# Patient Record
Sex: Male | Born: 1985 | Race: White | Hispanic: Yes | Marital: Single | Smoking: Never smoker
Health system: Southern US, Community
[De-identification: ages and names within clinical notes are randomized; demographics above are authoritative.]

## PROBLEM LIST (undated history)

## (undated) DIAGNOSIS — L709 Acne, unspecified: Secondary | ICD-10-CM

## (undated) DIAGNOSIS — D696 Thrombocytopenia, unspecified: Secondary | ICD-10-CM

## (undated) DIAGNOSIS — L270 Generalized skin eruption due to drugs and medicaments taken internally: Secondary | ICD-10-CM

## (undated) DIAGNOSIS — K760 Fatty (change of) liver, not elsewhere classified: Secondary | ICD-10-CM

## (undated) HISTORY — DX: Fatty (change of) liver, not elsewhere classified: K76.0

## (undated) HISTORY — DX: Acne, unspecified: L70.9

## (undated) HISTORY — DX: Generalized skin eruption due to drugs and medicaments taken internally: L27.0

---

## 2013-06-02 ENCOUNTER — Ambulatory Visit: Payer: Self-pay | Admitting: Physician Assistant

## 2013-06-02 VITALS — BP 135/80 | HR 65 | Temp 98.5°F | Resp 16 | Ht 64.5 in | Wt 148.6 lb

## 2013-06-02 DIAGNOSIS — S39012A Strain of muscle, fascia and tendon of lower back, initial encounter: Secondary | ICD-10-CM

## 2013-06-02 DIAGNOSIS — M5380 Other specified dorsopathies, site unspecified: Secondary | ICD-10-CM

## 2013-06-02 DIAGNOSIS — M545 Low back pain: Secondary | ICD-10-CM

## 2013-06-02 DIAGNOSIS — M62830 Muscle spasm of back: Secondary | ICD-10-CM

## 2013-06-02 DIAGNOSIS — S335XXA Sprain of ligaments of lumbar spine, initial encounter: Secondary | ICD-10-CM

## 2013-06-02 MED ORDER — NAPROXEN 500 MG PO TABS: 500.0000 mg | ORAL_TABLET | Freq: Two times a day (BID) | ORAL | Status: DC

## 2013-06-02 MED ORDER — CYCLOBENZAPRINE HCL 5 MG PO TABS: 5.0000 mg | ORAL_TABLET | Freq: Three times a day (TID) | ORAL | Status: DC | PRN

## 2013-06-02 NOTE — Progress Notes (Signed)
  Subjective:    Patient ID: John Brennan, male    DOB: Apr 06, 1986, 27 y.o.   MRN: [id number]  HPI  Presents with lower right back pain x 1 day. Reports lifting a framed wall at work this morning and had an acute onset of pain during the lifting. Pain has been constant since, located in lumbar spine and to the right. Has not tried any OTC medications. Denies loss of bladder/bowel function, neck pain, pain radiating to legs. No history of injury.   History was obtained using a staff interpretor.  Review of Systems Denies: headache, dizziness, SOB, chest pain, abdominal pain, urinary symptoms.     Objective:   Physical Exam  General: WDWN male, appears stated age in no apparent distress, only speaks Spanish Resp: clear to auscultation anterior and posterior fields bilaterally Cardiac: RRR, no murmurs/rubs/gallops Extremities: TTP over lumbar spine and to the right, ROM of spine is limited by pain, full ROM of neck, full ROM in hip/knee bilaterally, strength 5/5 in hip/knee bilaterally, distal pulses present Neuro: alert & oriented, cranial nerves II-XII intact, patellar and achilles reflexes 2+ Skin: no rashes, no erythema      Assessment & Plan:  Lower back pain - Plan: naproxen (NAPROSYN) 500 MG tablet  Muscle spasm of back - Plan: cyclobenzaprine (FLEXERIL) 5 MG tablet  Strain of lumbar paraspinal muscle, initial encounter - Plan: naproxen (NAPROSYN) 500 MG tablet, cyclobenzaprine (FLEXERIL) 5 MG tablet  Communication barrier - pt does not speak English  Take medications as directed. Ice for 48 hours, then use heat for comfort.  Avoid heavy lifting. Do back exercises to avoid stiffness.  Return in 1 week if symptoms have not improved or sooner for worsening symptoms.

## 2013-06-02 NOTE — Progress Notes (Signed)
I was directly involved with the patient's care.  I examined and agree with the assessment and plan.

## 2013-06-02 NOTE — Patient Instructions (Addendum)
Take medications as direction.  Use ice for 48 hours, then use heat.  Try not to lift heavy items to rest your back.  Keep moving your back so you do not get stiff.  Return in 1 week if you are not better, return sooner if you are worse.

## 2013-06-09 ENCOUNTER — Ambulatory Visit: Payer: Self-pay | Admitting: Physician Assistant

## 2013-06-09 VITALS — BP 129/60 | HR 61 | Temp 98.9°F | Resp 16 | Ht 65.0 in | Wt 134.0 lb

## 2013-06-09 DIAGNOSIS — M545 Low back pain, unspecified: Secondary | ICD-10-CM

## 2013-06-09 DIAGNOSIS — S335XXA Sprain of ligaments of lumbar spine, initial encounter: Secondary | ICD-10-CM

## 2013-06-09 DIAGNOSIS — S39012A Strain of muscle, fascia and tendon of lower back, initial encounter: Secondary | ICD-10-CM

## 2013-06-09 MED ORDER — NAPROXEN 500 MG PO TABS: 500.0000 mg | ORAL_TABLET | Freq: Two times a day (BID) | ORAL | Status: DC

## 2013-06-09 MED ORDER — METHOCARBAMOL 500 MG PO TABS: 500.0000 mg | ORAL_TABLET | Freq: Three times a day (TID) | ORAL | Status: DC | PRN

## 2013-06-10 NOTE — Progress Notes (Signed)
   [address] [place] [address]   Phone [phone]  Subjective:    Patient ID: John Brennan, male    DOB: November 28, 1986, 27 y.o.   MRN: [id number]  HPI Pt presents to clinic for recheck back pain - he feels about 50% better.  He is much better when he is able to take his meds but around the time for his next dose he starts to have pain. He has not worked since last week.  The Flexeril makes him very drowsy.  He is still having no pain radiation, paresthesias or numbness.  The pain is only on the R side at the lumbar area.  Worse in the am after he has stayed still all night sleeping - he has been able to do his normal daily activities without pain.  Staff member interpreted   Review of Systems  Musculoskeletal: Positive for back pain. Negative for gait problem.       Objective:   Physical Exam  Vitals reviewed. Constitutional: He is oriented to person, place, and time. He appears well-developed and well-nourished.  HENT:  Head: Normocephalic and atraumatic.  Right Ear: External ear normal.  Left Ear: External ear normal.  Eyes: Conjunctivae are normal.  Neck: Normal range of motion.  Pulmonary/Chest: Effort normal.  Musculoskeletal:       Lumbar back: He exhibits tenderness and spasm. He exhibits normal range of motion (improved from last visit), no bony tenderness, no swelling and no pain.       Back:  Neurological: He is alert and oriented to person, place, and time. He has normal reflexes.  Skin: Skin is warm and dry.  Psychiatric: He has a normal mood and affect. His behavior is normal. Judgment and thought content normal.        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  We will try Robaxin to see if that helps his muscle spasm but does not cause the same  drowsiness as the Flexeril.  He will continue heat and stretches. Plan: naproxen (NAPROSYN) 500 MG tablet, methocarbamol (ROBAXIN) 500 MG tablet  Strain of lumbar paraspinal muscle, initial encounter - Plan: naproxen (NAPROSYN) 500 MG tablet, methocarbamol (ROBAXIN) 500 MG tablet  Recheck in 1 wk if still having problems.  Benny Lennert PA-C 06/10/2013 9:48 AM

## 2015-03-26 ENCOUNTER — Inpatient Hospital Stay (HOSPITAL_COMMUNITY)
Admission: EM | Admit: 2015-03-26 | Discharge: 2015-03-29 | DRG: 813 | Disposition: A | Discharge status: Home / Self Care | Payer: Self-pay | Attending: Internal Medicine | Admitting: Internal Medicine

## 2015-03-26 ENCOUNTER — Inpatient Hospital Stay (HOSPITAL_COMMUNITY): Payer: Self-pay

## 2015-03-26 ENCOUNTER — Encounter (HOSPITAL_COMMUNITY): Payer: Self-pay

## 2015-03-26 DIAGNOSIS — R7401 Elevation of levels of liver transaminase levels: Secondary | ICD-10-CM

## 2015-03-26 DIAGNOSIS — T380X5A Adverse effect of glucocorticoids and synthetic analogues, initial encounter: Secondary | ICD-10-CM | POA: Diagnosis present

## 2015-03-26 DIAGNOSIS — D696 Thrombocytopenia, unspecified: Secondary | ICD-10-CM | POA: Diagnosis present

## 2015-03-26 DIAGNOSIS — Z79899 Other long term (current) drug therapy: Secondary | ICD-10-CM

## 2015-03-26 DIAGNOSIS — D693 Immune thrombocytopenic purpura: Principal | ICD-10-CM | POA: Diagnosis present

## 2015-03-26 DIAGNOSIS — Z791 Long term (current) use of non-steroidal anti-inflammatories (NSAID): Secondary | ICD-10-CM

## 2015-03-26 DIAGNOSIS — R748 Abnormal levels of other serum enzymes: Secondary | ICD-10-CM

## 2015-03-26 DIAGNOSIS — R740 Nonspecific elevation of levels of transaminase and lactic acid dehydrogenase [LDH]: Secondary | ICD-10-CM

## 2015-03-26 DIAGNOSIS — IMO0002 Reserved for concepts with insufficient information to code with codable children: Secondary | ICD-10-CM

## 2015-03-26 DIAGNOSIS — R229 Localized swelling, mass and lump, unspecified: Secondary | ICD-10-CM

## 2015-03-26 DIAGNOSIS — D72829 Elevated white blood cell count, unspecified: Secondary | ICD-10-CM | POA: Diagnosis present

## 2015-03-26 LAB — CBC WITH DIFFERENTIAL/PLATELET
Basophils Absolute: 0 10*3/uL (ref 0.0–0.1)
Basophils Relative: 0 % (ref 0–1)
EOS ABS: 0.2 10*3/uL (ref 0.0–0.7)
Eosinophils Relative: 2 % (ref 0–5)
HEMATOCRIT: 47.3 % (ref 39.0–52.0)
Hemoglobin: 16.2 g/dL (ref 13.0–17.0)
LYMPHS ABS: 1.2 10*3/uL (ref 0.7–4.0)
LYMPHS PCT: 12 % (ref 12–46)
MCH: 28.6 pg (ref 26.0–34.0)
MCHC: 34.2 g/dL (ref 30.0–36.0)
MCV: 83.6 fL (ref 78.0–100.0)
MONOS PCT: 5 % (ref 3–12)
Monocytes Absolute: 0.5 10*3/uL (ref 0.1–1.0)
NEUTROS ABS: 8.3 10*3/uL — AB (ref 1.7–7.7)
Neutrophils Relative %: 82 % — ABNORMAL HIGH (ref 43–77)
Platelets: <5 10*3/uL — CL (ref 150–400)
RBC: 5.66 MIL/uL (ref 4.22–5.81)
RDW: 13 % (ref 11.5–15.5)
WBC: 10.1 10*3/uL (ref 4.0–10.5)

## 2015-03-26 LAB — COMPREHENSIVE METABOLIC PANEL
ALBUMIN: 4.9 g/dL (ref 3.5–5.2)
ALT: 92 U/L — ABNORMAL HIGH (ref 0–53)
ANION GAP: 9 (ref 5–15)
AST: 49 U/L — AB (ref 0–37)
Alkaline Phosphatase: 60 U/L (ref 39–117)
BUN: 12 mg/dL (ref 6–23)
CALCIUM: 9.9 mg/dL (ref 8.4–10.5)
CHLORIDE: 103 mmol/L (ref 96–112)
CO2: 26 mmol/L (ref 19–32)
CREATININE: 0.92 mg/dL (ref 0.50–1.35)
GFR calc Af Amer: >90 mL/min (ref >90)
GFR calc non Af Amer: >90 mL/min (ref >90)
Glucose, Bld: 124 mg/dL — ABNORMAL HIGH (ref 70–99)
Potassium: 3.7 mmol/L (ref 3.5–5.1)
Sodium: 138 mmol/L (ref 135–145)
TOTAL PROTEIN: 8.4 g/dL — AB (ref 6.0–8.3)
Total Bilirubin: 1.1 mg/dL (ref 0.3–1.2)

## 2015-03-26 LAB — SEDIMENTATION RATE: SED RATE: 3 mm/h (ref 0–16)

## 2015-03-26 LAB — HEPATITIS PANEL, ACUTE
HCV Ab: NEGATIVE
HEP B C IGM: NONREACTIVE
Hep A IgM: NONREACTIVE
Hepatitis B Surface Ag: NEGATIVE

## 2015-03-26 LAB — ABO/RH: ABO/RH(D): O POS

## 2015-03-26 LAB — PROTIME-INR
INR: 1.05 (ref 0.00–1.49)
PROTHROMBIN TIME: 13.8 s (ref 11.6–15.2)

## 2015-03-26 LAB — APTT: aPTT: 35 seconds (ref 24–37)

## 2015-03-26 MED ORDER — IMMUNE GLOBULIN (HUMAN) 10 GM/100ML IV SOLN
400.0000 mg/kg | INTRAVENOUS | Status: DC
  Administered 2015-03-26 – 2015-03-28 (×3): 25 g via INTRAVENOUS
  Filled 2015-03-26 (×3): qty 250

## 2015-03-26 MED ORDER — SODIUM CHLORIDE 0.9 % IV SOLN
Freq: Once | INTRAVENOUS | Status: AC
  Administered 2015-03-26: 10 mL/h via INTRAVENOUS

## 2015-03-26 MED ORDER — ACETAMINOPHEN 650 MG RE SUPP: 650.0000 mg | Freq: Four times a day (QID) | RECTAL | Status: DC | PRN

## 2015-03-26 MED ORDER — ONDANSETRON HCL 4 MG/2ML IJ SOLN: 4.0000 mg | Freq: Four times a day (QID) | INTRAMUSCULAR | Status: DC | PRN

## 2015-03-26 MED ORDER — ACETAMINOPHEN 325 MG PO TABS: 650.0000 mg | ORAL_TABLET | Freq: Four times a day (QID) | ORAL | Status: DC | PRN

## 2015-03-26 MED ORDER — ONDANSETRON HCL 4 MG PO TABS: 4.0000 mg | ORAL_TABLET | Freq: Four times a day (QID) | ORAL | Status: DC | PRN

## 2015-03-26 MED ORDER — DEXAMETHASONE SODIUM PHOSPHATE 4 MG/ML IJ SOLN
40.0000 mg | Freq: Every day | INTRAMUSCULAR | Status: DC
  Administered 2015-03-26: 40 mg via INTRAVENOUS
  Filled 2015-03-26 (×2): qty 10

## 2015-03-26 NOTE — ED Notes (Signed)
Decadron sent back to pharmacy because of wrong dosing.

## 2015-03-26 NOTE — ED Notes (Signed)
[facility] Called. [facility]

## 2015-03-26 NOTE — ED Notes (Signed)
On phone with interpretor to explain need for platelets and to obtain consent.

## 2015-03-26 NOTE — ED Notes (Signed)
[facility] Waldo BingKim Rn to call back.

## 2015-03-26 NOTE — H&P (Signed)
History and Physical  John Brennan CBI:[id number] DOB: [birth year] DOA: 03/26/2015   PCP: No PCP Per Patient   Chief Complaint: Bleeding gums  HPI:  29 year old male with no chronic medical issues presented to the emergency department with 2 week history of bleeding gums. The patient went to urgent care where a CBC was done. The patient was told that he had a platelet count less than 10,000. The patient was called with results and instructed to come to the emergency department. In addition, the patient has noted some bruising on his arms and legs without any recent injury or trauma. He denies any epistaxis, hemoptysis, hematemesis, hematochezia, melena, hematuria. He denies any fevers, chills, chest pain, shortness breath, nausea, vomiting, diarrhea, dysuria. There is no dizziness or syncope. No headaches or visual changes disturbance.  In the emergency department, CBC revealed the patient's platelets were less than 5000. The patient was afebrile and hemodynamically stable. BMP was unremarkable. AST 49, ALT of 92, alk phosphatase 60, total bilirubin 1.1. INR was 1.05, PTT 35. 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 Alvy Bimler who has seen the patient -smear was reviewed by Dr. 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 There was also given for the patient to receive IVIG 1 g/kg 2 days or 400 mg/kg 5 days. -The patient will be transferred to [facility] -d/c naprosyn -transfuse one unit platelets--ordered by Dr. Alvy Bimler -Coags unremarkable -ESR, B12 Transaminasemia -abd US--ordered in ED -viral hepatitis panel         History reviewed. No pertinent past medical history. History reviewed. No pertinent past surgical history. Social History:   reports that he has never smoked. He does not have any smokeless tobacco history on file. He reports that he drinks alcohol. He reports that he does not use illicit drugs.   Family History--reviewed.  No pertinent family history  No Known Allergies    Prior to Admission medications   Medication Sig Start Date End Date Taking? Authorizing Provider  cyclobenzaprine (FLEXERIL) 5 MG tablet Take 1 tablet (5 mg total) by mouth 3 (three) times daily as needed for muscle spasms. 06/02/13   Mancel Bale, PA-C  methocarbamol (ROBAXIN) 500 MG tablet Take 1 tablet (500 mg total) by mouth 3 (three) times daily as needed (for muscle spasms and back pain). 06/09/13   Mancel Bale, PA-C  naproxen (NAPROSYN) 500 MG tablet Take 1 tablet (500 mg total) by mouth 2 (two) times daily with a meal. 06/09/13   Mancel Bale, PA-C    Review of Systems:  Constitutional:  No weight loss, night sweats, Fevers, chills, fatigue.  Head&Eyes: No headache.  No vision loss.  No eye pain or scotoma ENT:  No Difficulty swallowing,Tooth/dental problems,Sore throat,    Cardio-vascular:  No chest pain,  swelling in lower extremities,  dizziness, palpitations  GI:  No  abdominal pain, nausea, vomiting, diarrhea hematochezia, melena, Resp:  No shortness of breath with exertion or at rest. No cough. No coughing up of blood .No wheezing.No chest wall deformity  Skin:  Scattered bruising GU:  no dysuria, change in color of urine, no urgency or frequency. No flank pain.  Musculoskeletal:  No joint pain or swelling.  No back pain.  Psych:   No depression or anxiety. Neurologic: No headache,  no dysesthesia,no vision loss. No syncope  Physical Exam: Filed Vitals:   03/26/15 1628 03/26/15 1630 03/26/15 1645 03/26/15 1700  BP: 120/61 129/64 114/58 122/55  Pulse: 91 78 66 80  Temp: 98.9 F (37.2 C)  98.9 F (37.2 C)   TempSrc: Oral  Oral   Resp: '16  17 16  ' Height:      Weight:      SpO2: 100% 100% 98% 97%    General:  A&O x 3, NAD, nontoxic, pleasant/cooperative Head/Eye: No conjunctival hemorrhage, no icterus, [place]/AT, No nystagmus ENT:  No icterus,  No thrush, good dentition, no pharyngeal exudate Neck:  No masses, no lymphadenpathy, no bruits CV:  RRR, no rub, no gallop, no S3 Lung:  CTAB, good air movement, no wheeze, no rhonchi Abdomen: soft/NT, +BS, nondistended, no peritoneal signs Ext: No cyanosis, No rashes, No petechiae, No lymphangitis, No edema Neuro: CNII-XII intact, strength 4/5 in bilateral upper and lower extremities, no dysmetria  Labs on Admission:  Basic Metabolic Panel:  Recent Labs Lab 03/26/15 1319  NA 138  K 3.7  CL 103  CO2 26  GLUCOSE 124*  BUN 12  CREATININE 0.92  CALCIUM 9.9   Liver Function Tests:  Recent Labs Lab 03/26/15 1319  AST 49*  ALT 92*  ALKPHOS 60  BILITOT 1.1  PROT 8.4*  ALBUMIN 4.9   No results for input(s): LIPASE, AMYLASE in the last 168 hours. No results for input(s): AMMONIA in the last 168 hours. CBC:  Recent Labs Lab 03/26/15 1319  WBC 10.1  NEUTROABS 8.3*  HGB 16.2  HCT 47.3  MCV 83.6  PLT <5*   Cardiac Enzymes: No results for input(s): CKTOTAL, CKMB, CKMBINDEX, TROPONINI in the last 168 hours. BNP: Invalid input(s): POCBNP CBG: No results for input(s): GLUCAP in the last 168 hours.  Radiological Exams on Admission: No results found.      Time spent:60 minutes Code Status:   FULL Family Communication:   Cousin updated on phone   Suzy Kugel, DO  [facility] Pager [phone]  If 7PM-7AM, please contact night-coverage [email or web address] Password [facility] 03/26/2015, 5:17 PM

## 2015-03-26 NOTE — ED Notes (Signed)
[facility] Awaiting IV team to start Immune globulin and also for pt to have [place] before transportation.

## 2015-03-26 NOTE — ED Notes (Signed)
pharmacy called to verify medications.

## 2015-03-26 NOTE — ED Notes (Signed)
MD at bedside. 

## 2015-03-26 NOTE — ED Notes (Signed)
Pt. Developed a toothache on Monday afternoon and went to the clinic today for generalized body ache and was found to have low platelets.  Was sent to us for evaluation and treatment.

## 2015-03-26 NOTE — Consult Note (Signed)
[facility] CONSULT NOTE  Patient Care Team: No Pcp Per Patient as PCP - General (General Practice)  CHIEF COMPLAINTS/PURPOSE OF CONSULTATION:  Severe thrombocytopenia  HISTORY OF PRESENTING ILLNESS:  John Brennan 29 y.o. male is here because of thrombocytopenia. History is obtained via an interpreter.  He was found to have abnormal CBC by PCP due to abnormal bruising and bleeding for the past 2-3 weeks. He complained of gum bleeding. He denies spontaneous epistaxis, hematuria, melena or hematochezia The patient denies history of liver disease, exposure to heparin, history of cardiac murmur/prior cardiovascular surgery or recent new medications He denies prior blood or platelet transfusions Blood count done at the PCP office was less than 10,000, confirmed in the ER. LFT is mildly elevated.  MEDICAL HISTORY:  History reviewed. No pertinent past medical history.  SURGICAL HISTORY: History reviewed. No pertinent past surgical history.  SOCIAL HISTORY: History   Social History  . Marital Status: Single    Spouse Name: N/A  . Number of Children: N/A  . Years of Education: N/A   Occupational History  . Not on file.   Social History Main Topics  . Smoking status: Never Smoker   . Smokeless tobacco: Not on file  . Alcohol Use: Yes     Comment: occassional beer  . Drug Use: No  . Sexual Activity: Not on file   Other Topics Concern  . Not on file   Social History Narrative    FAMILY HISTORY: No family history on file.  ALLERGIES:  has No Known Allergies.  MEDICATIONS:  Current Facility-Administered Medications  Medication Dose Route Frequency Provider Last Rate Last Dose  . dexamethasone (DECADRON) injection 40 mg  40 mg Intravenous Daily Heath Lark, MD   40 mg at 03/26/15 1846  . Immune Globulin 10% SOLN 25 g  400 mg/kg Intravenous Q24H Heath Lark, MD   Stopped at 03/26/15 2102   Current Outpatient Prescriptions  Medication Sig Dispense Refill  .  cyclobenzaprine (FLEXERIL) 5 MG tablet Take 1 tablet (5 mg total) by mouth 3 (three) times daily as needed for muscle spasms. (Patient not taking: Reported on 03/26/2015) 30 tablet 0  . methocarbamol (ROBAXIN) 500 MG tablet Take 1 tablet (500 mg total) by mouth 3 (three) times daily as needed (for muscle spasms and back pain). (Patient not taking: Reported on 03/26/2015) 30 tablet 0  . naproxen (NAPROSYN) 500 MG tablet Take 1 tablet (500 mg total) by mouth 2 (two) times daily with a meal. (Patient not taking: Reported on 03/26/2015) 20 tablet 0    REVIEW OF SYSTEMS:   Constitutional: Denies fevers, chills or abnormal night sweats Eyes: Denies blurriness of vision, double vision or watery eyes Ears, nose, mouth, throat, and face: Denies mucositis or sore throat Respiratory: Denies cough, dyspnea or wheezes Cardiovascular: Denies palpitation, chest discomfort or lower extremity swelling Gastrointestinal:  Denies nausea, heartburn or change in bowel habits Skin: Denies abnormal skin rashes Lymphatics: Denies new lymphadenopathy  Neurological:Denies numbness, tingling or new weaknesses Behavioral/Psych: Mood is stable, no new changes  All other systems were reviewed with the patient and are negative.  PHYSICAL EXAMINATION: ECOG PERFORMANCE STATUS: 0 - Asymptomatic  Filed Vitals:   03/26/15 2100  BP: 135/69  Pulse: 77  Temp:   Resp: 16   Filed Weights   03/26/15 1026  Weight: 140 lb (63.504 kg)    GENERAL:alert, no distress and comfortable SKIN: skin color, texture, turgor are normal, no rashes or significant lesions. Noted some bruising  and petechiae. EYES: normal, conjunctiva are pink and non-injected, sclera clear OROPHARYNX:no exudate, no erythema and lips, buccal mucosa, and tongue normal . He has dried blood on his lips NECK: supple, thyroid normal size, non-tender, without nodularity LYMPH:  no palpable lymphadenopathy in the cervical, axillary or inguinal LUNGS: clear to  auscultation and percussion with normal breathing effort HEART: regular rate & rhythm and no murmurs and no lower extremity edema ABDOMEN:abdomen soft, non-tender and normal bowel sounds Musculoskeletal:no cyanosis of digits and no clubbing  PSYCH: alert & oriented x 3 with fluent speech NEURO: no focal motor/sensory deficits  LABORATORY DATA:  I have reviewed the data as listed Recent Results (from the past 2160 hour(s))  CBC with Differential/Platelet     Status: Abnormal   Collection Time: 03/26/15  1:19 PM  Result Value Ref Range   WBC 10.1 4.0 - 10.5 K/uL   RBC 5.66 4.22 - 5.81 MIL/uL   Hemoglobin 16.2 13.0 - 17.0 g/dL   HCT 47.3 39.0 - 52.0 %   MCV 83.6 78.0 - 100.0 fL   MCH 28.6 26.0 - 34.0 pg   MCHC 34.2 30.0 - 36.0 g/dL   RDW 13.0 11.5 - 15.5 %   Platelets <5 (LL) 150 - 400 K/uL    Comment: SPECIMEN CHECKED FOR CLOTS PLATELET COUNT CONFIRMED BY SMEAR CRITICAL RESULT CALLED TO, READ BACK BY AND VERIFIED WITH: JESSICA EASLEY,RN AT 1454 03/26/15 BY ZBEECH.    Neutrophils Relative % 82 (H) 43 - 77 %   Neutro Abs 8.3 (H) 1.7 - 7.7 K/uL   Lymphocytes Relative 12 12 - 46 %   Lymphs Abs 1.2 0.7 - 4.0 K/uL   Monocytes Relative 5 3 - 12 %   Monocytes Absolute 0.5 0.1 - 1.0 K/uL   Eosinophils Relative 2 0 - 5 %   Eosinophils Absolute 0.2 0.0 - 0.7 K/uL   Basophils Relative 0 0 - 1 %   Basophils Absolute 0.0 0.0 - 0.1 K/uL  Comprehensive metabolic panel     Status: Abnormal   Collection Time: 03/26/15  1:19 PM  Result Value Ref Range   Sodium 138 135 - 145 mmol/L   Potassium 3.7 3.5 - 5.1 mmol/L   Chloride 103 96 - 112 mmol/L   CO2 26 19 - 32 mmol/L   Glucose, Bld 124 (H) 70 - 99 mg/dL   BUN 12 6 - 23 mg/dL   Creatinine, Ser 0.92 0.50 - 1.35 mg/dL   Calcium 9.9 8.4 - 10.5 mg/dL   Total Protein 8.4 (H) 6.0 - 8.3 g/dL   Albumin 4.9 3.5 - 5.2 g/dL   AST 49 (H) 0 - 37 U/L   ALT 92 (H) 0 - 53 U/L   Alkaline Phosphatase 60 39 - 117 U/L   Total Bilirubin 1.1 0.3 - 1.2 mg/dL    GFR calc non Af Amer >90 >90 mL/min   GFR calc Af Amer >90 >90 mL/min    Comment: (NOTE) The eGFR has been calculated using the CKD EPI equation. This calculation has not been validated in all clinical situations. eGFR's persistently <90 mL/min signify possible Chronic Kidney Disease.    Anion gap 9 5 - 15  Protime-INR     Status: None   Collection Time: 03/26/15  1:19 PM  Result Value Ref Range   Prothrombin Time 13.8 11.6 - 15.2 seconds   INR 1.05 0.00 - 1.49  APTT     Status: None   Collection Time: 03/26/15  1:19 PM  Result Value Ref Range   aPTT 35 24 - 37 seconds  ABO/Rh     Status: None   Collection Time: 03/26/15  1:19 PM  Result Value Ref Range   ABO/RH(D) O POS   Prepare Pheresed Platelets     Status: None (Preliminary result)   Collection Time: 03/26/15  3:35 PM  Result Value Ref Range   Unit Number [id number]    Blood Component Type PLTP LR1 PAS    Unit division 00    Status of Unit ISSUED    Transfusion Status OK TO TRANSFUSE   Hepatitis panel, acute     Status: None   Collection Time: 03/26/15  5:07 PM  Result Value Ref Range   Hepatitis B Surface Ag NEGATIVE NEGATIVE   HCV Ab NEGATIVE NEGATIVE   Hep A IgM NON REACTIVE NON REACTIVE    Comment: (NOTE) Effective November 09, 2014, Hepatitis Acute Panel (test code 22940) will be revised to automatically reflex to the Hepatitis C Viral RNA, Quantitative, Real-Time PCR assay if the Hepatitis C antibody screening result is Reactive. This action is being taken to ensure that the CDC/USPSTF recommended HCV diagnostic algorithm with the appropriate test reflex needed for accurate interpretation is followed.    Hep B C IgM NON REACTIVE NON REACTIVE    Comment: (NOTE) High levels of Hepatitis B Core IgM antibody are detectable during the acute stage of Hepatitis B. This antibody is used to differentiate current from past HBV infection. Performed at [facility]   Sedimentation rate     Status: None    Collection Time: 03/26/15  5:07 PM  Result Value Ref Range   Sed Rate 3 0 - 16 mm/hr    RADIOGRAPHIC STUDIES: I have personally reviewed the radiological images as listed and agreed with the findings in the report. US Abdomen Complete  03/26/2015   CLINICAL DATA:  Generalized abdominal pain.  EXAM: ULTRASOUND ABDOMEN COMPLETE  COMPARISON:  None.  FINDINGS: Gallbladder: No gallstones or wall thickening visualized. No sonographic Murphy sign noted.  Common bile duct: Diameter: 3 mm, within normal limits.  Liver: Diffusely increased echogenicity of the hepatic parenchyma, consistent with diffuse hepatic steatosis/hepatocellular disease. A poorly defined hypoechoic lesion is seen in posterior right hepatic lobe which measures 3.5 x 2.4 x 3.0 cm. Differential diagnosis includes hepatic neoplasm and focal fatty sparing.  IVC: No abnormality visualized.  Pancreas: Visualized portion unremarkable.  Spleen: Size and appearance within normal limits.  Right Kidney: Length: 9.7 cm. Echogenicity within normal limits. No mass or hydronephrosis visualized.  Left Kidney: Length: 10.6 cm. Echogenicity within normal limits. No mass or hydronephrosis visualized.  Abdominal aorta: No aneurysm visualized.  Other findings: None.  IMPRESSION: No evidence of gallstones or biliary dilatation.  3.5 cm ill-defined hypoechoic lesion in the posterior right hepatic lobe. Differential diagnosis includes benign focal fatty sparing as well as hepatic neoplasm. Recommend nonemergent abdomen MRI without and with contrast for further characterization.  Diffuse hepatic steatosis/ hepatocellular disease. Consider nonemergent hepatic elastography ultrasound exam for non-invasive risk assessment for hepatic fibrosis/cirrhosis.   Electronically Signed   By: Earle Gell M.D.   On: 03/26/2015 20:45   I reviewed his blood smear. Normal WBC and RBC morphology. There is absolute thrombocytopenia with no clumping. No schistocytes are  seen. ASSESSMENT & PLAN  Severe thrombocytopenia I will order additional work-up to exclude autoimmune disease, viral infection or vitamin deficiency. 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  We discussed some of the risks, benefits, and  alternatives of platelets transfusions. The patient is symptomatic from low platelet counts with bruising/bleeding/at high risk of life-threatening bleeding and the platelet count is critically low.  Some of the side-effects to be expected including risks of transfusion reactions, chills, infection, syndrome of volume overload and risk of hospitalization from various reasons and the patient is willing to proceed and went ahead to sign consent today. I also discussed the use of IVIG (462m/kg X 5 days) and steroid treatment (IV Dex X 4 days) with the patient and he agreed to proceed  Elevated liver enzymes He denies excessive alcoholism. Viral screen is negative. I ordered [place]. Result suspicious of neoplasm. Recommend further imaging study tomorrow. I doubt this is the cause of his severe thrombocytopenia  [facility]charge planning [facility] Please check daily platelet count. I will return on Monday to check on him Please call consult service if questions arise.  [facility] MD 03/26/2015 9:25 PM

## 2015-03-26 NOTE — ED Provider Notes (Signed)
CSN: [id number]     Arrival date & time 03/26/15  1021 History   First MD Initiated Contact with Patient 03/26/15 1252     No chief complaint on file.    (Consider location/radiation/quality/duration/timing/severity/associated sxs/prior Treatment) HPI Comments: Patient presents today after being called by his doctor telling him that his platelet count was critically low.  He presents today with paperwork showing a platelet count of less than 10.  He reports that he was seen by his doctor yesterday because he has been having gingival bleeding for the past 2 weeks.  They did the lab work yesterday and he was called with the results today.  He also reports that he has been noticing bruising all over his body for no particular reasons.  No injury.  He denies any nausea, vomiting, diarrhea, abdominal pain, melena, or hematochezia.  He denies any known history of bleeding disorder.  No other known medical problems.  No family history of bleeding disorders.  Denies any recent illness.  He moved to the [place] 7 years ago and reports that he does not have a PCP that he routinely sees.  The history is provided by the patient. A language interpreter was used (phone interpreter used).    History reviewed. No pertinent past medical history. History reviewed. No pertinent past surgical history. No family history on file. History  Substance Use Topics  . Smoking status: Never Smoker   . Smokeless tobacco: Not on file  . Alcohol Use: Yes     Comment: occassional beer    Review of Systems  Hematological: Bruises/bleeds easily.  All other systems reviewed and are negative.     Allergies  Review of patient's allergies indicates no known allergies.  Home Medications   Prior to Admission medications   Medication Sig Start Date End Date Taking? Authorizing Provider  cyclobenzaprine (FLEXERIL) 5 MG tablet Take 1 tablet (5 mg total) by mouth 3 (three) times daily as needed for muscle spasms.  06/02/13   Morrell Riddle, PA-C  methocarbamol (ROBAXIN) 500 MG tablet Take 1 tablet (500 mg total) by mouth 3 (three) times daily as needed (for muscle spasms and back pain). 06/09/13   Morrell Riddle, PA-C  naproxen (NAPROSYN) 500 MG tablet Take 1 tablet (500 mg total) by mouth 2 (two) times daily with a meal. 06/09/13   Morrell Riddle, PA-C   BP 145/77 mmHg  Pulse 63  Temp(Src) 98.7 F (37.1 C) (Oral)  Resp 17  Ht 5' (1.524 m)  Wt 140 lb (63.504 kg)  BMI 27.34 kg/m2  SpO2 100% Physical Exam  Constitutional: He appears well-developed and well-nourished.  HENT:  Head: Normocephalic and atraumatic.  Small amount of bleeding of the upper gingiva.  Small hematoma of the left buccal mucosa.   Petechiae of the soft palate  Neck: Normal range of motion. Neck supple.  Cardiovascular: Normal rate, regular rhythm and normal heart sounds.   Pulmonary/Chest: Effort normal and breath sounds normal.  Abdominal: Soft. Bowel sounds are normal. He exhibits no distension and no mass. There is no tenderness. There is no rebound and no guarding.  Musculoskeletal: Normal range of motion.  Neurological: He is alert.  Skin: Skin is warm and dry. Bruising and petechiae noted.  Scattered bruises of lower extremities bilaterally and bruise of the chest.  Several small petechiae of the anterior thigh bilaterally  Psychiatric: He has a normal mood and affect.  Nursing note and vitals reviewed.   ED Course  Procedures (  including critical care time) Labs Review Labs Reviewed  CBC WITH DIFFERENTIAL/PLATELET  COMPREHENSIVE METABOLIC PANEL  PROTIME-INR  APTT    Imaging Review No results found.   EKG Interpretation None     3:32 PM Discussed with Dr. Bertis RuddyGorsuch with Hematology/Oncology.  She recommends ordering the patient 1 Unit of Platelets and [facility]. 4:45 PM Discussed with Dr. Arbutus Leasat with [facility] who reports that he will see the patient [facility].   MDM   Final  diagnoses:  None   Patient presents today with gingival bleeding for the past 2 weeks.  Platelet count found to be less than 5.  Remainder of labs unremarkable aside from mildly elevated LFT's.  Dr. Bertis RuddyGorsuch with Hem/Onc consulted and recommended transfusion of 1 Unit Platelets and [facility].  [facility] agreed to admit the patient.  Patient stable at time of transfer.    Santiago GladHeather Kensi Karr, PA-C 03/28/15 [id number]  Cathren LaineKevin Steinl, MD 03/29/15 [phone]

## 2015-03-26 NOTE — ED Notes (Signed)
US called. Will come to bed side.

## 2015-03-26 NOTE — ED Notes (Signed)
[facility]

## 2015-03-27 LAB — COMPREHENSIVE METABOLIC PANEL
ALK PHOS: 52 U/L (ref 39–117)
ALT: 78 U/L — ABNORMAL HIGH (ref 0–53)
AST: 39 U/L — ABNORMAL HIGH (ref 0–37)
Albumin: 4.9 g/dL (ref 3.5–5.2)
Anion gap: 8 (ref 5–15)
BILIRUBIN TOTAL: 0.7 mg/dL (ref 0.3–1.2)
BUN: 15 mg/dL (ref 6–23)
CHLORIDE: 103 mmol/L (ref 96–112)
CO2: 24 mmol/L (ref 19–32)
Calcium: 9.2 mg/dL (ref 8.4–10.5)
Creatinine, Ser: 0.79 mg/dL (ref 0.50–1.35)
GFR calc Af Amer: >90 mL/min (ref >90)
Glucose, Bld: 143 mg/dL — ABNORMAL HIGH (ref 70–99)
POTASSIUM: 4.4 mmol/L (ref 3.5–5.1)
SODIUM: 135 mmol/L (ref 135–145)
Total Protein: 9.3 g/dL — ABNORMAL HIGH (ref 6.0–8.3)

## 2015-03-27 LAB — PREPARE PLATELET PHERESIS: UNIT DIVISION: 0

## 2015-03-27 LAB — CBC WITH DIFFERENTIAL/PLATELET
BASOS ABS: 0 10*3/uL (ref 0.0–0.1)
Basophils Relative: 0 % (ref 0–1)
Eosinophils Absolute: 0 10*3/uL (ref 0.0–0.7)
Eosinophils Relative: 0 % (ref 0–5)
HCT: 45.5 % (ref 39.0–52.0)
Hemoglobin: 15.8 g/dL (ref 13.0–17.0)
LYMPHS ABS: 0.9 10*3/uL (ref 0.7–4.0)
Lymphocytes Relative: 10 % — ABNORMAL LOW (ref 12–46)
MCH: 28.9 pg (ref 26.0–34.0)
MCHC: 34.7 g/dL (ref 30.0–36.0)
MCV: 83.3 fL (ref 78.0–100.0)
Monocytes Absolute: 0.1 10*3/uL (ref 0.1–1.0)
Monocytes Relative: 1 % — ABNORMAL LOW (ref 3–12)
NEUTROS ABS: 8 10*3/uL — AB (ref 1.7–7.7)
NEUTROS PCT: 89 % — AB (ref 43–77)
Platelets: 11 10*3/uL — CL (ref 150–400)
RBC: 5.46 MIL/uL (ref 4.22–5.81)
RDW: 12.8 % (ref 11.5–15.5)
WBC: 9 10*3/uL (ref 4.0–10.5)

## 2015-03-27 LAB — VITAMIN B12: Vitamin B-12: 575 pg/mL (ref 211–911)

## 2015-03-27 LAB — HIV ANTIBODY (ROUTINE TESTING W REFLEX): HIV Screen 4th Generation wRfx: NONREACTIVE

## 2015-03-27 MED ORDER — PANTOPRAZOLE SODIUM 40 MG PO TBEC
40.0000 mg | DELAYED_RELEASE_TABLET | Freq: Every day | ORAL | Status: DC
  Administered 2015-03-27 – 2015-03-29 (×3): 40 mg via ORAL
  Filled 2015-03-27 (×3): qty 1

## 2015-03-27 MED ORDER — DEXAMETHASONE SODIUM PHOSPHATE 10 MG/ML IJ SOLN
40.0000 mg | Freq: Every day | INTRAMUSCULAR | Status: AC
  Administered 2015-03-27 – 2015-03-29 (×3): 40 mg via INTRAVENOUS
  Filled 2015-03-27 (×3): qty 4

## 2015-03-27 NOTE — Progress Notes (Signed)
Patient Demographics  John Brennan, is a 29 y.o. male, DOB - 04/13/1986, GBM:[id number]  Admit date - 03/26/2015   Admitting Physician [name], MD  Outpatient Primary MD for the patient is No PCP Per Patient  LOS - 1   No chief complaint on file.     Admission history of present illness/brief narrative: 29 year old male with no chronic medical issues presente with 2 week history of bleeding gums. The patient went to urgent care where a CBC was done. The patient was told that he had a platelet count less than 10,000. The patient was called with results and instructed to come to the emergency department. In addition, the patient has noted some bruising on his arms and legs without any recent injury or trauma. He denies any epistaxis, hemoptysis, hematemesis, hematochezia, melena, hematuria. He denies any fevers, chills, chest pain, shortness breath, nausea, vomiting, diarrhea, dysuria. There is no dizziness or syncope. No headaches or visual changes disturbance.  In the emergency department, CBC revealed the patient's platelets were less than 5000. The patient was afebrile and hemodynamically stable. BMP was unremarkable. AST 49, ALT of 92, alk phosphatase 60, total bilirubin 1.1. INR was 1.05, PTT 35. 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  Subjective:   John Brennan today has, No headache, No chest pain, No abdominal pain - No Nausea, No new weakness tingling or numbness, No Cough - SOB.   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 - HIV nonreactive  Transaminitis - Hepatitis panel negative, LFTs improving. - Will follow on MRI liver giving abnormal finding in the liver on ultrasound abdomen.  Code Status: Full  Family Communication: Drains at bedside  Disposition Plan: Home when stable   Procedures     Consults   Hematology oncology   Medications  Scheduled Meds: . dexamethasone  40 mg Intravenous Daily  . IMMUNE GLOBULIN 10% (HUMAN) IV - For Fluid Restriction Only  400 mg/kg Intravenous Q24H  . pantoprazole  40 mg Oral Daily   Continuous Infusions:  PRN Meds:.acetaminophen **OR** acetaminophen, ondansetron **OR** ondansetron (ZOFRAN) IV  DVT Prophylaxis  SCDs given severe thrombocytopenia  Lab Results  Component Value Date   PLT 11* 03/27/2015    Antibiotics    Anti-infectives    None          Objective:   Filed Vitals:   03/27/15 0532 03/27/15 0950 03/27/15 1515 03/27/15 1630  BP: 130/58 124/78 130/64 118/70  Pulse: 71 73 77 66  Temp: 97.5 F (36.4 C) 98 F (36.7 C) 98.1 F (36.7 C) 98.2 F (36.8 C)  TempSrc: Oral Oral Oral Oral  Resp: '18 18 16 16  ' Height:      Weight: 63.005 kg (138 lb 14.4 oz)     SpO2: 100% 100% 99% 100%    Wt Readings from Last 3 Encounters:  03/27/15 63.005  kg (138 lb 14.4 oz)  06/09/13 60.782 kg (134 lb)  06/02/13 67.405 kg (148 lb 9.6 oz)     Intake/Output Summary (Last 24 hours) at 03/27/15 1719 Last data filed at 03/27/15 1230  Gross per 24 hour  Intake    600 ml  Output   1050 ml  Net   -450 ml     Physical Exam  Awake Alert, Oriented X 3, No new F.N deficits, Normal affect [place].AT,PERRAL Supple Neck,No JVD, No cervical lymphadenopathy appriciated.  Symmetrical Chest wall movement, Good air movement bilaterally, CTAB RRR,No Gallops,Rubs or new Murmurs, No Parasternal Heave +ve B.Sounds, Abd Soft, No tenderness, No organomegaly appriciated, No rebound -  guarding or rigidity. No Cyanosis, Clubbing or edema,    Data Review   Micro Results No results found for this or any previous visit (from the past 240 hour(s)).  Radiology Reports US Abdomen Complete  03/26/2015   CLINICAL DATA:  Generalized abdominal pain.  EXAM: ULTRASOUND ABDOMEN COMPLETE  COMPARISON:  None.  FINDINGS: Gallbladder: No gallstones or wall thickening visualized. No sonographic Murphy sign noted.  Common bile duct: Diameter: 3 mm, within normal limits.  Liver: Diffusely increased echogenicity of the hepatic parenchyma, consistent with diffuse hepatic steatosis/hepatocellular disease. A poorly defined hypoechoic lesion is seen in posterior right hepatic lobe which measures 3.5 x 2.4 x 3.0 cm. Differential diagnosis includes hepatic neoplasm and focal fatty sparing.  IVC: No abnormality visualized.  Pancreas: Visualized portion unremarkable.  Spleen: Size and appearance within normal limits.  Right Kidney: Length: 9.7 cm. Echogenicity within normal limits. No mass or hydronephrosis visualized.  Left Kidney: Length: 10.6 cm. Echogenicity within normal limits. No mass or hydronephrosis visualized.  Abdominal aorta: No aneurysm visualized.  Other findings: None.  IMPRESSION: No evidence of gallstones or biliary dilatation.  3.5 cm ill-defined hypoechoic lesion in the posterior right hepatic lobe. Differential diagnosis includes benign focal fatty sparing as well as hepatic neoplasm. Recommend nonemergent abdomen MRI without and with contrast for further characterization.  Diffuse hepatic steatosis/ hepatocellular disease. Consider nonemergent hepatic elastography ultrasound exam for non-invasive risk assessment for hepatic fibrosis/cirrhosis.   Electronically Signed   By: Earle Gell M.D.   On: 03/26/2015 20:45    CBC  Recent Labs Lab 03/26/15 1319 03/27/15 0510  WBC 10.1 9.0  HGB 16.2 15.8  HCT 47.3 45.5  PLT <5* 11*  MCV 83.6 83.3  MCH 28.6 28.9  MCHC 34.2 34.7  RDW 13.0 12.8    LYMPHSABS 1.2 0.9  MONOABS 0.5 0.1  EOSABS 0.2 0.0  BASOSABS 0.0 0.0    Chemistries   Recent Labs Lab 03/26/15 1319 03/27/15 0444  NA 138 135  K 3.7 4.4  CL 103 103  CO2 26 24  GLUCOSE 124* 143*  BUN 12 15  CREATININE 0.92 0.79  CALCIUM 9.9 9.2  AST 49* 39*  ALT 92* 78*  ALKPHOS 60 52  BILITOT 1.1 0.7   ------------------------------------------------------------------------------------------------------------------ estimated creatinine clearance is 109.3 mL/min (by C-G formula based on Cr of 0.79). ------------------------------------------------------------------------------------------------------------------ No results for input(s): HGBA1C in the last 72 hours. ------------------------------------------------------------------------------------------------------------------ No results for input(s): CHOL, HDL, LDLCALC, TRIG, CHOLHDL, LDLDIRECT in the last 72 hours. ------------------------------------------------------------------------------------------------------------------ No results for input(s): TSH, T4TOTAL, T3FREE, THYROIDAB in the last 72 hours.  Invalid input(s): FREET3 ------------------------------------------------------------------------------------------------------------------  Recent Labs  03/26/15 1707  VITAMINB12 575    Coagulation profile  Recent Labs Lab 03/26/15 1319  INR 1.05    No results for input(s): DDIMER in the last 72  hours.  Cardiac Enzymes No results for input(s): CKMB, TROPONINI, MYOGLOBIN in the last 168 hours.  Invalid input(s): CK ------------------------------------------------------------------------------------------------------------------ Invalid input(s): POCBNP     Time Spent in minutes   35 minutes   John Brennan M.D on 03/27/2015 at 5:19 PM  Between 7am to 7pm - Pager - [phone]  After 7pm go to [email or web address] - password [email or web address]  And look for the night coverage person covering for me after  hours  [facility] Office  [phone]   **Disclaimer: This note may have been dictated with voice recognition software. Similar sounding words can inadvertently be transcribed and this note may contain transcription errors which may not have been corrected upon publication of note.**

## 2015-03-27 NOTE — Progress Notes (Signed)
Critical lab value, platelets=11. On call provider notified. No new orders at this time. Will continue to monitor.

## 2015-03-27 NOTE — Progress Notes (Signed)
Arik Husmann   DOB:1986/03/24   ZO#:[id number]   JWJ#:[id number]  Subjective: patient feels well; has not had any further bleeding problems; denies rash, rigors, SOB; friend in room   Objective: young Hispanic man examined in bed Filed Vitals:   03/27/15 0532  BP: 130/58  Pulse: 71  Temp: 97.5 F (36.4 C)  Resp: 18    Body mass index is 27.13 kg/(m^2).  Intake/Output Summary (Last 24 hours) at 03/27/15 1029 Last data filed at 03/27/15 0139  Gross per 24 hour  Intake      0 ml  Output    800 ml  Net   -800 ml     Sclerae unicteric  Oropharynx clear  No peripheral adenopathy  Lungs clear -- no rales or rhonchi  Heart regular rate and rhythm  Abdomen no masses palpated, +BS  MSK no joint edema  Neuro nonfocal   CBG (last 3)  No results for input(s): GLUCAP in the last 72 hours.   Labs:  Lab Results  Component Value Date   WBC 9.0 03/27/2015   HGB 15.8 03/27/2015   HCT 45.5 03/27/2015   MCV 83.3 03/27/2015   PLT 11* 03/27/2015   NEUTROABS 8.0* 03/27/2015    @  Urine Studies No results for input(s): UHGB, CRYS in the last 72 hours.  Invalid input(s): UACOL, UAPR, USPG, UPH, UTP, UGL, UKET, UBIL, UNIT, UROB, ULEU, UEPI, UWBC, URBC, UBAC, CAST, UCOM, BILUA  Basic Metabolic Panel:  Recent Labs Lab 03/26/15 1319 03/27/15 0444  NA 138 135  K 3.7 4.4  CL 103 103  CO2 26 24  GLUCOSE 124* 143*  BUN 12 15  CREATININE 0.92 0.79  CALCIUM 9.9 9.2   GFR Estimated Creatinine Clearance: 109.3 mL/min (by C-G formula based on Cr of 0.79). Liver Function Tests:  Recent Labs Lab 03/26/15 1319 03/27/15 0444  AST 49* 39*  ALT 92* 78*  ALKPHOS 60 52  BILITOT 1.1 0.7  PROT 8.4* 9.3*  ALBUMIN 4.9 4.9   No results for input(s): LIPASE, AMYLASE in the last 168 hours. No results for input(s): AMMONIA in the last 168 hours. Coagulation profile  Recent Labs Lab 03/26/15 1319  INR 1.05    CBC:  Recent Labs Lab 03/26/15 1319 03/27/15 0510   WBC 10.1 9.0  NEUTROABS 8.3* 8.0*  HGB 16.2 15.8  HCT 47.3 45.5  MCV 83.6 83.3  PLT <5* 11*   Cardiac Enzymes: No results for input(s): CKTOTAL, CKMB, CKMBINDEX, TROPONINI in the last 168 hours. BNP: Invalid input(s): POCBNP CBG: No results for input(s): GLUCAP in the last 168 hours. D-Dimer No results for input(s): DDIMER in the last 72 hours. Hgb A1c No results for input(s): HGBA1C in the last 72 hours. Lipid Profile No results for input(s): CHOL, HDL, LDLCALC, TRIG, CHOLHDL, LDLDIRECT in the last 72 hours. Thyroid function studies No results for input(s): TSH, T4TOTAL, T3FREE, THYROIDAB in the last 72 hours.  Invalid input(s): FREET3 Anemia work up  Recent Labs  03/26/15 1707  VITAMINB12 575   Microbiology No results found for this or any previous visit (from the past 240 hour(s)).    Studies:  US Abdomen Complete  03/26/2015   CLINICAL DATA:  Generalized abdominal pain.  EXAM: ULTRASOUND ABDOMEN COMPLETE  COMPARISON:  None.  FINDINGS: Gallbladder: No gallstones or wall thickening visualized. No sonographic Murphy sign noted.  Common bile duct: Diameter: 3 mm, within normal limits.  Liver: Diffusely increased echogenicity of the hepatic parenchyma, consistent with diffuse hepatic steatosis/hepatocellular disease.  A poorly defined hypoechoic lesion is seen in posterior right hepatic lobe which measures 3.5 x 2.4 x 3.0 cm. Differential diagnosis includes hepatic neoplasm and focal fatty sparing.  IVC: No abnormality visualized.  Pancreas: Visualized portion unremarkable.  Spleen: Size and appearance within normal limits.  Right Kidney: Length: 9.7 cm. Echogenicity within normal limits. No mass or hydronephrosis visualized.  Left Kidney: Length: 10.6 cm. Echogenicity within normal limits. No mass or hydronephrosis visualized.  Abdominal aorta: No aneurysm visualized.  Other findings: None.  IMPRESSION: No evidence of gallstones or biliary dilatation.  3.5 cm ill-defined  hypoechoic lesion in the posterior right hepatic lobe. Differential diagnosis includes benign focal fatty sparing as well as hepatic neoplasm. Recommend nonemergent abdomen MRI without and with contrast for further characterization.  Diffuse hepatic steatosis/ hepatocellular disease. Consider nonemergent hepatic elastography ultrasound exam for non-invasive risk assessment for hepatic fibrosis/cirrhosis.   Electronically Signed   By: Myles RosenthalJohn  Stahl M.D.   On: 03/26/2015 20:45    Assessment: 29 y.o. [place] speaker [place], admitted with severe thrombocytopenia most c/w ITP  See Dr Maxine GlennGorsuch's note for discussion. The patient was started on IVIG 03/26/2015. He started dexamethasone 03/27/2015  Plan: Mr Lorie ApleyCastro is tolerating treatment well and specifically has not had a reaction to the IVIG. His platelet count is >10K today. If his platelet count is >20K Monday he could be discharged home on prednisone 60 mg po daily with follow-up w Dr Bertis RuddyGorsuch to be arranged.  It is difficutl to know if the findings on abdominal US are significant. Will write for a liver MRI to resolve issue.  Please let us know if we can be of further help.   Lowella DellMAGRINAT,Avelina Mcclurkin C, MD 03/27/2015  10:29 AM Medical Oncology and Hematology [facility] [address] [place], [place] [address] Tel. [phone]    Fax. [phone]

## 2015-03-27 NOTE — Progress Notes (Signed)
Patient speaks very little english. All of admission information was not completed.

## 2015-03-28 ENCOUNTER — Inpatient Hospital Stay (HOSPITAL_COMMUNITY): Payer: Self-pay

## 2015-03-28 LAB — CBC WITH DIFFERENTIAL/PLATELET
BASOS PCT: 0 % (ref 0–1)
Basophils Absolute: 0 10*3/uL (ref 0.0–0.1)
Eosinophils Absolute: 0 10*3/uL (ref 0.0–0.7)
Eosinophils Relative: 0 % (ref 0–5)
HEMATOCRIT: 42.3 % (ref 39.0–52.0)
Hemoglobin: 14.1 g/dL (ref 13.0–17.0)
LYMPHS PCT: 7 % — AB (ref 12–46)
Lymphs Abs: 1.4 10*3/uL (ref 0.7–4.0)
MCH: 28 pg (ref 26.0–34.0)
MCHC: 33.3 g/dL (ref 30.0–36.0)
MCV: 84.1 fL (ref 78.0–100.0)
MONO ABS: 1.4 10*3/uL — AB (ref 0.1–1.0)
Monocytes Relative: 7 % (ref 3–12)
Neutro Abs: 16.7 10*3/uL — ABNORMAL HIGH (ref 1.7–7.7)
Neutrophils Relative %: 86 % — ABNORMAL HIGH (ref 43–77)
Platelets: 41 10*3/uL — ABNORMAL LOW (ref 150–400)
RBC: 5.03 MIL/uL (ref 4.22–5.81)
RDW: 12.9 % (ref 11.5–15.5)
WBC: 19.5 10*3/uL — ABNORMAL HIGH (ref 4.0–10.5)

## 2015-03-28 LAB — COMPREHENSIVE METABOLIC PANEL
ALBUMIN: 4.3 g/dL (ref 3.5–5.2)
ALT: 59 U/L — AB (ref 0–53)
ANION GAP: 8 (ref 5–15)
AST: 25 U/L (ref 0–37)
Alkaline Phosphatase: 52 U/L (ref 39–117)
BUN: 21 mg/dL (ref 6–23)
CO2: 24 mmol/L (ref 19–32)
CREATININE: 0.81 mg/dL (ref 0.50–1.35)
Calcium: 9.3 mg/dL (ref 8.4–10.5)
Chloride: 104 mmol/L (ref 96–112)
GFR calc Af Amer: >90 mL/min (ref >90)
GLUCOSE: 124 mg/dL — AB (ref 70–99)
Potassium: 4.1 mmol/L (ref 3.5–5.1)
Sodium: 136 mmol/L (ref 135–145)
Total Bilirubin: 0.4 mg/dL (ref 0.3–1.2)
Total Protein: 8.6 g/dL — ABNORMAL HIGH (ref 6.0–8.3)

## 2015-03-28 LAB — GLUCOSE, CAPILLARY
GLUCOSE-CAPILLARY: 146 mg/dL — AB (ref 70–99)
Glucose-Capillary: 151 mg/dL — ABNORMAL HIGH (ref 70–99)
Glucose-Capillary: 99 mg/dL (ref 70–99)

## 2015-03-28 MED ORDER — GADOBENATE DIMEGLUMINE 529 MG/ML IV SOLN
12.0000 mL | Freq: Once | INTRAVENOUS | Status: AC | PRN
  Administered 2015-03-28: 12 mL via INTRAVENOUS

## 2015-03-28 NOTE — Progress Notes (Signed)
Patient Demographics  John Brennan, is a 29 y.o. male, DOB - 1985-12-29, TGY:[id number]  Admit date - 03/26/2015   Admitting Physician [name], MD  Outpatient Primary MD for the patient is No PCP Per Patient  LOS - 2   No chief complaint on file.     Admission history of present illness/brief narrative: 29 year old male with no chronic medical issues presente with 2 week history of bleeding gums. The patient went to urgent care where a CBC was done. The patient was told that he had a platelet count less than 10,000. The patient was called with results and instructed to come to the emergency department. In addition, the patient has noted some bruising on his arms and legs without any recent injury or trauma. He denies any epistaxis, hemoptysis, hematemesis, hematochezia, melena, hematuria. He denies any fevers, chills, chest pain, shortness breath, nausea, vomiting, diarrhea, dysuria. There is no dizziness or syncope. No headaches or visual changes disturbance.  In the emergency department, CBC revealed the patient's platelets were less than 5000. The patient was afebrile and hemodynamically stable. BMP was unremarkable. AST 49, ALT of 92, alk phosphatase 60, total bilirubin 1.1. INR was 1.05, PTT 35. 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  Subjective:   Charleton Idrovo today has, No headache, No chest pain, No abdominal pain - No Nausea, No new weakness tingling or numbness, No Cough - SOB.   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 - Platelet count significantly improved - HIV nonreactive  Transaminitis - Hepatitis panel negative, LFTs improving. - Will follow on MRI liver giving abnormal finding in the liver on ultrasound abdomen.  Leukocytosis - Weighted to steroids.    Code Status: Full  Family Communication: None at bedside  Disposition Plan: Home when stable   Procedures     Consults   Hematology oncology   Medications  Scheduled Meds: . dexamethasone  40 mg Intravenous Daily  . IMMUNE GLOBULIN 10% (HUMAN) IV - For Fluid Restriction Only  400 mg/kg Intravenous Q24H  . pantoprazole  40 mg Oral Daily   Continuous Infusions:  PRN Meds:.acetaminophen **OR** acetaminophen, ondansetron **OR** ondansetron (ZOFRAN) IV  DVT Prophylaxis  SCDs given severe thrombocytopenia , so patient is ambulatory. Lab Results  Component Value Date   PLT 41* 03/28/2015    Antibiotics    Anti-infectives    None          Objective:   Filed Vitals:   03/27/15 1818 03/27/15 1851 03/27/15 1919 03/28/15 0526  BP: 138/62 144/71 140/72 128/70  Pulse: 68 75 75 56  Temp: 98 F (36.7 C) 97.9 F (36.6 C) 98 F (36.7 C) 97.7 F (36.5 C)  TempSrc: Oral Oral Oral Oral  Resp: _0 Height:      Weight:  SpO2: 100% 100% 100% 98%    Wt Readings from Last 3 Encounters:  03/27/15 63.005 kg (138 lb 14.4 oz)  06/09/13 60.782 kg (134 lb)  06/02/13 67.405 kg (148 lb 9.6 oz)     Intake/Output Summary (Last 24 hours) at 03/28/15 1232 Last data filed at 03/28/15 0900  Gross per 24 hour  Intake    240 ml  Output    500 ml  Net   -260 ml     Physical Exam  Awake Alert, Oriented X 3, No new F.N deficits, Normal affect [place].AT,PERRAL Supple Neck,No JVD, No cervical lymphadenopathy appriciated.  Symmetrical Chest wall movement, Good air movement bilaterally, CTAB RRR,No Gallops,Rubs or new Murmurs, No  Parasternal Heave +ve B.Sounds, Abd Soft, No tenderness, No organomegaly appriciated, No rebound - guarding or rigidity. No Cyanosis, Clubbing or edema,    Data Review   Micro Results No results found for this or any previous visit (from the past 240 hour(s)).  Radiology Reports US Abdomen Complete  03/26/2015   CLINICAL DATA:  Generalized abdominal pain.  EXAM: ULTRASOUND ABDOMEN COMPLETE  COMPARISON:  None.  FINDINGS: Gallbladder: No gallstones or wall thickening visualized. No sonographic Murphy sign noted.  Common bile duct: Diameter: 3 mm, within normal limits.  Liver: Diffusely increased echogenicity of the hepatic parenchyma, consistent with diffuse hepatic steatosis/hepatocellular disease. A poorly defined hypoechoic lesion is seen in posterior right hepatic lobe which measures 3.5 x 2.4 x 3.0 cm. Differential diagnosis includes hepatic neoplasm and focal fatty sparing.  IVC: No abnormality visualized.  Pancreas: Visualized portion unremarkable.  Spleen: Size and appearance within normal limits.  Right Kidney: Length: 9.7 cm. Echogenicity within normal limits. No mass or hydronephrosis visualized.  Left Kidney: Length: 10.6 cm. Echogenicity within normal limits. No mass or hydronephrosis visualized.  Abdominal aorta: No aneurysm visualized.  Other findings: None.  IMPRESSION: No evidence of gallstones or biliary dilatation.  3.5 cm ill-defined hypoechoic lesion in the posterior right hepatic lobe. Differential diagnosis includes benign focal fatty sparing as well as hepatic neoplasm. Recommend nonemergent abdomen MRI without and with contrast for further characterization.  Diffuse hepatic steatosis/ hepatocellular disease. Consider nonemergent hepatic elastography ultrasound exam for non-invasive risk assessment for hepatic fibrosis/cirrhosis.   Electronically Signed   By: Earle Gell M.D.   On: 03/26/2015 20:45    CBC  Recent Labs Lab 03/26/15 1319 03/27/15 0510 03/28/15 0450  WBC 10.1  9.0 19.5*  HGB 16.2 15.8 14.1  HCT 47.3 45.5 42.3  PLT <5* 11* 41*  MCV 83.6 83.3 84.1  MCH 28.6 28.9 28.0  MCHC 34.2 34.7 33.3  RDW 13.0 12.8 12.9  LYMPHSABS 1.2 0.9 1.4  MONOABS 0.5 0.1 1.4*  EOSABS 0.2 0.0 0.0  BASOSABS 0.0 0.0 0.0    Chemistries   Recent Labs Lab 03/26/15 1319 03/27/15 0444 03/28/15 0450  NA 138 135 136  K 3.7 4.4 4.1  CL 103 103 104  CO2 _0 GLUCOSE 124* 143* 124*  BUN _1 CREATININE 0.92 0.79 0.81  CALCIUM 9.9 9.2 9.3  AST 49* 39* 25  ALT 92* 78* 59*  ALKPHOS 60 52 52  BILITOT 1.1 0.7 0.4   ------------------------------------------------------------------------------------------------------------------ estimated creatinine clearance is 107.9 mL/min (by C-G formula based on Cr of 0.81). ------------------------------------------------------------------------------------------------------------------ No results for input(s): HGBA1C in the last 72 hours. ------------------------------------------------------------------------------------------------------------------ No results for input(s): CHOL, HDL, LDLCALC, TRIG, CHOLHDL, LDLDIRECT in the last 72 hours. ------------------------------------------------------------------------------------------------------------------ No results for input(s): TSH, T4TOTAL, T3FREE, THYROIDAB  in the last 72 hours.  Invalid input(s): FREET3 ------------------------------------------------------------------------------------------------------------------  Recent Labs  03/26/15 1707  VITAMINB12 575    Coagulation profile  Recent Labs Lab 03/26/15 1319  INR 1.05    No results for input(s): DDIMER in the last 72 hours.  Cardiac Enzymes No results for input(s): CKMB, TROPONINI, MYOGLOBIN in the last 168 hours.  Invalid input(s): CK ------------------------------------------------------------------------------------------------------------------ Invalid input(s): POCBNP     Time Spent  in minutes   35 minutes   Amjad Fikes M.D on 03/28/2015 at 12:32 PM  Between 7am to 7pm - Pager - [phone]  After 7pm go to [email or web address] - password [email or web address]  And look for the night coverage person covering for me after hours  [facility] Office  [phone]   **Disclaimer: This note may have been dictated with voice recognition software. Similar sounding words can inadvertently be transcribed and this note may contain transcription errors which may not have been corrected upon publication of note.**

## 2015-03-29 ENCOUNTER — Telehealth: Payer: Self-pay | Admitting: Hematology and Oncology

## 2015-03-29 LAB — CBC WITH DIFFERENTIAL/PLATELET
Basophils Absolute: 0 10*3/uL (ref 0.0–0.1)
Basophils Relative: 0 % (ref 0–1)
Eosinophils Absolute: 0 10*3/uL (ref 0.0–0.7)
Eosinophils Relative: 0 % (ref 0–5)
HEMATOCRIT: 41.4 % (ref 39.0–52.0)
Hemoglobin: 14 g/dL (ref 13.0–17.0)
LYMPHS ABS: 1.5 10*3/uL (ref 0.7–4.0)
LYMPHS PCT: 9 % — AB (ref 12–46)
MCH: 28.6 pg (ref 26.0–34.0)
MCHC: 33.8 g/dL (ref 30.0–36.0)
MCV: 84.5 fL (ref 78.0–100.0)
Monocytes Absolute: 0.8 10*3/uL (ref 0.1–1.0)
Monocytes Relative: 5 % (ref 3–12)
NEUTROS ABS: 14.3 10*3/uL — AB (ref 1.7–7.7)
Neutrophils Relative %: 86 % — ABNORMAL HIGH (ref 43–77)
Platelets: 95 10*3/uL — ABNORMAL LOW (ref 150–400)
RBC: 4.9 MIL/uL (ref 4.22–5.81)
RDW: 13 % (ref 11.5–15.5)
WBC: 16.7 10*3/uL — AB (ref 4.0–10.5)

## 2015-03-29 LAB — GLUCOSE, CAPILLARY
GLUCOSE-CAPILLARY: 107 mg/dL — AB (ref 70–99)
Glucose-Capillary: 127 mg/dL — ABNORMAL HIGH (ref 70–99)

## 2015-03-29 LAB — PATHOLOGIST SMEAR REVIEW

## 2015-03-29 MED ORDER — IMMUNE GLOBULIN (HUMAN) 10 GM/100ML IV SOLN
400.0000 mg/kg | INTRAVENOUS | Status: DC
  Administered 2015-03-29: 25 g via INTRAVENOUS
  Filled 2015-03-29: qty 250

## 2015-03-29 NOTE — Discharge Summary (Signed)
John Brennan, 29 y.o., DOB October 23, 1986, MRN [id number]. Admission date: 03/26/2015 Discharge Date 03/29/2015 Primary MD No PCP Per Patient Admitting Physician [name], MD  PCP please follow-up on: - Patient has follow-up appointment with hematology service on 5/2 repeat labs, and further evaluation for ITP .  Admission Diagnosis  Thrombocytopenia [D69.6]  Discharge Diagnosis   Active Problems:   Thrombocytopenia   Transaminasemia   History reviewed. No pertinent past medical history.  History reviewed. No pertinent past surgical history.   Hospital Course See H&P, Labs, Consult and Test reports for all details in brief, patient was admitted for **  Active Problems:   Thrombocytopenia   Transaminasemia  Admission history of present illness/brief narrative: 29 year old male with no chronic medical issues presente with 2 week history of bleeding gums. The patient went to urgent care where a CBC was done. The patient was told that he had a platelet count less than 10,000. The patient was called with results and instructed to come to the emergency department. In addition, the patient has noted some bruising on his arms and legs without any recent injury or trauma. He denies any epistaxis, hemoptysis, hematemesis, hematochezia, melena, hematuria. He denies any fevers, chills, chest pain, shortness breath, nausea, vomiting, diarrhea, dysuria. There is no dizziness or syncope. No headaches or visual changes disturbance.  In the emergency department, CBC revealed the patient's platelets were less than 5000. The patient was afebrile and hemodynamically stable. BMP was unremarkable. AST 49, ALT of 92, alk phosphatase 60, total bilirubin 1.1. INR was 1.05, PTT 35. 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 Alvy Bimler on 5/2 . - Platelet count significantly improved,  95,000 at day of discharge  - HIV nonreactive  Transaminitis - Hepatitis panel negative, LFTs normalized on discharge . - MRI liver with no acute finding  Leukocytosis - secondary to steroids, white blood cell 16.7 on discharge   Consults  Hematology Dr. Alvy Bimler  Significant Tests:  See full reports for all details    Mr Abdomen W Wo Contrast  03/28/2015   CLINICAL DATA:  Abdominal pain. Right hepatic lobe hypoechoic mass. Thrombocytopenia.  EXAM: MRI ABDOMEN WITHOUT AND WITH CONTRAST  TECHNIQUE: Multiplanar multisequence MR imaging of the abdomen was performed both before and after the administration of intravenous contrast.  CONTRAST:  38m MULTIHANCE GADOBENATE DIMEGLUMINE 529 MG/ML IV SOLN  COMPARISON:  Abdominal ultrasound of 03/26/2015.  FINDINGS: Lower chest: Normal heart size without pericardial or pleural effusion.  Hepatobiliary: Heterogeneous hepatic steatosis. Relative sparing in the posterior aspect of the right lobe, including on image 61 of series 7 likely accounted for the ultrasound abnormality. No suspicious liver lesion. Normal gallbladder, without biliary ductal dilatation.  Pancreas: Normal, without mass or ductal dilatation.  Spleen: Normal  Adrenals/Urinary Tract: Normal adrenal glands. Normal kidneys, without hydronephrosis.  Stomach/Bowel: Normal stomach and abdominal bowel loops.  Vascular/Lymphatic: Normal caliber of the aorta and branch vessels. No retroperitoneal or retrocrural adenopathy.  Other: No ascites.  Musculoskeletal: No acute osseous abnormality.  IMPRESSION: Heterogeneous hepatic steatosis. The ultrasound abnormality was likely secondary to sparing.  No  acute abdominal process.    Electronically Signed   By: Abigail Miyamoto M.D.   On: 03/28/2015 16:39   US Abdomen Complete  03/26/2015   CLINICAL DATA:  Generalized abdominal pain.  EXAM: ULTRASOUND ABDOMEN COMPLETE  COMPARISON:  None.  FINDINGS: Gallbladder: No gallstones or wall thickening visualized. No sonographic Murphy sign noted.  Common bile duct: Diameter: 3 mm, within normal limits.  Liver: Diffusely increased echogenicity of the hepatic parenchyma, consistent with diffuse hepatic steatosis/hepatocellular disease. A poorly defined hypoechoic lesion is seen in posterior right hepatic lobe which measures 3.5 x 2.4 x 3.0 cm. Differential diagnosis includes hepatic neoplasm and focal fatty sparing.  IVC: No abnormality visualized.  Pancreas: Visualized portion unremarkable.  Spleen: Size and appearance within normal limits.  Right Kidney: Length: 9.7 cm. Echogenicity within normal limits. No mass or hydronephrosis visualized.  Left Kidney: Length: 10.6 cm. Echogenicity within normal limits. No mass or hydronephrosis visualized.  Abdominal aorta: No aneurysm visualized.  Other findings: None.  IMPRESSION: No evidence of gallstones or biliary dilatation.  3.5 cm ill-defined hypoechoic lesion in the posterior right hepatic lobe. Differential diagnosis includes benign focal fatty sparing as well as hepatic neoplasm. Recommend nonemergent abdomen MRI without and with contrast for further characterization.  Diffuse hepatic steatosis/ hepatocellular disease. Consider nonemergent hepatic elastography ultrasound exam for non-invasive risk assessment for hepatic fibrosis/cirrhosis.   Electronically Signed   By: Earle Gell M.D.   On: 03/26/2015 20:45     Today   Subjective:   [name] today has no headache,no chest abdominal pain,no new weakness tingling or numbness, feels much better wants to go home today.   Objective:   Blood pressure 132/75, pulse 72, temperature 97.6 F (36.4 C), temperature source Oral, resp. rate 16,  height 5' (1.524 m), weight 63.005 kg (138 lb 14.4 oz), SpO2 100 %.  Intake/Output Summary (Last 24 hours) at 03/29/15 1408 Last data filed at 03/29/15 1224  Gross per 24 hour  Intake    490 ml  Output   1825 ml  Net  -1335 ml    Exam Awake Alert, Oriented *3, No new F.N deficits, Normal affect [place].AT,PERRAL Supple Neck,No JVD, No cervical lymphadenopathy appriciated.  Symmetrical Chest wall movement, Good air movement bilaterally, CTAB RRR,No Gallops,Rubs or new Murmurs, No Parasternal Heave +ve B.Sounds, Abd Soft, Non tender, No organomegaly appriciated, No rebound -guarding or rigidity. No Cyanosis, Clubbing or edema, No new Rash or bruise  Data Review   Cultures -   CBC w Diff: Lab Results  Component Value Date   WBC 16.7* 03/29/2015   HGB 14.0 03/29/2015   HCT 41.4 03/29/2015   PLT 95* 03/29/2015   LYMPHOPCT 9* 03/29/2015   MONOPCT 5 03/29/2015   EOSPCT 0 03/29/2015   BASOPCT 0 03/29/2015   CMP: Lab Results  Component Value Date   NA 136 03/28/2015   K 4.1 03/28/2015   CL 104 03/28/2015   CO2 24 03/28/2015   BUN 21 03/28/2015   CREATININE 0.81 03/28/2015   PROT 8.6* 03/28/2015   ALBUMIN 4.3 03/28/2015   BILITOT 0.4 03/28/2015   ALKPHOS 52 03/28/2015   AST 25 03/28/2015   ALT 59* 03/28/2015  .  Micro Results No results found for this or any previous visit (from the past 240 hour(s)).   Discharge Instructions      Follow-up Information    Follow up with [facility], NI, MD.   Specialty:  Hematology and Oncology   Why:  Please follow on the  scheduled appointment on 04/26/15   Contact information:   [place] [address] [phone]       Discharge Medications     Medication List    STOP taking these medications        cyclobenzaprine 5 MG tablet  Commonly known as:  FLEXERIL     methocarbamol 500 MG tablet  Commonly known as:  ROBAXIN     naproxen 500 MG tablet  Commonly known as:  NAPROSYN         Total Time in  preparing paper work, data evaluation and todays exam - 35 minutes  Liberta Gimpel M.D on 03/29/2015 at 2:08 PM  [place]  [phone]

## 2015-03-29 NOTE — Progress Notes (Signed)
Patient discharged home, all discharge medications and instructions reviewed and questions answered.  Patient refused wheelchair assistance to vehicle states will walk.

## 2015-03-29 NOTE — Telephone Encounter (Signed)
Contacted Raynelle FanningJulie the spanish interpreter. Raynelle FanningJulie will notify pt on hosp. F/u appt. On 04/26/15@1 :30

## 2015-03-29 NOTE — Progress Notes (Signed)
John Brennan   DOB:1986/06/02   ZO#:[id number]    Subjective:  History and examination is done via the help of an interpreter. He feels well. He denies further bleeding. No side effects of treatment. Objective:  Filed Vitals:   03/29/15 0541  BP: 115/58  Pulse: 53  Temp: 97.8 F (36.6 C)  Resp: 16     Intake/Output Summary (Last 24 hours) at 03/29/15 0829 Last data filed at 03/29/15 0543  Gross per 24 hour  Intake    720 ml  Output   2450 ml  Net  -1730 ml    GENERAL:alert, no distress and comfortable SKIN: skin color, texture, turgor are normal, no rashes or significant lesions EYES: normal, Conjunctiva are pink and non-injected, sclera clear Musculoskeletal:no cyanosis of digits and no clubbing  NEURO: alert & oriented x 3 with fluent speech, no focal motor/sensory deficits   Labs:  Lab Results  Component Value Date   WBC 16.7* 03/29/2015   HGB 14.0 03/29/2015   HCT 41.4 03/29/2015   MCV 84.5 03/29/2015   PLT 95* 03/29/2015   NEUTROABS 14.3* 03/29/2015    Lab Results  Component Value Date   NA 136 03/28/2015   K 4.1 03/28/2015   CL 104 03/28/2015   CO2 24 03/28/2015    Studies:  Mr Abdomen W Wo Contrast  03/28/2015   CLINICAL DATA:  Abdominal pain. Right hepatic lobe hypoechoic mass. Thrombocytopenia.  EXAM: MRI ABDOMEN WITHOUT AND WITH CONTRAST  TECHNIQUE: Multiplanar multisequence MR imaging of the abdomen was performed both before and after the administration of intravenous contrast.  CONTRAST:  12mL MULTIHANCE GADOBENATE DIMEGLUMINE 529 MG/ML IV SOLN  COMPARISON:  Abdominal ultrasound of 03/26/2015.  FINDINGS: Lower chest: Normal heart size without pericardial or pleural effusion.  Hepatobiliary: Heterogeneous hepatic steatosis. Relative sparing in the posterior aspect of the right lobe, including on image 61 of series 7 likely accounted for the ultrasound abnormality. No suspicious liver lesion. Normal gallbladder, without biliary ductal dilatation.  Pancreas:  Normal, without mass or ductal dilatation.  Spleen: Normal  Adrenals/Urinary Tract: Normal adrenal glands. Normal kidneys, without hydronephrosis.  Stomach/Bowel: Normal stomach and abdominal bowel loops.  Vascular/Lymphatic: Normal caliber of the aorta and branch vessels. No retroperitoneal or retrocrural adenopathy.  Other: No ascites.  Musculoskeletal: No acute osseous abnormality.  IMPRESSION: Heterogeneous hepatic steatosis. The ultrasound abnormality was likely secondary to sparing.  No acute abdominal process.   Electronically Signed   By: Jeronimo Greaves M.D.   On: 03/28/2015 16:39    Assessment & Plan:   Severe thrombocytopenia So far, all workup is negative. 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  He has excellent response to treatment so far with near normalization of platelet count. He will complete 4th day of steroid treatment with IV dexamethasone and 4th treatment of IVIG today I think it is safe to discontinue the fifth day of IVIG treatment and discharge him home today. Per recent publication of her [place] Journal of Medicine, I would not give him any further steroid treatment and to see him back in my clinic in 4 weeks. He agreed with the plan of care.  Elevated liver enzymes He denies excessive alcoholism. Viral screen is negative. US abdomen show fatty liver disease. MRI exclude the liver lesions. I doubt this is the cause of his severe thrombocytopenia  Discharge planning He can be discharged home today with follow-up appointment in 4 weeks on 04/26/2015. Will sign off. Please call if questions arise   Reggie Welge, MD  03/29/2015  8:29 AM

## 2015-03-29 NOTE — Discharge Instructions (Signed)
-   Please follow with oncology in 4 weeks from discharge. - Resume activity as tolerated - Continue with regular diet.

## 2015-04-13 ENCOUNTER — Other Ambulatory Visit (HOSPITAL_BASED_OUTPATIENT_CLINIC_OR_DEPARTMENT_OTHER): Payer: Self-pay

## 2015-04-13 ENCOUNTER — Telehealth: Payer: Self-pay | Admitting: Hematology and Oncology

## 2015-04-13 ENCOUNTER — Encounter (HOSPITAL_COMMUNITY): Payer: Self-pay

## 2015-04-13 ENCOUNTER — Telehealth: Payer: Self-pay | Admitting: *Deleted

## 2015-04-13 ENCOUNTER — Ambulatory Visit (HOSPITAL_COMMUNITY)
Admission: RE | Admit: 2015-04-13 | Discharge: 2015-04-13 | Disposition: A | Discharge status: Home / Self Care | Payer: Self-pay | Source: Ambulatory Visit | Attending: Hematology and Oncology | Admitting: Hematology and Oncology

## 2015-04-13 ENCOUNTER — Other Ambulatory Visit: Payer: Self-pay | Admitting: Hematology and Oncology

## 2015-04-13 ENCOUNTER — Encounter: Payer: Self-pay | Admitting: Hematology and Oncology

## 2015-04-13 ENCOUNTER — Inpatient Hospital Stay (HOSPITAL_COMMUNITY)
Admission: AD | Admit: 2015-04-13 | Discharge: 2015-04-15 | DRG: 813 | Disposition: A | Discharge status: Home / Self Care | Payer: Self-pay | Source: Ambulatory Visit | Attending: Hematology and Oncology | Admitting: Hematology and Oncology

## 2015-04-13 VITALS — BP 129/78 | HR 66 | Temp 97.9°F | Resp 18 | Ht 60.0 in | Wt 140.8 lb

## 2015-04-13 DIAGNOSIS — D696 Thrombocytopenia, unspecified: Secondary | ICD-10-CM

## 2015-04-13 DIAGNOSIS — R740 Nonspecific elevation of levels of transaminase and lactic acid dehydrogenase [LDH]: Secondary | ICD-10-CM

## 2015-04-13 DIAGNOSIS — R945 Abnormal results of liver function studies: Secondary | ICD-10-CM

## 2015-04-13 DIAGNOSIS — R7401 Elevation of levels of liver transaminase levels: Secondary | ICD-10-CM

## 2015-04-13 DIAGNOSIS — D693 Immune thrombocytopenic purpura: Principal | ICD-10-CM | POA: Diagnosis present

## 2015-04-13 LAB — CBC & DIFF AND RETIC
BASO%: 0.5 % (ref 0.0–2.0)
Basophils Absolute: 0 10*3/uL (ref 0.0–0.1)
EOS%: 1.7 % (ref 0.0–7.0)
Eosinophils Absolute: 0.2 10*3/uL (ref 0.0–0.5)
HCT: 45.7 % (ref 38.4–49.9)
HGB: 15.5 g/dL (ref 13.0–17.1)
Immature Retic Fract: 4.8 % (ref 3.00–10.60)
LYMPH%: 12.7 % — ABNORMAL LOW (ref 14.0–49.0)
MCH: 28.6 pg (ref 27.2–33.4)
MCHC: 33.9 g/dL (ref 32.0–36.0)
MCV: 84.3 fL (ref 79.3–98.0)
MONO#: 0.6 10*3/uL (ref 0.1–0.9)
MONO%: 6.5 % (ref 0.0–14.0)
NEUT#: 6.9 10*3/uL — ABNORMAL HIGH (ref 1.5–6.5)
NEUT%: 78.6 % — ABNORMAL HIGH (ref 39.0–75.0)
NRBC: 0 % (ref 0–0)
PLATELETS: 2 10*3/uL — AB (ref 140–400)
RBC: 5.42 10*6/uL (ref 4.20–5.82)
RDW: 12.9 % (ref 11.0–14.6)
Retic %: 1.89 % — ABNORMAL HIGH (ref 0.80–1.80)
Retic Ct Abs: 102.44 10*3/uL — ABNORMAL HIGH (ref 34.80–93.90)
WBC: 8.7 10*3/uL (ref 4.0–10.3)
lymph#: 1.1 10*3/uL (ref 0.9–3.3)

## 2015-04-13 LAB — COMPREHENSIVE METABOLIC PANEL (CC13)
ALBUMIN: 4.5 g/dL (ref 3.5–5.0)
ALT: 81 U/L — ABNORMAL HIGH (ref 0–55)
AST: 39 U/L — AB (ref 5–34)
Alkaline Phosphatase: 59 U/L (ref 40–150)
Anion Gap: 12 mEq/L — ABNORMAL HIGH (ref 3–11)
BUN: 11.3 mg/dL (ref 7.0–26.0)
CHLORIDE: 105 meq/L (ref 98–109)
CO2: 24 mEq/L (ref 22–29)
Calcium: 9.7 mg/dL (ref 8.4–10.4)
Creatinine: 0.8 mg/dL (ref 0.7–1.3)
EGFR: >90 mL/min/{1.73_m2} (ref >90)
GLUCOSE: 81 mg/dL (ref 70–140)
Potassium: 4.7 mEq/L (ref 3.5–5.1)
Sodium: 141 mEq/L (ref 136–145)
TOTAL PROTEIN: 8.4 g/dL — AB (ref 6.4–8.3)
Total Bilirubin: 0.66 mg/dL (ref 0.20–1.20)

## 2015-04-13 LAB — TECHNOLOGIST REVIEW

## 2015-04-13 LAB — HOLD TUBE, BLOOD BANK

## 2015-04-13 LAB — LACTATE DEHYDROGENASE (CC13): LDH: 403 U/L — ABNORMAL HIGH (ref 125–245)

## 2015-04-13 LAB — ABO/RH: ABO/RH(D): O POS

## 2015-04-13 MED ORDER — HEPARIN SOD (PORK) LOCK FLUSH 100 UNIT/ML IV SOLN: 500.0000 [IU] | Freq: Every day | INTRAVENOUS | Status: DC | PRN

## 2015-04-13 MED ORDER — ONDANSETRON 8 MG/NS 50 ML IVPB
8.0000 mg | Freq: Three times a day (TID) | INTRAVENOUS | Status: DC | PRN
  Filled 2015-04-13: qty 8

## 2015-04-13 MED ORDER — METHYLPREDNISOLONE SODIUM SUCC 125 MG IJ SOLR
80.0000 mg | Freq: Every day | INTRAMUSCULAR | Status: DC
  Administered 2015-04-13 – 2015-04-14 (×2): 80 mg via INTRAVENOUS
  Filled 2015-04-13 (×3): qty 1.28

## 2015-04-13 MED ORDER — DIPHENHYDRAMINE HCL 25 MG PO CAPS
25.0000 mg | ORAL_CAPSULE | Freq: Once | ORAL | Status: AC
  Administered 2015-04-13: 25 mg via ORAL
  Filled 2015-04-13: qty 1

## 2015-04-13 MED ORDER — IMMUNE GLOBULIN (HUMAN) 10 GM/100ML IV SOLN
1.0000 g/kg | Freq: Every day | INTRAVENOUS | Status: AC
  Administered 2015-04-13 – 2015-04-14 (×2): 65 g via INTRAVENOUS
  Filled 2015-04-13 (×2): qty 650

## 2015-04-13 MED ORDER — ONDANSETRON 4 MG PO TBDP: 4.0000 mg | ORAL_TABLET | Freq: Three times a day (TID) | ORAL | Status: DC | PRN

## 2015-04-13 MED ORDER — SENNOSIDES-DOCUSATE SODIUM 8.6-50 MG PO TABS: 1.0000 | ORAL_TABLET | Freq: Every evening | ORAL | Status: DC | PRN

## 2015-04-13 MED ORDER — ONDANSETRON HCL 4 MG/2ML IJ SOLN: 4.0000 mg | Freq: Three times a day (TID) | INTRAMUSCULAR | Status: DC | PRN

## 2015-04-13 MED ORDER — ACETAMINOPHEN 325 MG PO TABS
650.0000 mg | ORAL_TABLET | Freq: Once | ORAL | Status: AC
  Administered 2015-04-13: 650 mg via ORAL
  Filled 2015-04-13: qty 2

## 2015-04-13 MED ORDER — SODIUM CHLORIDE 0.9 % IJ SOLN: 3.0000 mL | INTRAMUSCULAR | Status: DC | PRN

## 2015-04-13 MED ORDER — SODIUM CHLORIDE 0.9 % IJ SOLN: 10.0000 mL | INTRAMUSCULAR | Status: DC | PRN

## 2015-04-13 MED ORDER — HYDROCODONE-ACETAMINOPHEN 5-325 MG PO TABS: 1.0000 | ORAL_TABLET | ORAL | Status: DC | PRN

## 2015-04-13 MED ORDER — METHYLPREDNISOLONE SODIUM SUCC 40 MG IJ SOLR: 40.0000 mg | Freq: Once | INTRAMUSCULAR | Status: DC

## 2015-04-13 MED ORDER — ACETAMINOPHEN 325 MG PO TABS: 650.0000 mg | ORAL_TABLET | ORAL | Status: DC | PRN

## 2015-04-13 MED ORDER — ONDANSETRON HCL 4 MG PO TABS: 4.0000 mg | ORAL_TABLET | Freq: Three times a day (TID) | ORAL | Status: DC | PRN

## 2015-04-13 MED ORDER — HEPARIN SOD (PORK) LOCK FLUSH 100 UNIT/ML IV SOLN: 250.0000 [IU] | INTRAVENOUS | Status: DC | PRN

## 2015-04-13 MED ORDER — SODIUM CHLORIDE 0.9 % IV SOLN
250.0000 mL | Freq: Once | INTRAVENOUS | Status: AC
  Administered 2015-04-13: 250 mL via INTRAVENOUS

## 2015-04-13 MED ORDER — PANTOPRAZOLE SODIUM 40 MG PO TBEC
40.0000 mg | DELAYED_RELEASE_TABLET | Freq: Every day | ORAL | Status: DC
  Administered 2015-04-13 – 2015-04-15 (×3): 40 mg via ORAL
  Filled 2015-04-13 (×3): qty 1

## 2015-04-13 NOTE — Telephone Encounter (Signed)
added appt to see MD per nurse

## 2015-04-13 NOTE — H&P (Signed)
[facility] Admission NOTE  Patient Care Team: No Pcp Per Patient as PCP - General (General Practice)  CHIEF COMPLAINTS/PURPOSE OF Admission Severe thrombocytopenia with active bleeding  HISTORY OF PRESENTING ILLNESS: History is obtained via an interpreter John Brennan 29 y.o. male is admitted because of recent diagnosis of a severe ITP. He was recently hospitalized for similar reason of this month and had received IVIG and corticosteroid therapy. Please see my initial consult note dated 03/26/2015 for further details. The patient was subsequently discharged home after improvement of platelet count and no further bleeding. He was seen urgently in my office today with active mild bleeding and increasing bruising. He started to notice bruising in his arms and mild bleeding for the last 2 days. He denies hematuria or hematochezia. Denies recent infection.  MEDICAL HISTORY:  History reviewed. No pertinent past medical history.  SURGICAL HISTORY: History reviewed. No pertinent past surgical history.  SOCIAL HISTORY: History   Social History  . Marital Status: Single    Spouse Name: N/A  . Number of Children: N/A  . Years of Education: N/A   Occupational History  . Not on file.   Social History Main Topics  . Smoking status: Never Smoker   . Smokeless tobacco: Never Used  . Alcohol Use: 1.8 oz/week    3 Cans of beer per week     Comment: occassional beer  . Drug Use: No  . Sexual Activity: Yes   Other Topics Concern  . Not on file   Social History Narrative    FAMILY HISTORY: History reviewed. No pertinent family history.  ALLERGIES:  has No Known Allergies.  MEDICATIONS:  Current Facility-Administered Medications  Medication Dose Route Frequency Provider Last Rate Last Dose  . 0.9 %  sodium chloride infusion  250 mL Intravenous Once Artis Delay, MD      . acetaminophen (TYLENOL) tablet 650 mg  650 mg Oral Once Artis Delay, MD      . acetaminophen  (TYLENOL) tablet 650 mg  650 mg Oral Q4H PRN Artis Delay, MD      . diphenhydrAMINE (BENADRYL) capsule 25 mg  25 mg Oral Once Artis Delay, MD      . heparin lock flush 100 unit/mL  500 Units Intracatheter Daily PRN Artis Delay, MD      . heparin lock flush 100 unit/mL  250 Units Intracatheter PRN Artis Delay, MD      . HYDROcodone-acetaminophen (NORCO/VICODIN) 5-325 MG per tablet 1-2 tablet  1-2 tablet Oral Q4H PRN Artis Delay, MD      . methylPREDNISolone sodium succinate (SOLU-MEDROL) 125 mg/2 mL injection 80 mg  80 mg Intravenous Daily Mekhia Brogan, MD      . methylPREDNISolone sodium succinate (SOLU-MEDROL) 40 mg/mL injection 40 mg  40 mg Intravenous Once Artis Delay, MD      . Octagam (Immune Globulin) 10% solution 65 gm  1 g/kg Intravenous Q1200 Ayslin Kundert, MD      . ondansetron (ZOFRAN) tablet 4-8 mg  4-8 mg Oral Q8H PRN Artis Delay, MD       Or  . ondansetron (ZOFRAN-ODT) disintegrating tablet 4-8 mg  4-8 mg Oral Q8H PRN Artis Delay, MD       Or  . ondansetron (ZOFRAN) injection 4 mg  4 mg Intravenous Q8H PRN Jamilynn Whitacre, MD       Or  . ondansetron (ZOFRAN) 8 mg/NS 50 ml IVPB  8 mg Intravenous Q8H PRN Artis Delay, MD      .  pantoprazole (PROTONIX) EC tablet 40 mg  40 mg Oral Daily Artis DelayNi Jonavon Trieu, MD      . senna-docusate (Senokot-S) tablet 1 tablet  1 tablet Oral QHS PRN Artis DelayNi Salihah Peckham, MD      . sodium chloride 0.9 % injection 10 mL  10 mL Intracatheter PRN Artis DelayNi Sonam Wandel, MD      . sodium chloride 0.9 % injection 3 mL  3 mL Intracatheter PRN Artis DelayNi Ersilia Brawley, MD        REVIEW OF SYSTEMS:   Constitutional: Denies fevers, chills or abnormal night sweats Eyes: Denies blurriness of vision, double vision or watery eyes Ears, nose, mouth, throat, and face: Denies mucositis or sore throat Respiratory: Denies cough, dyspnea or wheezes Cardiovascular: Denies palpitation, chest discomfort or lower extremity swelling Gastrointestinal:  Denies nausea, heartburn or change in bowel habits Skin: Denies abnormal skin  rashes Lymphatics: Denies new lymphadenopathy  Neurological:Denies numbness, tingling or new weaknesses Behavioral/Psych: Mood is stable, no new changes  All other systems were reviewed with the patient and are negative.  PHYSICAL EXAMINATION: ECOG PERFORMANCE STATUS: 0 - Asymptomatic  Filed Vitals:   04/13/15 1303  BP: 124/78  Pulse: 58  Temp: 98.2 F (36.8 C)  Resp: 16   Filed Weights   04/13/15 1318  Weight: 140 lb (63.504 kg)    GENERAL:alert, no distress and comfortable SKIN: No significant skin bruising. EYES: normal, conjunctiva are pink and non-injected, sclera clear OROPHARYNX:no exudate, no erythema and lips, buccal mucosa, and tongue normal . No petechiae rash at the roof of his mouth NECK: supple, thyroid normal size, non-tender, without nodularity LYMPH:  no palpable lymphadenopathy in the cervical, axillary or inguinal LUNGS: clear to auscultation and percussion with normal breathing effort HEART: regular rate & rhythm and no murmurs and no lower extremity edema ABDOMEN:abdomen soft, non-tender and normal bowel sounds Musculoskeletal:no cyanosis of digits and no clubbing  PSYCH: alert & oriented x 3 with fluent speech NEURO: no focal motor/sensory deficits  LABORATORY DATA:  I have reviewed the data as listed Lab Results  Component Value Date   WBC 8.7 04/13/2015   HGB 15.5 04/13/2015   HCT 45.7 04/13/2015   MCV 84.3 04/13/2015   PLT 2* 04/13/2015    Recent Labs  03/26/15 1319 03/27/15 0444 03/28/15 0450 04/13/15 1132  NA 138 135 136 141  K 3.7 4.4 4.1 4.7  CL 103 103 104  --   CO2 26 24 24 24   GLUCOSE 124* 143* 124* 81  BUN 12 15 21  11.3  CREATININE 0.92 0.79 0.81 0.8  CALCIUM 9.9 9.2 9.3 9.7  GFRNONAA >90 >90 >90  --   GFRAA >90 >90 >90  --   PROT 8.4* 9.3* 8.6* 8.4*  ALBUMIN 4.9 4.9 4.3 4.5  AST 49* 39* 25 39*  ALT 92* 78* 59* 81*  ALKPHOS 60 52 52 59  BILITOT 1.1 0.7 0.4 0.66    RADIOGRAPHIC STUDIES: I have personally reviewed  the radiological images as listed and agreed with the findings in the report. Mr Abdomen W Wo Contrast  03/28/2015   CLINICAL DATA:  Abdominal pain. Right hepatic lobe hypoechoic mass. Thrombocytopenia.  EXAM: MRI ABDOMEN WITHOUT AND WITH CONTRAST  TECHNIQUE: Multiplanar multisequence MR imaging of the abdomen was performed both before and after the administration of intravenous contrast.  CONTRAST:  12mL MULTIHANCE GADOBENATE DIMEGLUMINE 529 MG/ML IV SOLN  COMPARISON:  Abdominal ultrasound of 03/26/2015.  FINDINGS: Lower chest: Normal heart size without pericardial or pleural effusion.  Hepatobiliary: Heterogeneous hepatic  steatosis. Relative sparing in the posterior aspect of the right lobe, including on image 61 of series 7 likely accounted for the ultrasound abnormality. No suspicious liver lesion. Normal gallbladder, without biliary ductal dilatation.  Pancreas: Normal, without mass or ductal dilatation.  Spleen: Normal  Adrenals/Urinary Tract: Normal adrenal glands. Normal kidneys, without hydronephrosis.  Stomach/Bowel: Normal stomach and abdominal bowel loops.  Vascular/Lymphatic: Normal caliber of the aorta and branch vessels. No retroperitoneal or retrocrural adenopathy.  Other: No ascites.  Musculoskeletal: No acute osseous abnormality.  IMPRESSION: Heterogeneous hepatic steatosis. The ultrasound abnormality was likely secondary to sparing.  No acute abdominal process.   Electronically Signed   By: Jeronimo Greaves M.D.   On: 03/28/2015 16:39   US Abdomen Complete  03/26/2015   CLINICAL DATA:  Generalized abdominal pain.  EXAM: ULTRASOUND ABDOMEN COMPLETE  COMPARISON:  None.  FINDINGS: Gallbladder: No gallstones or wall thickening visualized. No sonographic Murphy sign noted.  Common bile duct: Diameter: 3 mm, within normal limits.  Liver: Diffusely increased echogenicity of the hepatic parenchyma, consistent with diffuse hepatic steatosis/hepatocellular disease. A poorly defined hypoechoic lesion is seen  in posterior right hepatic lobe which measures 3.5 x 2.4 x 3.0 cm. Differential diagnosis includes hepatic neoplasm and focal fatty sparing.  IVC: No abnormality visualized.  Pancreas: Visualized portion unremarkable.  Spleen: Size and appearance within normal limits.  Right Kidney: Length: 9.7 cm. Echogenicity within normal limits. No mass or hydronephrosis visualized.  Left Kidney: Length: 10.6 cm. Echogenicity within normal limits. No mass or hydronephrosis visualized.  Abdominal aorta: No aneurysm visualized.  Other findings: None.  IMPRESSION: No evidence of gallstones or biliary dilatation.  3.5 cm ill-defined hypoechoic lesion in the posterior right hepatic lobe. Differential diagnosis includes benign focal fatty sparing as well as hepatic neoplasm. Recommend nonemergent abdomen MRI without and with contrast for further characterization.  Diffuse hepatic steatosis/ hepatocellular disease. Consider nonemergent hepatic elastography ultrasound exam for non-invasive risk assessment for hepatic fibrosis/cirrhosis.   Electronically Signed   By: Myles Rosenthal M.D.   On: 03/26/2015 20:45    ASSESSMENT & PLAN:  Severe recurrent ITP I tried my best to discuss the pathophysiology of ITP. He needs urgent admission due to active bleeding and severe thrombocytopenia. I recommend platelet transfusion today and start him on high-dose steroid and IVIG treatment. Once we are able to document stable platelet count, hopefully we can be discharged within the next 2 days and he will be going home to decide on corticosteroid therapy. We discussed some of the risks, benefits, and alternatives of platelets transfusions. The patient is symptomatic from low platelet counts with bruising/bleeding/at high risk of life-threatening bleeding and the platelet count is critically low.  Some of the side-effects to be expected including risks of transfusion reactions, chills, infection, syndrome of volume overload and risk of  hospitalization from various reasons and the patient is willing to proceed and went ahead to sign consent today.  Abnormal liver function tests He has extensive evaluation in the past including ultrasound and MRI of his abdomen which excluded cancer or hepatitis infection. Continue close observation  DVT prophylaxis He will be on TED hose only due to high risk of bleeding and will not be placed on Lovenox  CODE STATUS Full code  Discharge planning Hopefully within the next 48-72 hours. Reason for admission is because of quite threatening bleeding risk.  All questions were answered. The patient knows to call the clinic with any problems, questions or concerns.   Nigel Ericsson, MD  04/13/2015 3:32 PM

## 2015-04-13 NOTE — Progress Notes (Signed)
This encounter was created in error - please disregard.

## 2015-04-14 LAB — CBC WITH DIFFERENTIAL/PLATELET
BASOS ABS: 0 10*3/uL (ref 0.0–0.1)
Basophils Relative: 0 % (ref 0–1)
EOS PCT: 0 % (ref 0–5)
Eosinophils Absolute: 0 10*3/uL (ref 0.0–0.7)
HEMATOCRIT: 40.9 % (ref 39.0–52.0)
Hemoglobin: 13.5 g/dL (ref 13.0–17.0)
LYMPHS PCT: 8 % — AB (ref 12–46)
Lymphs Abs: 0.7 10*3/uL (ref 0.7–4.0)
MCH: 28 pg (ref 26.0–34.0)
MCHC: 33 g/dL (ref 30.0–36.0)
MCV: 84.9 fL (ref 78.0–100.0)
MONO ABS: 0.3 10*3/uL (ref 0.1–1.0)
Monocytes Relative: 4 % (ref 3–12)
Neutro Abs: 8.1 10*3/uL — ABNORMAL HIGH (ref 1.7–7.7)
Neutrophils Relative %: 88 % — ABNORMAL HIGH (ref 43–77)
Platelets: 14 10*3/uL — CL (ref 150–400)
RBC: 4.82 MIL/uL (ref 4.22–5.81)
RDW: 12.7 % (ref 11.5–15.5)
WBC: 9.1 10*3/uL (ref 4.0–10.5)

## 2015-04-14 NOTE — Progress Notes (Signed)
Mardene Celestenocente Deliz   DOB:11/24/1986   NW#:[id number]#:[id number]   MVH#:[id number]#:[id number]  Patient Care Team: No Pcp Per Patient as PCP - General (General Practice)  I have seen the patient, examined him and edited the notes as follows  Subjective: Feeling well this morning, no further oral mucosal bleeding after IVIG and corticosteroid therapy. Denies fevers, chills, night sweats, vision changes, or mucositis. Denies any respiratory complaints. Denies any chest pain or palpitations. Denies lower extremity swelling. Denies nausea, heartburn or change in bowel habits. Last bowel movement on 4/20. Appetite is normal. Denies any dysuria. Denies abnormal skin rashes, or neuropathy. His left arm bruise is almost completely healed. Denies any other bleeding issues such as epistaxis, hematemesis, hematuria or hematochezia. Ambulating without difficulty.   Scheduled Meds: . methylPREDNISolone (SOLU-MEDROL) injection  80 mg Intravenous Daily  . methylPREDNISolone (SOLU-MEDROL) injection  40 mg Intravenous Once  . IMMUNE GLOBULIN 10% (HUMAN) IV - For Fluid Restriction Only  1 g/kg Intravenous Q1200  . pantoprazole  40 mg Oral Daily   Continuous Infusions:  PRN Meds:acetaminophen, heparin lock flush, heparin lock flush, HYDROcodone-acetaminophen, ondansetron **OR** ondansetron **OR** ondansetron (ZOFRAN) IV **OR** ondansetron (ZOFRAN) IV, senna-docusate, sodium chloride, sodium chloride   Objective:  Filed Vitals:   04/14/15 0604  BP: 96/71  Pulse: 56  Temp: 98.1 F (36.7 C)  Resp: 16      Intake/Output Summary (Last 24 hours) at 04/14/15 [id number] Last data filed at 04/13/15 2307  Gross per 24 hour  Intake 1866.96 ml  Output      0 ml  Net 1866.96 ml    ECOG PERFORMANCE STATUS: 0  GENERAL:alert, no distress and comfortable SKIN: skin color, texture, turgor are normal, no rashes or significant lesions. Left antecubital bruise almost resolved. EYES: normal, conjunctiva are pink and non-injected, sclera  clear OROPHARYNX:no exudate, no erythema and lips, buccal mucosa, and tongue normal  NECK: supple, thyroid normal size, non-tender, without nodularity LYMPH:  no palpable lymphadenopathy in the cervical, axillary or inguinal LUNGS: clear to auscultation and percussion with normal breathing effort HEART: regular rate & rhythm and no murmurs and no lower extremity edema ABDOMEN: soft, non-tender and normal bowel sounds Musculoskeletal:no cyanosis of digits and no clubbing  PSYCH: alert & oriented x 3 with fluent speech NEURO: no focal motor/sensory deficits    CBG (last 3)  No results for input(s): GLUCAP in the last 72 hours.   Labs:   Recent Labs Lab 04/13/15 1132 04/14/15 0408  WBC 8.7 9.1  HGB 15.5 13.5  HCT 45.7 40.9  PLT 2* 14*  MCV 84.3 84.9  MCH 28.6 28.0  MCHC 33.9 33.0  RDW 12.9 12.7  LYMPHSABS 1.1 0.7  MONOABS 0.6 0.3  EOSABS 0.2 0.0  BASOSABS 0.0 0.0     Chemistries:    Recent Labs Lab 04/13/15 1132  NA 141  K 4.7  CO2 24  GLUCOSE 81  BUN 11.3  CREATININE 0.8  CALCIUM 9.7  AST 39*  ALT 81*  ALKPHOS 59  BILITOT 0.66    GFR Estimated Creatinine Clearance: 109.6 mL/min (by C-G formula based on Cr of 0.8).  Liver Function Tests:  Recent Labs Lab 04/13/15 1132  AST 39*  ALT 81*  ALKPHOS 59  BILITOT 0.66  PROT 8.4*  ALBUMIN 4.5   No results for input(s): LIPASE, AMYLASE in the last 168 hours. No results for input(s): AMMONIA in the last 168 hours.  Microbiology Recent Results (from the past 240 hour(s))  TECHNOLOGIST REVIEW  Status: None   Collection Time: 04/13/15 11:32 AM  Result Value Ref Range Status   Technologist Review   Final    Large & giant platelets, Occ variant lymph and Neutrophil with vacuoles    Assessment/Plan: 29 y.o.  Severe recurrent ITP He was admitted on 4/19 due to active bleeding and severe thrombocytopenia. He received 1 unit of platelets, high-dose steroid and IVIG treatment with good response,  today at 14,000 (from 2,000) Once we are able to document stable platelet count, hopefully we can be discharged within the next 24 hrs and he will be going home to decide on corticosteroid therapy. Continue day 2 IVIG and high dose steroids  Abnormal liver function tests He has extensive evaluation in the past including ultrasound and MRI of his abdomen which excluded cancer or hepatitis infection. Continue close observation  DVT prophylaxis He is on TED hose only due to high risk of bleeding and will not be placed on Lovenox  CODE STATUS Full code  Discharge planning Hopefully within the next24-48 hours. Reason for admission is because of quite threatening bleeding risk.    Marcos Eke, PA-C 04/14/2015  8:32 AM  Nussen Pullin, MD 04/14/2015

## 2015-04-15 ENCOUNTER — Other Ambulatory Visit: Payer: Self-pay | Admitting: Hematology and Oncology

## 2015-04-15 LAB — CBC WITH DIFFERENTIAL/PLATELET
Basophils Absolute: 0 10*3/uL (ref 0.0–0.1)
Basophils Relative: 0 % (ref 0–1)
EOS PCT: 1 % (ref 0–5)
Eosinophils Absolute: 0.1 10*3/uL (ref 0.0–0.7)
HCT: 38.1 % — ABNORMAL LOW (ref 39.0–52.0)
Hemoglobin: 12.5 g/dL — ABNORMAL LOW (ref 13.0–17.0)
LYMPHS ABS: 1.7 10*3/uL (ref 0.7–4.0)
Lymphocytes Relative: 14 % (ref 12–46)
MCH: 27.8 pg (ref 26.0–34.0)
MCHC: 32.8 g/dL (ref 30.0–36.0)
MCV: 84.9 fL (ref 78.0–100.0)
MONO ABS: 0.8 10*3/uL (ref 0.1–1.0)
Monocytes Relative: 7 % (ref 3–12)
Neutro Abs: 9.5 10*3/uL — ABNORMAL HIGH (ref 1.7–7.7)
Neutrophils Relative %: 79 % — ABNORMAL HIGH (ref 43–77)
Platelets: 74 10*3/uL — ABNORMAL LOW (ref 150–400)
RBC: 4.49 MIL/uL (ref 4.22–5.81)
RDW: 12.8 % (ref 11.5–15.5)
WBC: 12.1 10*3/uL — ABNORMAL HIGH (ref 4.0–10.5)

## 2015-04-15 MED ORDER — PREDNISONE 20 MG PO TABS: 60.0000 mg | ORAL_TABLET | Freq: Every day | ORAL | Status: DC

## 2015-04-15 MED ORDER — PREDNISONE 50 MG PO TABS
60.0000 mg | ORAL_TABLET | Freq: Every day | ORAL | Status: DC
  Administered 2015-04-15: 60 mg via ORAL
  Filled 2015-04-15 (×2): qty 1

## 2015-04-15 MED ORDER — PANTOPRAZOLE SODIUM 40 MG PO TBEC: 40.0000 mg | DELAYED_RELEASE_TABLET | Freq: Every day | ORAL | Status: DC

## 2015-04-15 NOTE — Progress Notes (Signed)
Pt given d/c instructions using interpreter phone.  Pt received spanish printed instructions and spanish interpreter instructions with f/u appts, when to call MD, prescriptions.  Pt asks appropriate questions and verbalizes understanding.  Belongings packed, pt stable for d/c.  Pt desires to walk self out, drive self home.

## 2015-04-15 NOTE — Progress Notes (Signed)
John Brennan   DOB:Oct 25, 1986   GN#:[id number]#:[id number]   HQI#:[id number]#:[id number]  Patient Care Team: No Pcp Per Patient as PCP - General (General Practice)  I have seen the patient, examined him and edited the notes as follows  Subjective: Feeling well this morning, no further oral mucosal bleeding after IVIG and corticosteroid therapy. Denies fevers, chills, night sweats, vision changes, or mucositis. Denies any respiratory complaints. Denies any chest pain or palpitations. Denies lower extremity swelling. Denies nausea, heartburn or change in bowel habits. Last bowel movement on 4/20. Appetite is normal. Denies any dysuria. Denies abnormal skin rashes, or neuropathy. His left arm bruise is almost completely healed. Denies any other bleeding issues such as epistaxis, hematemesis, hematuria or hematochezia. Ambulating without difficulty. History is obtained via an interpreter.  Scheduled Meds: . methylPREDNISolone (SOLU-MEDROL) injection  80 mg Intravenous Daily  . methylPREDNISolone (SOLU-MEDROL) injection  40 mg Intravenous Once  . pantoprazole  40 mg Oral Daily   Continuous Infusions:  PRN Meds:acetaminophen, heparin lock flush, heparin lock flush, HYDROcodone-acetaminophen, ondansetron **OR** ondansetron **OR** ondansetron (ZOFRAN) IV **OR** ondansetron (ZOFRAN) IV, senna-docusate, sodium chloride, sodium chloride   Objective:  Filed Vitals:   04/15/15 0602  BP: 100/62  Pulse: 61  Temp: 98 F (36.7 C)  Resp: 18      Intake/Output Summary (Last 24 hours) at 04/15/15 0740 Last data filed at 04/14/15 2100  Gross per 24 hour  Intake    240 ml  Output      0 ml  Net    240 ml    ECOG PERFORMANCE STATUS: 0  GENERAL:alert, no distress and comfortable SKIN: skin color, texture, turgor are normal, no rashes or significant lesions. Left antecubital bruise resolved. EYES: normal, conjunctiva are pink and non-injected, sclera clear OROPHARYNX:no exudate, no erythema and lips, buccal mucosa, and  tongue normal  NECK: supple, thyroid normal size, non-tender, without nodularity LYMPH:  no palpable lymphadenopathy in the cervical, axillary or inguinal LUNGS: clear to auscultation and percussion with normal breathing effort HEART: regular rate & rhythm and no murmurs and no lower extremity edema ABDOMEN: soft, non-tender and normal bowel sounds Musculoskeletal:no cyanosis of digits and no clubbing  PSYCH: alert & oriented x 3 with fluent speech NEURO: no focal motor/sensory deficits    CBG (last 3)  No results for input(s): GLUCAP in the last 72 hours.   Labs:   Recent Labs Lab 04/13/15 1132 04/14/15 0408 04/15/15 0455  WBC 8.7 9.1 12.1*  HGB 15.5 13.5 12.5*  HCT 45.7 40.9 38.1*  PLT 2* 14* 74*  MCV 84.3 84.9 84.9  MCH 28.6 28.0 27.8  MCHC 33.9 33.0 32.8  RDW 12.9 12.7 12.8  LYMPHSABS 1.1 0.7 1.7  MONOABS 0.6 0.3 0.8  EOSABS 0.2 0.0 0.1  BASOSABS 0.0 0.0 0.0     Chemistries:    Recent Labs Lab 04/13/15 1132  NA 141  K 4.7  CO2 24  GLUCOSE 81  BUN 11.3  CREATININE 0.8  CALCIUM 9.7  AST 39*  ALT 81*  ALKPHOS 59  BILITOT 0.66    GFR Estimated Creatinine Clearance: 109.6 mL/min (by C-G formula based on Cr of 0.8).  Liver Function Tests:  Recent Labs Lab 04/13/15 1132  AST 39*  ALT 81*  ALKPHOS 59  BILITOT 0.66  PROT 8.4*  ALBUMIN 4.5    Microbiology Recent Results (from the past 240 hour(s))  TECHNOLOGIST REVIEW     Status: None   Collection Time: 04/13/15 11:32 AM  Result Value Ref Range  Status   Technologist Review   Final    Large & giant platelets, Occ variant lymph and Neutrophil with vacuoles    Assessment/Plan: 29 y.o.  Severe recurrent ITP He was admitted on 4/19 due to active bleeding and severe thrombocytopenia. He received 1 unit of platelets, high-dose steroid and 2 days of IVIG treatment with good response, today at 74,000 (from 14,000 on 4/20) As platelet count is more stable, hopefully we can be discharged today.  I  plan to start him on 60 mg of prednisone daily and recheck his blood count in my office in about 2 weeks. To reduce risk of gastritis, I will also be prescribed pending pantoprazole in the morning.  Abnormal liver function tests He has extensive evaluation in the past including ultrasound and MRI of his abdomen which excluded cancer or hepatitis infection. Continue close observation I will refer him to gastroenterologist for workup as an outpatient  DVT prophylaxis He is on TED hose only due to high risk of bleeding and has not been on Lovenox  CODE STATUS Full code  Discharge planning Today  Marcos Eke, PA-C 04/15/2015  7:40 AM   Latika Kronick, MD 04/15/2015

## 2015-04-15 NOTE — Discharge Summary (Signed)
Patient ID: John Brennan MRN: [id number] DOB/AGE: 1986/02/28 29 y.o.  Admit date: 04/13/2015 Discharge date: 04/15/2015  Patient Care Team: No Pcp Per Patient as PCP - General (General Practice)  I have seen the patient, examined him and edited the notes as follows  Brief History of Present Illness: For complete details please refer to admission H and P, but in brief, John Brennan 29 y.o. male was admitted because of recent diagnosis of a severe ITP. He was recently hospitalized for similar reason of this month and had received IVIG and corticosteroid therapy (please read initial consult note dated 03/26/2015 for further details).The patient was subsequently discharged home after improvement of platelet count and no further bleeding. He was seen urgently at the [facility] with active mild bleeding and increasing bruising.   MEDICAL HISTORY:   Discharge Diagnoses/Hospital Course:  Severe recurrent ITP He was admitted on 4/19 due to active bleeding and severe thrombocytopenia. He received 1 unit of platelets, high-dose steroid and 2 days of IVIG treatment with good response, today at 74,000 (from 14,000 on 4/20) Plan to DC with prednisone  Abnormal liver function tests He has extensive evaluation in the past including ultrasound and MRI of his abdomen which excluded cancer or hepatitis infection. Continue close observation, consult GI as out-patient  DVT prophylaxis He was placed on TED hose only due to high risk of bleeding and has not been on Lovenox  CODE STATUS Full code   Discharge Medications:    Medication List    TAKE these medications        pantoprazole 40 MG tablet  Commonly known as:  PROTONIX  Take 1 tablet (40 mg total) by mouth daily.     predniSONE 20 MG tablet  Commonly known as:  DELTASONE  Take 3 tablets (60 mg total) by mouth daily with breakfast.        Discharge Condition: Improved.  Diet recommendation:   Regular.   Disposition and Follow-up: Dr. Bertis Ruddy on 5/2 at 2 pm (labs at 1;45)    ECOG PERFORMANCE STATUS: 0  Physical Exam at Discharge:  Subjective: Feeling well this morning, no further oral mucosal bleeding after IVIG and corticosteroid therapy. Denies fevers, chills, night sweats, vision changes, or mucositis. Denies any respiratory complaints. Denies any chest pain or palpitations. Denies lower extremity swelling. Denies nausea, heartburn or change in bowel habits. Last bowel movement on 4/20. Appetite is normal. Denies any dysuria. Denies abnormal skin rashes, or neuropathy. His left arm bruise is almost completely healed. Denies any other bleeding issues such as epistaxis, hematemesis, hematuria or hematochezia. Ambulating without difficulty  Objective:  BP 100/62 mmHg  Pulse 61  Temp(Src) 98 F (36.7 C) (Oral)  Resp 18  Ht 5' (1.524 m)  Wt 140 lb (63.504 kg)  BMI 27.34 kg/m2  SpO2 100%  GENERAL:alert, no distress and comfortable SKIN: skin color, texture, turgor are normal, no rashes or significant lesions. Left antecubital bruise resolved. EYES: normal, conjunctiva are pink and non-injected, sclera clear OROPHARYNX:no exudate, no erythema and lips, buccal mucosa, and tongue normal  NECK: supple, thyroid normal size, non-tender, without nodularity LYMPH: no palpable lymphadenopathy in the cervical, axillary or inguinal LUNGS: clear to auscultation and percussion with normal breathing effort HEART: regular rate & rhythm and no murmurs and no lower extremity edema ABDOMEN: soft, non-tender and normal bowel sounds Musculoskeletal:no cyanosis of digits and no clubbing  PSYCH: alert & oriented x 3 with fluent speech NEURO: no focal motor/sensory deficits   Significant  Diagnostic Studies:  No imaging studies during this hospitalization  Discharge Laboratory Values:  CBC  Recent Labs Lab 04/13/15 1132 04/14/15 0408 04/15/15 0455  WBC 8.7 9.1 12.1*  HGB 15.5 13.5  12.5*  HCT 45.7 40.9 38.1*  PLT 2* 14* 74*  MCV 84.3 84.9 84.9  MCH 28.6 28.0 27.8  MCHC 33.9 33.0 32.8  RDW 12.9 12.7 12.8  LYMPHSABS 1.1 0.7 1.7  MONOABS 0.6 0.3 0.8  EOSABS 0.2 0.0 0.1  BASOSABS 0.0 0.0 0.0    Anemia panel:   Recent Labs  04/13/15 1132  RETICCTPCT 1.89*     Chemistries   Recent Labs Lab 04/13/15 1132  NA 141  K 4.7  CO2 24  GLUCOSE 81  BUN 11.3  CREATININE 0.8  CALCIUM 9.7    Signed: Marlowe KaysSara Wertman, PA-C 04/15/2015, 8:34 AM   Marny Smethers, MD 04/15/2015

## 2015-04-20 NOTE — Telephone Encounter (Signed)
Error

## 2015-04-23 ENCOUNTER — Ambulatory Visit (HOSPITAL_BASED_OUTPATIENT_CLINIC_OR_DEPARTMENT_OTHER): Payer: Self-pay

## 2015-04-23 ENCOUNTER — Other Ambulatory Visit (HOSPITAL_BASED_OUTPATIENT_CLINIC_OR_DEPARTMENT_OTHER): Payer: Self-pay

## 2015-04-23 ENCOUNTER — Encounter: Payer: Self-pay | Admitting: Hematology and Oncology

## 2015-04-23 ENCOUNTER — Other Ambulatory Visit: Payer: Self-pay | Admitting: Hematology and Oncology

## 2015-04-23 ENCOUNTER — Ambulatory Visit (HOSPITAL_BASED_OUTPATIENT_CLINIC_OR_DEPARTMENT_OTHER): Payer: Self-pay | Admitting: Hematology and Oncology

## 2015-04-23 ENCOUNTER — Other Ambulatory Visit: Payer: Self-pay | Admitting: *Deleted

## 2015-04-23 VITALS — BP 120/73 | HR 67 | Temp 97.5°F | Resp 18

## 2015-04-23 VITALS — BP 118/76 | HR 70 | Temp 98.4°F | Resp 16 | Ht 60.0 in | Wt 140.9 lb

## 2015-04-23 DIAGNOSIS — D693 Immune thrombocytopenic purpura: Secondary | ICD-10-CM

## 2015-04-23 DIAGNOSIS — D696 Thrombocytopenia, unspecified: Secondary | ICD-10-CM

## 2015-04-23 LAB — CBC WITH DIFFERENTIAL/PLATELET
BASO%: 0.4 % (ref 0.0–2.0)
BASOS ABS: 0.1 10*3/uL (ref 0.0–0.1)
EOS%: 2.2 % (ref 0.0–7.0)
Eosinophils Absolute: 0.3 10*3/uL (ref 0.0–0.5)
HCT: 48.1 % (ref 38.4–49.9)
HGB: 16.3 g/dL (ref 13.0–17.1)
LYMPH#: 2.7 10*3/uL (ref 0.9–3.3)
LYMPH%: 19.2 % (ref 14.0–49.0)
MCH: 28.4 pg (ref 27.2–33.4)
MCHC: 33.9 g/dL (ref 32.0–36.0)
MCV: 83.8 fL (ref 79.3–98.0)
MONO#: 1.1 10*3/uL — ABNORMAL HIGH (ref 0.1–0.9)
MONO%: 8 % (ref 0.0–14.0)
NEUT#: 9.7 10*3/uL — ABNORMAL HIGH (ref 1.5–6.5)
NEUT%: 70.2 % (ref 39.0–75.0)
NRBC: 0 % (ref 0–0)
Platelets: <2 10*3/uL — CL (ref 145–400)
RBC: 5.74 10*6/uL (ref 4.20–5.82)
RDW: 12.9 % (ref 11.0–14.6)
WBC: 13.8 10*3/uL — ABNORMAL HIGH (ref 4.0–10.3)

## 2015-04-23 MED ORDER — METHYLPREDNISOLONE SODIUM SUCC 125 MG IJ SOLR
80.0000 mg | Freq: Once | INTRAMUSCULAR | Status: AC
  Administered 2015-04-23: 80 mg via INTRAVENOUS

## 2015-04-23 MED ORDER — SODIUM CHLORIDE 0.9 % IJ SOLN
3.0000 mL | INTRAMUSCULAR | Status: DC | PRN
  Filled 2015-04-23: qty 10

## 2015-04-23 MED ORDER — SODIUM CHLORIDE 0.9 % IV SOLN
INTRAVENOUS | Status: DC
  Administered 2015-04-23: 13:00:00 via INTRAVENOUS

## 2015-04-23 MED ORDER — MYCOPHENOLATE MOFETIL 250 MG PO CAPS: 250.0000 mg | ORAL_CAPSULE | Freq: Two times a day (BID) | ORAL | Status: DC

## 2015-04-23 MED ORDER — METHYLPREDNISOLONE SODIUM SUCC 125 MG IJ SOLR
INTRAMUSCULAR | Status: AC
  Filled 2015-04-23: qty 2

## 2015-04-23 NOTE — Patient Instructions (Signed)
Platelet Transfusion Information °This is information about transfusions of platelets. Platelets are tiny cells made by the bone marrow and found in the blood. When a blood vessel is damaged, platelets rush to the damaged area to help form a clot. This begins the healing process. When platelets get very low, your blood may have trouble clotting. This may be from: °· Illness. °· Blood disorder. °· Chemotherapy to treat cancer. °Often, lower platelet counts do not cause problems.  °Platelets usually last for 7 to 10 days. If they are not used in an injury, they are broken down by the liver or spleen. °Symptoms of low platelet count include: °· Nosebleeds. °· Bleeding gums. °· Heavy periods. °· Bruising and tiny blood spots in the skin. °¨ Pinpoint spots of bleeding (petechiae). °¨ Larger bruises (purpura). °· Bleeding can be more serious if it happens in the brain or bowel. °Platelet transfusions are often used to keep the platelet count at an acceptable level. Serious bleeding due to low platelets is uncommon. °RISKS AND COMPLICATIONS °Severe side effects from platelet transfusions are uncommon. Minor reactions may include: °· Itching. °· Rashes. °· High temperature and shivering. °Medications are available to stop transfusion reactions. Let your health care provider know if you develop any of the above problems.  °If you are having platelet transfusions frequently, they may get less effective. This is called becoming refractory to platelets. It is uncommon. This can happen from non-immune causes and immune causes. Non-immune causes include: °· High temperatures. °· Some medications. °· An enlarged spleen. °Immune causes happen when your body discovers the platelets are not your own and begins making antibodies against them. The antibodies kill the platelets quickly. Even with platelet transfusions, you may still notice problems with bleeding or bruising. Let your health care providers know about this. Other things  can be done to help if this happens.  °BEFORE THE PROCEDURE  °· Your health care provider will check your platelet count regularly. °· If the platelet count is too low, it may be necessary to have a platelet transfusion. °· This is more important before certain procedures with a risk of bleeding, such as a spinal tap. °· Platelet transfusion reduces the risk of bleeding during or after the procedure. °· Except in emergencies, giving a transfusion requires a written consent. °Before blood is taken from a donor, a complete history is taken to make sure the person has no history of previous diseases, nor engages in risky social behavior. Examples of this are intravenous drug use or sexual activity with multiple partners. This could lead to infected blood or blood products being used. This history is taken in spite of the extensive testing to make sure the blood is safe. All blood products transfused are tested to make sure it is a match for the person getting the blood. It is also checked for infections. Blood is the safest it has ever been. The risk of getting an infection is very low. °PROCEDURE °· The platelets are stored in small plastic bags that are kept at a low temperature. °· Each bag is called a unit and sometimes two units are given. They are given through an intravenous line by drip infusion over about one-half hour. °· Usually blood is collected from multiple people to get enough to transfuse. °· Sometimes, the platelets are collected from a single person. This is done using a special machine that separates the platelets from the blood. The machine is called an apheresis machine. Platelets collected in this   way are called apheresed platelets. Apheresed platelets reduce the risk of becoming sensitive to the platelets. This lowers the chances of having a transfusion reaction. °· As it only takes a short time to give the platelets, this treatment can be given in an outpatient department. Platelets can also be  given before or after other treatments. °SEEK IMMEDIATE MEDICAL CARE IF: °You have any of the following symptoms over the next 12 hours or several days: °· Shaking chills. °· Fever with a temperature greater than 102°F (38.9°C) develops. °· Back pain or muscle pain. °· People around you feel you are not acting correctly, or you are confused. °· Blood in the urine or bowel movements, or bleeding from any place in your body. °· Shortness of breath, or difficulty breathing. °· Dizziness. °· Fainting. °· You break out in a rash or develop hives. °· Decrease in the amount of urine you are putting out, or the urine turns a dark color or changes to pink, red, or brown. °· A severe headache or stiff neck. °· Bruising more easily. °Document Released: 10/08/2007 Document Revised: 04/27/2014 Document Reviewed: 10/08/2007 °[employer]® Patient Information ©2015 [employer]. This information is not intended to replace advice given to you by your health care provider. Make sure you discuss any questions you have with your health care provider. ° °

## 2015-04-23 NOTE — Assessment & Plan Note (Signed)
Unfortunately, at this point, he is steroid refractory. He has been treated with high-dose dexamethasone, Solu-Medrol and prednisone over the past months with no success. He also has minimum response to IVIG. I discussed with him the choice of either immunosuppressive therapy with CellCept, splenectomy, or growth factor treatment with either Nplate or Promacta. I discussed the risk, benefit, side effects of each treatment. Ultimately, he agreed to try CellCept. I will proceed to give him platelet transfusion today and reassess him again on Monday. I addressed all his questions and concerns. We discussed some of the risks, benefits, and alternatives of platelets transfusions. The patient is symptomatic from low platelet counts with bruising/bleeding/at high risk of life-threatening bleeding and the platelet count is critically low.  Some of the side-effects to be expected including risks of transfusion reactions, chills, infection, syndrome of volume overload and risk of hospitalization from various reasons and the patient is willing to proceed and went ahead to sign consent today.

## 2015-04-23 NOTE — Progress Notes (Signed)
[facility] OFFICE PROGRESS NOTE  No PCP Per Patient SUMMARY OF HEMATOLOGIC HISTORY: John Brennan 29 y.o. was recently diagnosed with severe ITP. He was recently hospitalized for similar reason of in April 2016, twice, and had received IVIG and corticosteroid therapy. Please see my initial consult note dated 03/26/2015 for further details. INTERVAL HISTORY: History is obtained via an interpreter John Brennan 29 y.o. male returns for urgent visit today because of new onset of oral bleeding this morning. He was seen urgently in my office today with active mild bleeding and increasing bruising. He started to notice bruising in his arms and mild bleeding for the last 2 days. He denies hematuria or hematochezia. Denies recent infection. He stated he has been compliant taking the prescribed prednisone and pantoprazole. I have reviewed the past medical history, past surgical history, social history and family history with the patient and they are unchanged from previous note.  ALLERGIES:  has No Known Allergies.  MEDICATIONS:  Current Outpatient Prescriptions  Medication Sig Dispense Refill  . mycophenolate (CELLCEPT) 250 MG capsule Take 1 capsule (250 mg total) by mouth 2 (two) times daily. 60 capsule 3  . pantoprazole (PROTONIX) 40 MG tablet Take 1 tablet (40 mg total) by mouth daily. 30 tablet 6  . predniSONE (DELTASONE) 20 MG tablet Take 3 tablets (60 mg total) by mouth daily with breakfast. 90 tablet 3   Current Facility-Administered Medications  Medication Dose Route Frequency Provider Last Rate Last Dose  . 0.9 %  sodium chloride infusion   Intravenous Continuous Artis DelayNi Momodou Consiglio, MD   Stopped at 04/23/15 1417  . sodium chloride 0.9 % injection 3 mL  3 mL Intracatheter PRN Artis DelayNi Manjit Bufano, MD         REVIEW OF SYSTEMS:   Constitutional: Denies fevers, chills or night sweats Eyes: Denies blurriness of vision Ears, nose, mouth, throat, and face: Denies mucositis or sore  throat Respiratory: Denies cough, dyspnea or wheezes Cardiovascular: Denies palpitation, chest discomfort or lower extremity swelling Gastrointestinal:  Denies nausea, heartburn or change in bowel habits Skin: Denies abnormal skin rashes Lymphatics: Denies new lymphadenopathy or easy bruising Neurological:Denies numbness, tingling or new weaknesses Behavioral/Psych: Mood is stable, no new changes  All other systems were reviewed with the patient and are negative.  PHYSICAL EXAMINATION: ECOG PERFORMANCE STATUS: 0 - Asymptomatic  Filed Vitals:   04/23/15 1244  BP: 118/76  Pulse: 70  Temp: 98.4 F (36.9 C)  Resp: 16   Filed Weights   04/23/15 1058  Weight: 140 lb 14.4 oz (63.912 kg)    GENERAL:alert, no distress and comfortable SKIN: skin color, texture, turgor are normal, no rashes or significant lesions EYES: normal, Conjunctiva are pink and non-injected, sclera clear Musculoskeletal:no cyanosis of digits and no clubbing  NEURO: alert & oriented x 3 with fluent speech, no focal motor/sensory deficits  LABORATORY DATA:  I have reviewed the data as listed Results for orders placed or performed in visit on 04/23/15 (from the past 48 hour(s))  CBC with Differential     Status: Abnormal   Collection Time: 04/23/15 11:20 AM  Result Value Ref Range   WBC 13.8 (H) 4.0 - 10.3 10e3/uL   NEUT# 9.7 (H) 1.5 - 6.5 10e3/uL   HGB 16.3 13.0 - 17.1 g/dL   HCT 16.148.1 09.638.4 - 04.549.9 %   Platelets <2 (LL) 145 - 400 10e3/uL   MCV 83.8 79.3 - 98.0 fL   MCH 28.4 27.2 - 33.4 pg   MCHC 33.9 32.0 -  36.0 g/dL   RBC 1.61 0.96 - 0.45 10e6/uL   RDW 12.9 11.0 - 14.6 %   lymph# 2.7 0.9 - 3.3 10e3/uL   MONO# 1.1 (H) 0.1 - 0.9 10e3/uL   Eosinophils Absolute 0.3 0.0 - 0.5 10e3/uL   Basophils Absolute 0.1 0.0 - 0.1 10e3/uL   NEUT% 70.2 39.0 - 75.0 %   LYMPH% 19.2 14.0 - 49.0 %   MONO% 8.0 0.0 - 14.0 %   EOS% 2.2 0.0 - 7.0 %   BASO% 0.4 0.0 - 2.0 %   nRBC 0 0 - 0 %    Lab Results  Component Value  Date   WBC 13.8* 04/23/2015   HGB 16.3 04/23/2015   HCT 48.1 04/23/2015   MCV 83.8 04/23/2015   PLT <2* 04/23/2015    ASSESSMENT & PLAN:  ITP (idiopathic thrombocytopenic purpura) Unfortunately, at this point, he is steroid refractory. He has been treated with high-dose dexamethasone, Solu-Medrol and prednisone over the past months with no success. He also has minimum response to IVIG. I discussed with him the choice of either immunosuppressive therapy with CellCept, splenectomy, or growth factor treatment with either Nplate or Promacta. I discussed the risk, benefit, side effects of each treatment. Ultimately, he agreed to try CellCept. I will proceed to give him platelet transfusion today and reassess him again on Monday. I addressed all his questions and concerns. We discussed some of the risks, benefits, and alternatives of platelets transfusions. The patient is symptomatic from low platelet counts with bruising/bleeding/at high risk of life-threatening bleeding and the platelet count is critically low.  Some of the side-effects to be expected including risks of transfusion reactions, chills, infection, syndrome of volume overload and risk of hospitalization from various reasons and the patient is willing to proceed and went ahead to sign consent today.     All questions were answered. The patient knows to call the clinic with any problems, questions or concerns. No barriers to learning was detected.  I spent 30 minutes counseling the patient face to face. The total time spent in the appointment was 40 minutes and more than 50% was on counseling.     [facility], [address], MD 4/29/20163:31 PM

## 2015-04-23 NOTE — Patient Instructions (Signed)
Informacin sobre la transfusin de plaquetas (Platelet Transfusion Information) Esta es informacin acerca de la transfusin de plaquetas. Las plaquetas son pequeas clulas producidas en la mdula sea y que se encuentran en la sangre. Cuando un vaso sanguneo se daa, las plaquetas fluyen hacia la zona lesionada para formar un cogulo. Este procedimiento hace comenzar el proceso de curacin. Cuando el nivel de plaquetas es muy bajo en su sangre, podr tener problemas de coagulacin. Esto puede deberse a:  Public relations account executive.  Quimioterapia para tratar Science writer. A menudo, muchas veces los niveles bajos de plaquetas no causan problemas.  Las plaquetas pueden durar entre 7 y 35 das. Si no se utilizan en una lesin el bazo o el hgado las rompen. Los sntomas de un bajo recuento de plaquetas incluyen:  Hemorragias nasales.  Encas que sangran.  Abundante sangrado menstrual.  Hematomas y pequeos puntos rojos en la piel.  Los pequeos puntos de la piel se denominan petequias.  Grandes hematomas(prpura).  La hemorragia puede ser ms seria si sucede en el cerebro o el intestino. Las transfusiones de plaquetas a menudo se utilizan para Printmaker aceptable. Es poco comn que se produzca una hemorragia grave debido a la baja cantidad de plaquetas. RIESGOS Y COMPLICACIONES  No son frecuentes los efectos secundarios graves de [place]. Las reacciones menores pueden incluir:  Picazn  Erupciones  Temperaturas alta y escalofros. Existen medicamentos disponibles para [employer] a las transfusiones. Comente con el profesional que lo asiste si tiene alguno de [employer].  Si recibe transfusiones de plaquetas de manera frecuente pueden ser menos efectivas. Esto se denomina ser refractario a la plaquetas. Esto no es frecuente. Puede suceder por causas inmunes y no inmunes. Las causas no inmunes incluyen:  Fiebre alta.  Algunos  medicamentos.  Bazo agrandado. Las causas inmunes se producen cuando su organismo detecta que las plaquetas no le son propias y fabrican anticuerpos en contra de ellas. [place] plaquetas rpidamente. An con la transfusin de plaquetas usted podra continuar con problemas de sangrado o hematomas. Comunquele a sus mdicos si esto le sucede. Hay otras medidas para tomar si esto le sucede. ANTES DEL [place] controlar su nivel de plaquetas de forma regular.  Si el nivel es muy bajo ser necesario realizarle una transfusin.  Esto es ms importante antes de ciertos procedimiento con riesgo de hemorragia como una puncin espinal.  La transfusin de plaquetas reduce el riesgo de hemorragias durante o despus del procedimiento.  Excepto durante emergencias, la transfusin requiere un consentimiento escrito. Antes de extraer sangre de un donante, se tomar un historial completo para asegurarse que la persona no tiene antecedentes de enfermedades previas y no est presenta conductas sociales de [place]. Son ejemplo de estas ltimas el consumo de drogas por va intravenosa o la actividad sexual con mltiples parejas. Esto podra llevar a que fueran utilizados tanto sangre como sus derivados estando infectados. Esto se realiza incluso adems del exhaustivo control para asegurarse de que la sangre es segura. Todos los productos sanguneos que se transfunden se estudian para asegurarse de que son compatibles con la persona que la recibe. Tambin se controla para descartar la presencia de infecciones. Actualmente la sangre de transfusin es ms segura que nunca antes. El riesgo de infeccin es [place]. [place] plaquetas se almacenan en pequeas bolsas plsticas que se guardan a bajas temperaturas.  Cada bolsa se denomina unidad y a Clinical cytogeneticist se administran dos. Se  administran a travs de una gua intravenosa por goteo durante aproximadamente media hora.  Por lo  general la sangre se obtiene de muchas personas para tener la cantidad suficiente para transfundir.  A veces, las plaquetas se obtienen de Teacher, music. Esto se realiza mediante una mquina especial que separa las plaquetas de la [place]. Esta se denomina mquina de afresis. Las plaquetas obtenidas por esta va se denominan plaquetas por afresis. Las plaquetas por afresis reducen el riesgo de volverse sensible a las plaquetas. Esto disminuye las posibilidades de tener una reaccin a la transfusin.  Como lleva muy poco tiempo donar plaquetas, este tratamiento puede administrarse en un consultorio externo. Las plaquetas tambin pueden administrarse antes o despus de otros tratamientos. SOLICITE ATENCIN MDICA INMEDIATAMENTE SI: Aparecen algunos de los siguientes sntomas en las 12 horas o en los das siguientes:  Siente escalofros.  Sube la fiebre, con una temperatura superior a los 102 F (38.9 C)  Dolores de espalda o [employer] personas que lo rodean creen que usted no Secondary school teacher o lo ven confundido  Sangre al orinar o en la deposicin o hemorragia en cualquier parte del cuerpo.  Le falta la respiracin o presenta dificultades respiratorias.  Mareos.  Desmayos  Desarrolla un sarpullido o urticaria.  Disminuye la cantidad de [place], o sta toma un color oscuro o cambia hacia un tono rosado, rojo o [place].  Dolor de cabeza intenso o rigidez en el cuello.  Se le forman hematomas fcilmente. Document Released: 03/29/2009 Document Revised: 03/04/2012 [facility] Patient Information 2015 [place], [place]. This information is not intended to replace advice given to you by your health care provider. Make sure you discuss any questions you have with your health care provider. Mycophenolate capsules Qu es este medicamento? El MOFETIL MICOFENOLATO se [place] para reducir la respuesta del sistema inmunolgico a un rgano trasplantado. Este medicamento puede ser  utilizado para otros usos; si tiene alguna pregunta consulte con su proveedor de atencin mdica o con su farmacutico. MARCAS COMERCIALES DISPONIBLES: CellCept Qu le debo informar a mi profesional de la salud antes de tomar este medicamento? Necesita saber si usted presenta alguno de los [employer] o situaciones: -anemia u otro trastorno sanguneo -diarrea -problemas del sistema inmunolgico -infeccin -enfermedad renal -fenilcetonuria -problemas de estmago -una reaccin alrgica o inusual al mofetil micofenolato, a otros medicamentos, alimentos, colorantes o conservantes -si est embarazada o buscando quedar embarazada -si est amamantando a un beb Cmo debo utilizar este medicamento? Tome este medicamento por va oral con un vaso lleno de agua. Siga las instrucciones de la etiqueta del [place]. Tome este medicamento con el estmago vaco, al menos 1 hora antes o 2 horas despus de la comida. No tomar con alimentos a menos de su mdico est de acuerdo. Tomar el medicamento entero. No triture, abra ni mastique el medicamento. Si el medicamento est roto o no intacto, evite que el polvo entre en contacto con sus ojos o piel. Si hubiera algn contacto, enjuague el rea cuidadosamente con agua. Tome sus dosis a intervalos regulares. No tome su medicamento con una frecuencia mayor a la indicada. No deje de tomarlo excepto si as lo indica su mdico. Su farmacutico le dar una [place] del medicamento especial con cada receta y relleno. Asegrese de leer esta informacin cada vez cuidadosamente. Hable con su pediatra para informarse acerca del uso de este medicamento en nios. Puede requerir atencin especial. Sobredosis: Pngase en contacto inmediatamente con un centro toxicolgico o una sala de urgencia si usted cree  que haya tomado demasiado medicamento. ATENCIN: Reynolds AmericanEste medicamento es solo para usted. No comparta este medicamento con nadie. Qu sucede si me olvido de una dosis? Si  olvida una dosis, tmela lo antes posible. Si es casi la hora de la prxima dosis, tome slo esa dosis. No tome dosis adicionales o dobles. Qu puede interactuar con este medicamento? -aciclovir o valaciclovir -anticidos -azatioprina -pldoras anticonceptivas -ciertos antibiticos, tales como ciprofloxacina y amoxicillin; cido clavulnico -ganciclovir o valganciclovir -carbonato de lantano -medicamentos para el colesterol tales como colestiramina y colestipol -metronidazol -norfloxacino -otros medicamentos micofenolatos -probenecid -rifampicina -sevelamer -vacunas Puede ser que esta lista no menciona todas las posibles interacciones. Informe a su profesional de Beazer Homesla salud de [employer] Inctodos los productos a base de hierbas, medicamentos de [place]a libre o suplementos nutritivos que est tomando. Si usted fuma, consume bebidas alcohlicas o si utiliza drogas ilegales, indqueselo tambin a su profesional de Beazer Homesla salud. Algunas sustancias pueden interactuar con su medicamento. A qu debo estar atento al usar PPL Corporationeste medicamento? Visite a su mdico o a su profesional de la salud para chequear su evolucin peridicamente. Necesitar realizarse ARAMARK Corporationanlisis de sangre peridicamente durante los primeros meses que est tomando este medicamento. Este medicamento puede aumentar la sensibilidad al sol. Mantngase fuera de Secretary/administratorla luz solar. Si no lo puede evitar, utilice ropa protectora y crema de Orthoptistproteccin solar. No utilice lmparas solares, camas solares ni cabinas solares. Este medicamento puede causar defectos de nacimiento. No se quede embarazada mientras toma este medicamento. Las mujeres deben obtener un resultado negativo en una prueba de embarazo antes de que inicien la terapia. Si es sexualmente activa debe utilizar 2 mtodos anticonceptivos eficaces juntos por 4 semanas antes de Games developercomenzar a Financial risk analysttomar este medicamento, mientras lo toma y 6 semanas despus de haberlo terminado. Las pldoras anticonceptivas pueden no actuar  correctamente mientras est tomando PPL Corporationeste medicamento. Si cree que podr estar embarazada consulte a su mdico de inmediato. Si desarrolla un resfro u otra infeccin mientras est tomando PPL Corporationeste medicamento, consulte a su mdico o su profesional de Beazer Homesla salud. No se trate usted mismo. Este medicamento puede reducir la capacidad del cuerpo para combatir infecciones. Qu efectos secundarios puedo tener al Boston Scientificutilizar este medicamento? Efectos secundarios que debe informar a su mdico o a Producer, television/film/videosu profesional de la salud tan pronto como sea posible: -Therapist, artreacciones alrgicas como erupcin cutnea, picazn o urticarias, hinchazn de la cara, labios o lengua -heces de color oscuro, de aspecto alquitranado o con sangre -cambios en la visin -mareos -fiebre, escalofros o cualquier otro signo de infeccin -sangrado o magulladuras inusuales -cansancio o debilidad inusual Efectos secundarios que, por lo general, no requieren atencin mdica (debe informarlos a su mdico o a su profesional de la salud si persisten o si son molestos): -estreimiento -diarrea -dificultad para conciliar el sueo -prdida del apetito -nuseas, vmito Puede ser que Massachusetts Mutual Lifeesta lista no menciona todos los posibles efectos secundarios. Comunquese a su mdico por asesoramiento mdico Hewlett-Packardsobre los efectos secundarios. Usted puede informar los efectos secundarios a la FDA por telfono al 1-800-FDA-1088. Dnde debo guardar mi medicina? Mantngala fuera del alcance de los nios. Gurdela a Sanmina-SCItemperatura ambiente, entre 15 y 30 grados C (4959 y 5286 grados F). Deseche todo el medicamento que no haya utilizado, despus de la fecha de vencimiento. ATENCIN: Este folleto es un resumen. Puede ser que no cubra toda la posible informacin. Si usted tiene preguntas acerca de esta medicina, consulte con su mdico, su farmacutico o su profesional de Radiographer, therapeuticla salud.  2015, Elsevier/Gold Standard. (2008-07-21 14:25:09)

## 2015-04-26 ENCOUNTER — Inpatient Hospital Stay: Payer: Self-pay | Admitting: Hematology and Oncology

## 2015-04-26 ENCOUNTER — Other Ambulatory Visit: Payer: Self-pay

## 2015-04-26 ENCOUNTER — Inpatient Hospital Stay (HOSPITAL_COMMUNITY)
Admission: EM | Admit: 2015-04-26 | Discharge: 2015-04-28 | DRG: 813 | Disposition: A | Discharge status: Home / Self Care | Payer: Self-pay | Attending: Internal Medicine | Admitting: Internal Medicine

## 2015-04-26 ENCOUNTER — Encounter (HOSPITAL_COMMUNITY): Payer: Self-pay | Admitting: Emergency Medicine

## 2015-04-26 ENCOUNTER — Ambulatory Visit: Payer: Self-pay

## 2015-04-26 ENCOUNTER — Emergency Department (HOSPITAL_COMMUNITY): Payer: Self-pay

## 2015-04-26 DIAGNOSIS — T380X5A Adverse effect of glucocorticoids and synthetic analogues, initial encounter: Secondary | ICD-10-CM | POA: Diagnosis present

## 2015-04-26 DIAGNOSIS — E871 Hypo-osmolality and hyponatremia: Secondary | ICD-10-CM | POA: Diagnosis present

## 2015-04-26 DIAGNOSIS — K921 Melena: Secondary | ICD-10-CM | POA: Diagnosis present

## 2015-04-26 DIAGNOSIS — E861 Hypovolemia: Secondary | ICD-10-CM | POA: Diagnosis present

## 2015-04-26 DIAGNOSIS — R740 Nonspecific elevation of levels of transaminase and lactic acid dehydrogenase [LDH]: Secondary | ICD-10-CM

## 2015-04-26 DIAGNOSIS — K068 Other specified disorders of gingiva and edentulous alveolar ridge: Secondary | ICD-10-CM | POA: Diagnosis present

## 2015-04-26 DIAGNOSIS — E876 Hypokalemia: Secondary | ICD-10-CM

## 2015-04-26 DIAGNOSIS — R7401 Elevation of levels of liver transaminase levels: Secondary | ICD-10-CM

## 2015-04-26 DIAGNOSIS — K2970 Gastritis, unspecified, without bleeding: Secondary | ICD-10-CM | POA: Diagnosis present

## 2015-04-26 DIAGNOSIS — D696 Thrombocytopenia, unspecified: Secondary | ICD-10-CM

## 2015-04-26 DIAGNOSIS — R319 Hematuria, unspecified: Secondary | ICD-10-CM

## 2015-04-26 DIAGNOSIS — D693 Immune thrombocytopenic purpura: Principal | ICD-10-CM

## 2015-04-26 DIAGNOSIS — R51 Headache: Secondary | ICD-10-CM | POA: Diagnosis present

## 2015-04-26 DIAGNOSIS — D72829 Elevated white blood cell count, unspecified: Secondary | ICD-10-CM

## 2015-04-26 HISTORY — DX: Thrombocytopenia, unspecified: D69.6

## 2015-04-26 LAB — CBC WITH DIFFERENTIAL/PLATELET
BASOS PCT: 0 % (ref 0–1)
Basophils Absolute: 0 10*3/uL (ref 0.0–0.1)
EOS ABS: 0.6 10*3/uL (ref 0.0–0.7)
Eosinophils Relative: 4 % (ref 0–5)
HCT: 43.4 % (ref 39.0–52.0)
Hemoglobin: 14.6 g/dL (ref 13.0–17.0)
LYMPHS ABS: 2 10*3/uL (ref 0.7–4.0)
LYMPHS PCT: 13 % (ref 12–46)
MCH: 28.1 pg (ref 26.0–34.0)
MCHC: 33.6 g/dL (ref 30.0–36.0)
MCV: 83.5 fL (ref 78.0–100.0)
MONOS PCT: 8 % (ref 3–12)
Monocytes Absolute: 1.3 10*3/uL — ABNORMAL HIGH (ref 0.1–1.0)
Neutro Abs: 12 10*3/uL — ABNORMAL HIGH (ref 1.7–7.7)
Neutrophils Relative %: 76 % (ref 43–77)
Platelets: <5 10*3/uL — CL (ref 150–400)
RBC: 5.2 MIL/uL (ref 4.22–5.81)
RDW: 12.7 % (ref 11.5–15.5)
WBC: 15.8 10*3/uL — AB (ref 4.0–10.5)

## 2015-04-26 LAB — PREPARE PLATELET PHERESIS
UNIT DIVISION: 0
Unit division: 0
Unit division: 0

## 2015-04-26 LAB — BASIC METABOLIC PANEL
ANION GAP: 6 (ref 5–15)
BUN: 23 mg/dL — ABNORMAL HIGH (ref 6–20)
CALCIUM: 8.6 mg/dL — AB (ref 8.9–10.3)
CHLORIDE: 103 mmol/L (ref 101–111)
CO2: 24 mmol/L (ref 22–32)
Creatinine, Ser: 0.78 mg/dL (ref 0.61–1.24)
GFR calc Af Amer: >60 mL/min (ref >60)
GFR calc non Af Amer: >60 mL/min (ref >60)
Glucose, Bld: 100 mg/dL — ABNORMAL HIGH (ref 70–99)
Potassium: 3.3 mmol/L — ABNORMAL LOW (ref 3.5–5.1)
Sodium: 133 mmol/L — ABNORMAL LOW (ref 135–145)

## 2015-04-26 LAB — PROTIME-INR
INR: 0.92 (ref 0.00–1.49)
Prothrombin Time: 12.5 seconds (ref 11.6–15.2)

## 2015-04-26 LAB — SAMPLE TO BLOOD BANK

## 2015-04-26 MED ORDER — ACETAMINOPHEN 650 MG RE SUPP: 650.0000 mg | Freq: Four times a day (QID) | RECTAL | Status: DC | PRN

## 2015-04-26 MED ORDER — SODIUM CHLORIDE 0.9 % IJ SOLN
3.0000 mL | Freq: Two times a day (BID) | INTRAMUSCULAR | Status: DC
  Administered 2015-04-26 – 2015-04-27 (×4): 3 mL via INTRAVENOUS

## 2015-04-26 MED ORDER — DEXAMETHASONE SODIUM PHOSPHATE 10 MG/ML IJ SOLN
40.0000 mg | Freq: Once | INTRAMUSCULAR | Status: AC
  Administered 2015-04-26: 40 mg via INTRAVENOUS
  Filled 2015-04-26: qty 4

## 2015-04-26 MED ORDER — ONDANSETRON HCL 4 MG/2ML IJ SOLN
4.0000 mg | Freq: Once | INTRAMUSCULAR | Status: AC
  Administered 2015-04-26: 4 mg via INTRAVENOUS
  Filled 2015-04-26: qty 2

## 2015-04-26 MED ORDER — ALUM & MAG HYDROXIDE-SIMETH 200-200-20 MG/5ML PO SUSP: 30.0000 mL | Freq: Four times a day (QID) | ORAL | Status: DC | PRN

## 2015-04-26 MED ORDER — SODIUM CHLORIDE 0.9 % IJ SOLN: 3.0000 mL | INTRAMUSCULAR | Status: DC | PRN

## 2015-04-26 MED ORDER — SODIUM CHLORIDE 0.9 % IV SOLN: 250.0000 mL | INTRAVENOUS | Status: DC | PRN

## 2015-04-26 MED ORDER — SODIUM CHLORIDE 0.9 % IV SOLN: 10.0000 mL/h | Freq: Once | INTRAVENOUS | Status: DC

## 2015-04-26 MED ORDER — ONDANSETRON HCL 4 MG PO TABS: 4.0000 mg | ORAL_TABLET | Freq: Four times a day (QID) | ORAL | Status: DC | PRN

## 2015-04-26 MED ORDER — METHYLPREDNISOLONE SODIUM SUCC 125 MG IJ SOLR
125.0000 mg | Freq: Every day | INTRAMUSCULAR | Status: DC
  Administered 2015-04-26 – 2015-04-27 (×2): 125 mg via INTRAVENOUS
  Filled 2015-04-26 (×3): qty 2

## 2015-04-26 MED ORDER — HYDROMORPHONE HCL 1 MG/ML IJ SOLN
1.0000 mg | Freq: Once | INTRAMUSCULAR | Status: AC
  Administered 2015-04-26: 1 mg via INTRAVENOUS
  Filled 2015-04-26: qty 1

## 2015-04-26 MED ORDER — PANTOPRAZOLE SODIUM 40 MG PO TBEC
40.0000 mg | DELAYED_RELEASE_TABLET | Freq: Every day | ORAL | Status: DC
  Administered 2015-04-26 – 2015-04-28 (×3): 40 mg via ORAL
  Filled 2015-04-26 (×3): qty 1

## 2015-04-26 MED ORDER — HYDROMORPHONE HCL 1 MG/ML IJ SOLN: 1.0000 mg | Freq: Once | INTRAMUSCULAR | Status: DC

## 2015-04-26 MED ORDER — ONDANSETRON HCL 4 MG/2ML IJ SOLN: 4.0000 mg | Freq: Four times a day (QID) | INTRAMUSCULAR | Status: DC | PRN

## 2015-04-26 MED ORDER — MYCOPHENOLATE MOFETIL 250 MG PO CAPS
250.0000 mg | ORAL_CAPSULE | Freq: Two times a day (BID) | ORAL | Status: DC
  Filled 2015-04-26 (×2): qty 1

## 2015-04-26 MED ORDER — OXYCODONE HCL 5 MG PO TABS: 5.0000 mg | ORAL_TABLET | ORAL | Status: DC | PRN

## 2015-04-26 MED ORDER — MYCOPHENOLATE MOFETIL 250 MG PO CAPS
250.0000 mg | ORAL_CAPSULE | Freq: Two times a day (BID) | ORAL | Status: DC
  Administered 2015-04-26 – 2015-04-28 (×5): 250 mg via ORAL
  Filled 2015-04-26 (×6): qty 1

## 2015-04-26 MED ORDER — PREDNISONE 50 MG PO TABS
60.0000 mg | ORAL_TABLET | Freq: Every day | ORAL | Status: DC
  Filled 2015-04-26: qty 1

## 2015-04-26 MED ORDER — ACETAMINOPHEN 325 MG PO TABS: 650.0000 mg | ORAL_TABLET | Freq: Four times a day (QID) | ORAL | Status: DC | PRN

## 2015-04-26 NOTE — ED Notes (Addendum)
Pt from home c/o generalized headache. Hx of thrombocytopenia purpura. Platelet transfusion 4/29. Pt experiencing bleeding gums. Pt is spanish speaking

## 2015-04-26 NOTE — Progress Notes (Signed)
John Brennan   DOB:1986-03-19   RU#:[id number]   BJY#:[id number]  Patient Care Team: No Pcp Per Patient as PCP - General (General Practice)  I have seen the patient, examined him and edited the notes as follows  Subjective: Interpreter is present throughout the entire conversation. John Brennan is a 29 y.o. male with recent diagnosis of ITP ( 04/19-4/21 Admission) who presented to the Emergency Department with complaints of gum bleeding and headache at 11 pm. He was found to have a platelet count of less than 5,000 requiring platelet transfusion and IV prednisone Of note, had been seen at our office on 4/29 with similar complaints, and due to platelet count of less than 2000 he received platelets as well. As his ITP is now refractory, he is planned to start on Cellcept in the near future. His gums continue to bleed at this time, despite platelet transfusion. He denies any hematemesis, but did have an episode of bloody stools.  Denies fevers, chills, night sweats, vision changes, or mucositis. Denies any respiratory complaints. Denies any chest pain or palpitations. Denies lower extremity swelling.  Denies any dysuria, but does have blood in urine. Denies abnormal skin rashes, or neuropathy. Ambulating without difficulty.  SUMMARY OF HEMATOLOGIC HISTORY: John Brennan 29 y.o. was recently diagnosed with severe ITP. He was recently hospitalized for similar reason of in April 2016, twice, and had received IVIG and corticosteroid therapy. Please see my initial consult note dated 03/26/2015 for further details. He was to be started on Cellcept but had not been able to pick up prescription He received platelets transfusion on 4/29 for a platelet count of less than 2000 with minimal response He was admitted by hospital service on 5/1 due to  gum bleeding and headache  CT head on 5/2 is negative for acute findings His platelet count is 5000, requiring platelet transfusion today as well as IV  prednisone, and admission for further management  Scheduled Meds: . sodium chloride  10 mL/hr Intravenous Once  . mycophenolate  250 mg Oral BID  . pantoprazole  40 mg Oral Daily  . [START ON 04/27/2015] predniSONE  60 mg Oral Q breakfast  . sodium chloride  3 mL Intravenous Q12H   Continuous Infusions:  PRN Meds:sodium chloride, acetaminophen **OR** acetaminophen, alum & mag hydroxide-simeth, ondansetron **OR** ondansetron (ZOFRAN) IV, oxyCODONE, sodium chloride   Objective:  Filed Vitals:   04/26/15 0546  BP: 119/66  Pulse: 78  Temp: 98.1 F (36.7 C)  Resp: 16     No intake or output data in the 24 hours ending 04/26/15 0826  ECOG PERFORMANCE STATUS:1  GENERAL:alert, no distress and comfortable SKIN: skin color, texture, turgor are normal, no rashes or significant lesions EYES: normal, conjunctiva are pink and non-injected, sclera clear OROPHARYNX:no exudate, no erythema and lips. Some ecchymmoses noted in gum and tongue  NECK: supple, thyroid normal size, non-tender, without nodularity LYMPH:  no palpable lymphadenopathy in the cervical, axillary or inguinal LUNGS: clear to auscultation and percussion with normal breathing effort HEART: regular rate & rhythm and no murmurs and no lower extremity edema ABDOMEN: soft, non-tender and normal bowel sounds Musculoskeletal:no cyanosis of digits and no clubbing  PSYCH: alert & oriented x 3 with fluent speech NEURO: no focal motor/sensory deficits    CBG (last 3)  No results for input(s): GLUCAP in the last 72 hours.   Labs:   Recent Labs Lab 04/23/15 1120 04/26/15 0205  WBC 13.8* 15.8*  HGB 16.3 14.6  HCT 48.1 43.4  PLT <2* <5*  MCV 83.8 83.5  MCH 28.4 28.1  MCHC 33.9 33.6  RDW 12.9 12.7  LYMPHSABS 2.7 2.0  MONOABS 1.1* 1.3*  EOSABS 0.3 0.6  BASOSABS 0.1 0.0     Chemistries:    Recent Labs Lab 04/26/15 0205  NA 133*  K 3.3*  CL 103  CO2 24  GLUCOSE 100*  BUN 23*  CREATININE 0.78  CALCIUM 8.6*     Coagulation profile  Recent Labs Lab 04/26/15 0205  INR 0.92    Imaging Studies:  Ct Head Wo Contrast  04/26/2015   CLINICAL DATA:  Generalized headache. History of thrombocytopenia, status post platelet transfusion 3 days ago.  EXAM: CT HEAD WITHOUT CONTRAST  TECHNIQUE: Contiguous axial images were obtained from the base of the skull through the vertex without intravenous contrast.  COMPARISON:  None.  FINDINGS: The ventricles and sulci are normal. No intraparenchymal hemorrhage, mass effect nor midline shift. No acute large vascular territory infarcts. Low-lying cerebellar tonsils at the foramen magnum.  No abnormal extra-axial fluid collections. Basal cisterns are patent.  No skull fracture. The included ocular globes and orbital contents are non-suspicious. The mastoid aircells and included paranasal sinuses are well-aerated.  IMPRESSION: No acute intracranial process.  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   Electronically Signed   By: Awilda Metroourtnay  Bloomer   On: 04/26/2015 03:31    Assessment/Plan: 29 y.o.  ITP (idiopathic thrombocytopenic purpura) Unfortunately, at this point, he is steroid refractory. He has been treated with high-dose dexamethasone, Solu-Medrol and prednisone over the past months with no success. He also has minimum response to IVIG. He is to be initiated on immunosuppressive therapy with CellCept He also received platelet transfusion on 4/29 with minimal response  He was admitted today due to gum bleeding. He received 1 unit platelet transfusion for a count of 5000 He will receive IV methylprednisone as well Will continue to monitor   Gum bleeding Hematuria  Hematochezia Due to symotomatic, steroid refractory ITP Continue supportive transfusions To resume Cellcept today  Leukocytosis Likely reactive in the setting of steroids Continue to closely  monitor  DVT prophylaxis On mechanical devices  Full Code  Discharge planning He will be ready for discharge if platelet count is greater than 20,000 with no bleeding. I will return tomorrow for further discussion.  **Disclaimer: This note was dictated with voice recognition software. Similar sounding words can inadvertently be transcribed and this note may contain transcription errors which may not have been corrected upon publication of note.Marlowe Kays** WERTMAN,SARA E, PA-C 04/26/2015  8:26 AM  Othell Jaime, MD 04/26/2015

## 2015-04-26 NOTE — H&P (Addendum)
[facility] Admission History and Physical       John Brennan OZH:[id number] DOB: 08-16-1986 DOA: 04/26/2015  Referring physician: EDP PCP: No PCP Per Patient  Specialists: Dr Louis Matte  Chief Complaint: Bleeding from his Gums and Headache  HPI: John Brennan is a 29 y.o. male with recent diagnosis of ITP ( 04/19 Admission) who presents to the ED with complaints of gum bleeding and headache at 11 pm tonight and was found to have a platelet count of <5 in the ED.  He denies any hemtemesis or melena or hematochezia or hematuria.   He denies any ABD pain.  He was transfused platelets last on 04/29.    A Ct scan of the head was performed and was negative for acute findings.  Hematology/Oncology On Call was consulted and recommended a dose of IV Decadron to be given and a transfusion of plateletes were ordered and Dr Bertis Ruddy will see patient this AM.   Of note:  He was to start Cellcept and Prednisone and Protonix but did not pick up the prescriptions.     Interview of this patient was performed with the use of the Telephone Translation Service.       Review of Systems:  Constitutional: No Weight Loss, No Weight Gain, Night Sweats, Fevers, Chills, Dizziness, Light Headedness, Fatigue, or Generalized Weakness HEENT:  +Headaches, Difficulty Swallowing,+Tooth/Dental Problems: Gum Bleeding,Sore Throat,  No Sneezing, Rhinitis, Ear Ache, Nasal Congestion, or Post Nasal Drip,  Cardio-vascular:  No Chest pain, Orthopnea, PND, Edema in Lower Extremities, Anasarca, Dizziness, Palpitations  Resp: No Dyspnea, No DOE, No Productive Cough, No Non-Productive Cough, No Hemoptysis, No Wheezing.    GI: No Heartburn, Indigestion, Abdominal Pain, Nausea, Vomiting, Diarrhea, Constipation, Hematemesis, Hematochezia, Melena, Change in Bowel Habits,  Loss of Appetite  GU: No Dysuria, No Change in Color of Urine, No Urgency or Urinary Frequency, No Flank pain.  Musculoskeletal: No Joint Pain or Swelling, No  Decreased Range of Motion, No Back Pain.  Neurologic: No Syncope, No Seizures, Muscle Weakness, Paresthesia, Vision Disturbance or Loss, No Diplopia, No Vertigo, No Difficulty Walking,  Skin: No Rash or Lesions. Psych: No Change in Mood or Affect, No Depression or Anxiety, No Memory loss, No Confusion, or Hallucinations   Past Medical History  Diagnosis Date  . Thrombocytopenia      History reviewed. No pertinent past surgical history.    Prior to Admission medications   Medication Sig Start Date End Date Taking? Authorizing Provider  mycophenolate (CELLCEPT) 250 MG capsule Take 1 capsule (250 mg total) by mouth 2 (two) times daily. Patient not taking: Reported on 04/26/2015 04/23/15   Artis Delay, MD  pantoprazole (PROTONIX) 40 MG tablet Take 1 tablet (40 mg total) by mouth daily. Patient not taking: Reported on 04/26/2015 04/15/15   Artis Delay, MD  predniSONE (DELTASONE) 20 MG tablet Take 3 tablets (60 mg total) by mouth daily with breakfast. Patient not taking: Reported on 04/26/2015 04/15/15   Artis Delay, MD     No Known Allergies  Social History:  reports that he has never smoked. He has never used smokeless tobacco. He reports that he drinks about 1.8 oz of alcohol per week. He reports that he does not use illicit drugs.    Family History  Problem Relation Age of Onset  . Family history unknown: Yes       Physical Exam:  GEN:  Pleasant Well Nourished and Well Developed  29 y.o. Hispanic male examined and in no acute distress; cooperative with  exam Filed Vitals:   04/26/15 0145 04/26/15 0409  BP: 132/80 115/64  Pulse: 95 73  Temp: 98.2 F (36.8 C) 98.5 F (36.9 C)  TempSrc: Oral Oral  Resp: 20 16  SpO2: 100% 100%   Blood pressure 115/64, pulse 73, temperature 98.5 F (36.9 C), temperature source Oral, resp. rate 16, SpO2 100 %. PSYCH: He is alert and oriented x4; does not appear anxious does not appear depressed; affect is normal HEENT: Normocephalic and Atraumatic,  Mucous membranes pink; PERRLA; EOM intact; Fundi:  Benign;  No scleral icterus, Nares: Patent, Oropharynx: Clear, Fair Dentition,    Neck:  FROM, No Cervical Lymphadenopathy nor Thyromegaly or Carotid Bruit; No JVD; Breasts:: Not examined CHEST WALL: No tenderness CHEST: Normal respiration, clear to auscultation bilaterally HEART: Regular rate and rhythm; no murmurs rubs or gallops BACK: No kyphosis or scoliosis; No CVA tenderness ABDOMEN: Positive Bowel Sounds, Soft Non-Tender, No Rebound or Guarding; No Masses, No Organomegaly Rectal Exam: Not done EXTREMITIES: No Cyanosis, Clubbing, or Edema; No Ulcerations. Genitalia: not examined PULSES: 2+ and symmetric SKIN: Normal hydration no rash or ulceration CNS:  Alert and Oriented x 4, No Focal Deficits Vascular: pulses palpable throughout    Labs on Admission:  Basic Metabolic Panel:  Recent Labs Lab 04/26/15 0205  NA 133*  K 3.3*  CL 103  CO2 24  GLUCOSE 100*  BUN 23*  CREATININE 0.78  CALCIUM 8.6*   Liver Function Tests: No results for input(s): AST, ALT, ALKPHOS, BILITOT, PROT, ALBUMIN in the last 168 hours. No results for input(s): LIPASE, AMYLASE in the last 168 hours. No results for input(s): AMMONIA in the last 168 hours. CBC:  Recent Labs Lab 04/23/15 1120 04/26/15 0205  WBC 13.8* 15.8*  NEUTROABS 9.7* 12.0*  HGB 16.3 14.6  HCT 48.1 43.4  MCV 83.8 83.5  PLT <2* <5*   Cardiac Enzymes: No results for input(s): CKTOTAL, CKMB, CKMBINDEX, TROPONINI in the last 168 hours.  BNP (last 3 results) No results for input(s): BNP in the last 8760 hours.  ProBNP (last 3 results) No results for input(s): PROBNP in the last 8760 hours.  CBG: No results for input(s): GLUCAP in the last 168 hours.  Radiological Exams on Admission: Ct Head Wo Contrast  04/26/2015   CLINICAL DATA:  Generalized headache. History of thrombocytopenia, status post platelet transfusion 3 days ago.  EXAM: CT HEAD WITHOUT CONTRAST  TECHNIQUE:  Contiguous axial images were obtained from the base of the skull through the vertex without intravenous contrast.  COMPARISON:  None.  FINDINGS: The ventricles and sulci are normal. No intraparenchymal hemorrhage, mass effect nor midline shift. No acute large vascular territory infarcts. Low-lying cerebellar tonsils at the foramen magnum.  No abnormal extra-axial fluid collections. Basal cisterns are patent.  No skull fracture. The included ocular globes and orbital contents are non-suspicious. The mastoid aircells and included paranasal sinuses are well-aerated.  IMPRESSION: No acute intracranial process.  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   Electronically Signed   By: Awilda Metro   On: 04/26/2015 03:31       Assessment/Plan:   29 y.o. male with  Principal Problem:   1.    ITP (idiopathic thrombocytopenic purpura)- did not pick up Meds post Hospitialization   IV Decadron x 1, Restart PO Steroids in AM   Transfuse 1 Pack of Platelets   Restart Cellcept, Prednisone and Protonix  Protonix   Active Problems:   2.   Thrombocytopenia- Due to #1   Hematology/Oncology Dr Bertis RuddyGorsuch to see this AM     3.   Hypokalemia   Replace K+   Check Magnesium     4.   DVT Prophylaxis   Deferred,     No Lovenox due to #1    No SCDs due to #2          Code Status:     FULL CODE       Family Communication:   Family at Bedside    Disposition Plan:    Inpatient  Status        Time spent:  7060 Minutes      Ron ParkerJENKINS,Cairo Lingenfelter C [facility] Pager [phone]   If 7AM -7PM Please Contact the Day Rounding Team MD for [facility]  If 7PM-7AM, Please Contact Night-Floor Coverage  [email or web address] Password [email or web address] 04/26/2015, 4:43 AM     ADDENDUM:   Patient was seen and examined on 04/26/2015

## 2015-04-26 NOTE — ED Notes (Signed)
Report given to 4 th floor 

## 2015-04-26 NOTE — ED Notes (Signed)
Spoke to [place] in the lab about Platelets. She reports that it will be a while until platelets are ready. They much be ordered the radiated.

## 2015-04-26 NOTE — Progress Notes (Signed)
[facility] PROGRESS NOTE   Mardene Celestenocente Attig ZOX:[id number] DOB: 01-25-1986 DOA: 04/26/2015 PCP: No PCP Per Patient  HPI/Subjective: Spoke with him using help of Mrs. Ashok CordiaDeanna RN, she speak Spanish. Mentioned, bleeding and he thought his urine is getting reddish. He vomited the night before last, unsure of their his blood in it or not.  Assessment/Plan: Principal Problem:   ITP (idiopathic thrombocytopenic purpura) Active Problems:   Thrombocytopenia   Hypokalemia   This is a no charge note, patient seen earlier today by my colleague Dr. Lovell SheehanJenkins.  Code Status: Full Code Family Communication: Plan discussed with the patient. Disposition Plan: Remains inpatient Diet: Diet regular Room service appropriate?: Yes; Fluid consistency:: Thin  Consultants:  Dr. Bertis RuddyGorsuch  Procedures:  None  Antibiotics:  None   Objective: Filed Vitals:   04/26/15 1430  BP: 120/68  Pulse: 80  Temp: 98.6 F (37 C)  Resp: 18   No intake or output data in the 24 hours ending 04/26/15 1800 Filed Weights   04/26/15 0546  Weight: 65.318 kg (144 lb)    Exam: General: Alert and awake, oriented x3, not in any acute distress. HEENT: Ecchymosis involving the mucosal membranes of his mouth CVS: S1-S2 clear, no murmur rubs or gallops Chest: clear to auscultation bilaterally, no wheezing, rales or rhonchi Abdomen: soft nontender, nondistended, normal bowel sounds, no organomegaly Extremities: no cyanosis, clubbing or edema noted bilaterally Neuro: Cranial nerves II-XII intact, no focal neurological deficits  Data Reviewed: Basic Metabolic Panel:  Recent Labs Lab 04/26/15 0205  NA 133*  K 3.3*  CL 103  CO2 24  GLUCOSE 100*  BUN 23*  CREATININE 0.78  CALCIUM 8.6*   Liver Function Tests: No results for input(s): AST, ALT, ALKPHOS, BILITOT, PROT, ALBUMIN in the last 168 hours. No results for input(s): LIPASE, AMYLASE in the last 168 hours. No results for input(s): AMMONIA in the  last 168 hours. CBC:  Recent Labs Lab 04/23/15 1120 04/26/15 0205  WBC 13.8* 15.8*  NEUTROABS 9.7* 12.0*  HGB 16.3 14.6  HCT 48.1 43.4  MCV 83.8 83.5  PLT <2* <5*   Cardiac Enzymes: No results for input(s): CKTOTAL, CKMB, CKMBINDEX, TROPONINI in the last 168 hours. BNP (last 3 results) No results for input(s): BNP in the last 8760 hours.  ProBNP (last 3 results) No results for input(s): PROBNP in the last 8760 hours.  CBG: No results for input(s): GLUCAP in the last 168 hours.  Micro No results found for this or any previous visit (from the past 240 hour(s)).   Studies: Ct Head Wo Contrast  04/26/2015   CLINICAL DATA:  Generalized headache. History of thrombocytopenia, status post platelet transfusion 3 days ago.  EXAM: CT HEAD WITHOUT CONTRAST  TECHNIQUE: Contiguous axial images were obtained from the base of the skull through the vertex without intravenous contrast.  COMPARISON:  None.  FINDINGS: The ventricles and sulci are normal. No intraparenchymal hemorrhage, mass effect nor midline shift. No acute large vascular territory infarcts. Low-lying cerebellar tonsils at the foramen magnum.  No abnormal extra-axial fluid collections. Basal cisterns are patent.  No skull fracture. The included ocular globes and orbital contents are non-suspicious. The mastoid aircells and included paranasal sinuses are well-aerated.  IMPRESSION: No acute intracranial process.  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   Electronically Signed   By: Awilda Metroourtnay  Bloomer   On: 04/26/2015 03:31    Scheduled  Meds: . sodium chloride  10 mL/hr Intravenous Once  . methylPREDNISolone (SOLU-MEDROL) injection  125 mg Intravenous Daily  . mycophenolate  250 mg Oral BID  . mycophenolate  250 mg Oral BID  . pantoprazole  40 mg Oral Daily  . sodium chloride  3 mL Intravenous Q12H   Continuous Infusions:       Time spent: 35 minutes    [facility]  [facility] Pager [phone] If 7PM-7AM, please contact night-coverage at [email or web address], password [facility] 04/26/2015, 6:00 PM  LOS: 0 days

## 2015-04-26 NOTE — ED Notes (Signed)
Critical platelet count <5 Read back and verified with Susie in lab  Primary RN and MD notified

## 2015-04-26 NOTE — ED Provider Notes (Signed)
CSN: [id number]     Arrival date & time 04/26/15  0141 History   First MD Initiated Contact with Patient 04/26/15 0155     Chief Complaint  Patient presents with  . Headache     (Consider location/radiation/quality/duration/timing/severity/associated sxs/prior Treatment) Patient is a 29 y.o. male presenting with headaches. The history is provided by the patient. The history is limited by a language barrier. A language interpreter was used (language line).  Headache Pain location:  Generalized Quality:  Dull Radiates to:  Does not radiate Severity currently:  10/10 Onset quality:  Sudden Duration: started at 11PM on 04/25/15. Timing:  Constant Progression:  Unchanged Chronicity:  New Similar to prior headaches: no   Relieved by:  Nothing Worsened by:  Activity Ineffective treatments:  None tried Associated symptoms: dizziness and nausea   Associated symptoms: no abdominal pain, no eye pain, no fatigue, no focal weakness, no loss of balance, no neck pain, no numbness, no seizures, no syncope and no vomiting   Risk factors comment:  Hx of Thrombocytopenia Pt is a 29yo Hispanic male recently admitted on 4/19, discharged on 4/21 for evaluation and treatment of severe recurrent ITP.  Pt received 1 unit of platelets, high-dose steroid and 2 days of IVIG treatment with good response,  Platelets improved from 14,000 to 74,000 at discharge.  Pt being followed by oncology, was scheduled for f/u later today.  Pt developed a severe, sudden onset generalized headache with associated dizziness, nausea and a bloody taste in mouth which all started around 11PM last night. States he was sitting at home when symptoms started. No recent falls or head trauma. Pt reports picking up a new medication, Cellcept, on Friday.  Past Medical History  Diagnosis Date  . Thrombocytopenia    History reviewed. No pertinent past surgical history. Family History  Problem Relation Age of Onset  . Family history unknown:  Yes   History  Substance Use Topics  . Smoking status: Never Smoker   . Smokeless tobacco: Never Used  . Alcohol Use: 1.8 oz/week    3 Cans of beer per week     Comment: occassional beer    Review of Systems  Constitutional: Negative for chills and fatigue.  HENT:       Bloody taste in mouth at onset of headache  Eyes: Negative for pain.  Cardiovascular: Negative for syncope.  Gastrointestinal: Positive for nausea. Negative for vomiting and abdominal pain.  Musculoskeletal: Negative for neck pain.  Neurological: Positive for dizziness and headaches. Negative for focal weakness, seizures, syncope, numbness and loss of balance.  All other systems reviewed and are negative.     Allergies  Review of patient's allergies indicates no known allergies.  Home Medications   Prior to Admission medications   Medication Sig Start Date End Date Taking? Authorizing Provider  mycophenolate (CELLCEPT) 250 MG capsule Take 1 capsule (250 mg total) by mouth 2 (two) times daily. Patient not taking: Reported on 04/26/2015 04/23/15   Artis DelayNi Gorsuch, MD  pantoprazole (PROTONIX) 40 MG tablet Take 1 tablet (40 mg total) by mouth daily. Patient not taking: Reported on 04/26/2015 04/15/15   Artis DelayNi Gorsuch, MD  predniSONE (DELTASONE) 20 MG tablet Take 3 tablets (60 mg total) by mouth daily with breakfast. Patient not taking: Reported on 04/26/2015 04/15/15   Artis DelayNi Gorsuch, MD   BP 115/64 mmHg  Pulse 73  Temp(Src) 98.5 F (36.9 C) (Oral)  Resp 16  SpO2 100% Physical Exam  Constitutional: He is oriented to person, place,  and time. He appears well-developed and well-nourished.  Pt lying comfortably on exam bed, NAD  HENT:  Head: Normocephalic and atraumatic.  Right Ear: Hearing, tympanic membrane, external ear and ear canal normal.  Left Ear: Hearing, tympanic membrane, external ear and ear canal normal.  Nose: Nose normal.  Mouth/Throat: Uvula is midline, oropharynx is clear and moist and mucous membranes are  normal. Oral lesions present.  Petechiae on soft palate, 4 small blood blisters on top of tongue and buccal mucosa. No active gingival bleeding. No blood in posterior pharynx.   Eyes: Conjunctivae and EOM are normal. Pupils are equal, round, and reactive to light. No scleral icterus.  Neck: Normal range of motion. Neck supple.  Cardiovascular: Normal rate, regular rhythm and normal heart sounds.   Pulmonary/Chest: Effort normal and breath sounds normal. No respiratory distress. He has no wheezes. He has no rales. He exhibits no tenderness.  Abdominal: Soft. Bowel sounds are normal. He exhibits no distension and no mass. There is no tenderness. There is no rebound and no guarding.  Musculoskeletal: Normal range of motion.  Neurological: He is alert and oriented to person, place, and time. He has normal strength. No cranial nerve deficit or sensory deficit. He displays a negative Romberg sign. GCS eye subscore is 4. GCS verbal subscore is 5. GCS motor subscore is 6.  Alert and oriented. CN II-XII in tact. Able to follow 2 step commands. Pt is Spanish speaking however, speech sounds fluent. FROM upper and lower extremities with 5/5 strength. Normal coordination.   Skin: Skin is warm and dry.  Nursing note and vitals reviewed.   ED Course  Procedures (including critical care time) Labs Review Labs Reviewed  CBC WITH DIFFERENTIAL/PLATELET - Abnormal; Notable for the following:    WBC 15.8 (*)    Platelets <5 (*)    Neutro Abs 12.0 (*)    Monocytes Absolute 1.3 (*)    All other components within normal limits  BASIC METABOLIC PANEL - Abnormal; Notable for the following:    Sodium 133 (*)    Potassium 3.3 (*)    Glucose, Bld 100 (*)    BUN 23 (*)    Calcium 8.6 (*)    All other components within normal limits  PROTIME-INR  SAMPLE TO BLOOD BANK  PREPARE PLATELET PHERESIS    Imaging Review Ct Head Wo Contrast  04/26/2015   CLINICAL DATA:  Generalized headache. History of thrombocytopenia,  status post platelet transfusion 3 days ago.  EXAM: CT HEAD WITHOUT CONTRAST  TECHNIQUE: Contiguous axial images were obtained from the base of the skull through the vertex without intravenous contrast.  COMPARISON:  None.  FINDINGS: The ventricles and sulci are normal. No intraparenchymal hemorrhage, mass effect nor midline shift. No acute large vascular territory infarcts. Low-lying cerebellar tonsils at the foramen magnum.  No abnormal extra-axial fluid collections. Basal cisterns are patent.  No skull fracture. The included ocular globes and orbital contents are non-suspicious. The mastoid aircells and included paranasal sinuses are well-aerated.  IMPRESSION: No acute intracranial process.  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   Electronically Signed   By: Awilda Metro   On: 04/26/2015 03:31     EKG Interpretation None      MDM   Final diagnoses:  ITP (idiopathic thrombocytopenic purpura)    Pt is a 29yo male recently tx for severe recurrent ITP, presenting to ED  with c/o sudden onset, severe diffuse headache with associated dizziness, nausea and "bloody taste in mouth" Blood blisters on tongue and buccal mucosa upon exam.  Pt is afebrile, alert. No focal neuro deficit. Hemodynamically stable.   Labs: PLT: <5, discussed with Dr. Norlene Campbell, will order 1 unit of platelets. CT head: no acute intracranial process.   Will consult with hospitalist to admit for thrombocytopenia.    4:22 AM Consulted with Dr. Lovell Sheehan, agreed to have pt admitted. Temp orders placed for med-surg bed.  Requested consult with hem/onc.  Consult request placed.   4:41 AM Consulted with Dr. Myna Hidalgo, oncology, recommended starting pt on  decadron.  Order placed.  Dr. Bertis Ruddy, oncology, to f/u with pt later today.     Junius Finner, PA-C 04/26/15 0443  Junius Finner, PA-C 04/26/15 1610  Marisa Severin,  MD 04/26/15 [phone]

## 2015-04-27 DIAGNOSIS — K2970 Gastritis, unspecified, without bleeding: Secondary | ICD-10-CM

## 2015-04-27 LAB — CBC
HCT: 41.2 % (ref 39.0–52.0)
Hemoglobin: 13.7 g/dL (ref 13.0–17.0)
MCH: 27.3 pg (ref 26.0–34.0)
MCHC: 33.3 g/dL (ref 30.0–36.0)
MCV: 82.2 fL (ref 78.0–100.0)
Platelets: 8 10*3/uL — CL (ref 150–400)
RBC: 5.01 MIL/uL (ref 4.22–5.81)
RDW: 12.6 % (ref 11.5–15.5)
WBC: 18.6 10*3/uL — AB (ref 4.0–10.5)

## 2015-04-27 LAB — BONE MARROW EXAM

## 2015-04-27 LAB — BASIC METABOLIC PANEL
Anion gap: 4 — ABNORMAL LOW (ref 5–15)
BUN: 17 mg/dL (ref 6–20)
CO2: 25 mmol/L (ref 22–32)
Calcium: 8.8 mg/dL — ABNORMAL LOW (ref 8.9–10.3)
Chloride: 100 mmol/L — ABNORMAL LOW (ref 101–111)
Creatinine, Ser: 0.75 mg/dL (ref 0.61–1.24)
GFR calc Af Amer: >60 mL/min (ref >60)
GFR calc non Af Amer: >60 mL/min (ref >60)
Glucose, Bld: 145 mg/dL — ABNORMAL HIGH (ref 70–99)
Potassium: 3.8 mmol/L (ref 3.5–5.1)
Sodium: 129 mmol/L — ABNORMAL LOW (ref 135–145)

## 2015-04-27 MED ORDER — SODIUM CHLORIDE 0.9 % IV SOLN
Freq: Once | INTRAVENOUS | Status: AC
  Administered 2015-04-27: 11:00:00 via INTRAVENOUS

## 2015-04-27 MED ORDER — ROMIPLOSTIM 250 MCG ~~LOC~~ SOLR
1.0000 ug/kg | Freq: Once | SUBCUTANEOUS | Status: AC
  Administered 2015-04-27: 65 ug via SUBCUTANEOUS
  Filled 2015-04-27: qty 0.13

## 2015-04-27 NOTE — Procedures (Signed)
History and consent were obtained via interpreter Bone Marrow Biopsy and Aspiration Procedure Note   Informed consent was obtained and potential risks including bleeding, infection and pain were reviewed with the patient.   The patient's name, date of birth, identification, consent and allergies were verified prior to the start of procedure and time out was performed.  The left posterior iliac crest was chosen as the site of biopsy.  The skin was prepped with Betadine solution.   8 cc of 1% lidocaine was used to provide local anaesthesia.   10 cc of bone marrow aspirate was obtained followed by 1 inch biopsy.   The procedure was tolerated well and there were no complications.  The patient was stable at the end of the procedure.  Specimens sent for flow cytometry, cytogenetics and additional studies.

## 2015-04-27 NOTE — Progress Notes (Signed)
John Brennan   DOB:1986-09-12   VH#:[id number]#:[id number]   [id number]#:[id number]#:[id number]  Patient Care Team: No Pcp Per Patient as PCP - General (General Practice)  I have seen the patient, examined him and edited the notes as follows Interpreter was present. He continues to have mild spitting up blood. He complained of gastritis. Subjective: Patient seen and examined. No further bleeding issues including gum bleeding, hematuria or hematochezia, all resolved. Denies fevers, chills, night sweats, vision changes, or mucositis. Denies any respiratory complaints. Denies any chest pain or palpitations. Denies lower extremity swelling. Denies any dysuria,  Denies abnormal skin rashes, or neuropathy. Ambulating without difficulty.  Scheduled Meds: . sodium chloride  10 mL/hr Intravenous Once  . methylPREDNISolone (SOLU-MEDROL) injection  125 mg Intravenous Daily  . mycophenolate  250 mg Oral BID  . pantoprazole  40 mg Oral Daily  . sodium chloride  3 mL Intravenous Q12H   Continuous Infusions:  PRN Meds:sodium chloride, acetaminophen **OR** acetaminophen, alum & mag hydroxide-simeth, ondansetron **OR** ondansetron (ZOFRAN) IV, oxyCODONE, sodium chloride   Objective:  Filed Vitals:   04/27/15 0553  BP: 130/66  Pulse: 77  Temp: 97.8 F (36.6 C)  Resp: 18      Intake/Output Summary (Last 24 hours) at 04/27/15 0756 Last data filed at 04/27/15 0554  Gross per 24 hour  Intake    480 ml  Output   3150 ml  Net  -2670 ml    ECOG PERFORMANCE STATUS:1  GENERAL:alert, no distress and comfortable SKIN: skin color, texture, turgor are normal, no rashes or significant lesions EYES: normal, conjunctiva are pink and non-injected, sclera clear OROPHARYNX:no exudate, no erythema and lips. Some ecchymmoses noted in gum and tongue  NECK: supple, thyroid normal size, non-tender, without nodularity LYMPH:  no palpable lymphadenopathy in the cervical, axillary or inguinal LUNGS: clear to auscultation and percussion with  normal breathing effort HEART: regular rate & rhythm and no murmurs and no lower extremity edema ABDOMEN: soft, non-tender and normal bowel sounds Musculoskeletal:no cyanosis of digits and no clubbing  PSYCH: alert & oriented x 3 with fluent speech NEURO: no focal motor/sensory deficits    CBG (last 3)  No results for input(s): GLUCAP in the last 72 hours.   Labs:   Recent Labs Lab 04/23/15 1120 04/26/15 0205 04/27/15 0400  WBC 13.8* 15.8* 18.6*  HGB 16.3 14.6 13.7  HCT 48.1 43.4 41.2  PLT <2* <5* 8*  MCV 83.8 83.5 82.2  MCH 28.4 28.1 27.3  MCHC 33.9 33.6 33.3  RDW 12.9 12.7 12.6  LYMPHSABS 2.7 2.0  --   MONOABS 1.1* 1.3*  --   EOSABS 0.3 0.6  --   BASOSABS 0.1 0.0  --      Chemistries:    Recent Labs Lab 04/26/15 0205 04/27/15 0400  NA 133* 129*  K 3.3* 3.8  CL 103 100*  CO2 24 25  GLUCOSE 100* 145*  BUN 23* 17  CREATININE 0.78 0.75  CALCIUM 8.6* 8.8*    Coagulation profile  Recent Labs Lab 04/26/15 0205  INR 0.92    Imaging Studies:  Ct Head Wo Contrast  04/26/2015   CLINICAL DATA:  Generalized headache. History of thrombocytopenia, status post platelet transfusion 3 days ago.  EXAM: CT HEAD WITHOUT CONTRAST  TECHNIQUE: Contiguous axial images were obtained from the base of the skull through the vertex without intravenous contrast.  COMPARISON:  None.  FINDINGS: The ventricles and sulci are normal. No intraparenchymal hemorrhage, mass effect nor midline shift. No acute large vascular  territory infarcts. Low-lying cerebellar tonsils at the foramen magnum.  No abnormal extra-axial fluid collections. Basal cisterns are patent.  No skull fracture. The included ocular globes and orbital contents are non-suspicious. The mastoid aircells and included paranasal sinuses are well-aerated.  IMPRESSION: No acute intracranial process.  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   Electronically Signed   By: Awilda Metro   On: 04/26/2015 03:31    Assessment/Plan: 29 y.o.  ITP (idiopathic thrombocytopenic purpura) Unfortunately, at this point, he is steroid refractory. He has been treated with high-dose dexamethasone, Solu-Medrol and prednisone over the past months with no success. He also has minimum response to IVIG. He is to be initiated on immunosuppressive therapy with CellCept He also received platelet transfusion on 4/29 with minimal response  He was admitted on 5/2 due to gum bleeding. He received 1 unit platelet transfusion for a count of 5000 He received IV methylprednisone as well His platelets today are at 8000, will need another unit today to reach platelet count above 20,000. Will continue to monitor  At this point in time, I recommend we proceed with bone marrow aspirate and biopsy. Please see separate note for this. I will also saw him on Nplate.  Gum bleeding  Hematuria  Hematochezia Due to symptomatic, steroid refractory ITP This has resolved. Continue supportive transfusions Cellcept resumed on 5/2 Start Nplate today.  Leukocytosis Likely reactive in the setting of steroids Continue to closely monitor  DVT prophylaxis On mechanical devices  Gastritis Will start PPI. We'll plan on steroid taper within the next 3 days.  Full Code  Discharge planning He will be ready for discharge if platelet count is greater than 20,000 with no bleeding.  04/27/2015  7:56 AM  Marlowe Kays E, PA-C 04/27/2015 Pluma Diniz, MD 04/27/2015

## 2015-04-27 NOTE — Progress Notes (Signed)
[facility] PROGRESS NOTE   John Brennan HYI:[id number] DOB: [birth year] DOA: 04/26/2015 PCP: No PCP Per Patient  HPI/Subjective: Seen with spanish interpreter before the BM biopsy. Feels better today, no overt bleeding, had 3 spots (small ecchymosis) in his mouth yesterday, resolved today.  Assessment/Plan: Principal Problem:   ITP (idiopathic thrombocytopenic purpura) Active Problems:   Thrombocytopenia   Hypokalemia   ITP Likely patient had ITP, was recently in the hospital treated with high-dose steroids and IVIG. Discharge home on 1 mg/kg of prednisone, the platelets were trending down consistently. Admitted back to the hospital after he started to have bleeding, platelets were about 2. Had platelet transfusion, started on CellCept and steroids back. Bone marrow biopsy done today. Transfuse another unit of pheresed platelets.  Hypokalemia This is repleted with oral supplements.  Code Status: Full Code Family Communication: Plan discussed with the patient. Disposition Plan: Remains inpatient Diet: Diet regular Room service appropriate?: Yes; Fluid consistency:: Thin  Consultants:  Dr. Alvy Bimler  Procedures:  None  Antibiotics:  None   Objective: Filed Vitals:   04/27/15 1100  BP: 121/61  Pulse: 84  Temp: 97.9 F (36.6 C)  Resp: 18    Intake/Output Summary (Last 24 hours) at 04/27/15 1121 Last data filed at 04/27/15 0900  Gross per 24 hour  Intake    840 ml  Output   3750 ml  Net  -2910 ml   Filed Weights   04/26/15 0546  Weight: 65.318 kg (144 lb)    Exam: General: Alert and awake, oriented x3, not in any acute distress. HEENT: Ecchymosis involving the mucosal membranes of his mouth CVS: S1-S2 clear, no murmur rubs or gallops Chest: clear to auscultation bilaterally, no wheezing, rales or rhonchi Abdomen: soft nontender, nondistended, normal bowel sounds, no organomegaly Extremities: no cyanosis, clubbing or edema noted  bilaterally Neuro: Cranial nerves II-XII intact, no focal neurological deficits  Data Reviewed: Basic Metabolic Panel:  Recent Labs Lab 04/26/15 0205 04/27/15 0400  NA 133* 129*  K 3.3* 3.8  CL 103 100*  CO2 24 25  GLUCOSE 100* 145*  BUN 23* 17  CREATININE 0.78 0.75  CALCIUM 8.6* 8.8*   Liver Function Tests: No results for input(s): AST, ALT, ALKPHOS, BILITOT, PROT, ALBUMIN in the last 168 hours. No results for input(s): LIPASE, AMYLASE in the last 168 hours. No results for input(s): AMMONIA in the last 168 hours. CBC:  Recent Labs Lab 04/23/15 1120 04/26/15 0205 04/27/15 0400  WBC 13.8* 15.8* 18.6*  NEUTROABS 9.7* 12.0*  --   HGB 16.3 14.6 13.7  HCT 48.1 43.4 41.2  MCV 83.8 83.5 82.2  PLT <2* <5* 8*   Cardiac Enzymes: No results for input(s): CKTOTAL, CKMB, CKMBINDEX, TROPONINI in the last 168 hours. BNP (last 3 results) No results for input(s): BNP in the last 8760 hours.  ProBNP (last 3 results) No results for input(s): PROBNP in the last 8760 hours.  CBG: No results for input(s): GLUCAP in the last 168 hours.  Micro No results found for this or any previous visit (from the past 240 hour(s)).   Studies: Ct Head Wo Contrast  04/26/2015   CLINICAL DATA:  Generalized headache. History of thrombocytopenia, status post platelet transfusion 3 days ago.  EXAM: CT HEAD WITHOUT CONTRAST  TECHNIQUE: Contiguous axial images were obtained from the base of the skull through the vertex without intravenous contrast.  COMPARISON:  None.  FINDINGS: The ventricles and sulci are normal. No intraparenchymal hemorrhage, mass effect nor midline shift. No acute  large vascular territory infarcts. Low-lying cerebellar tonsils at the foramen magnum.  No abnormal extra-axial fluid collections. Basal cisterns are patent.  No skull fracture. The included ocular globes and orbital contents are non-suspicious. The mastoid aircells and included paranasal sinuses are well-aerated.  IMPRESSION:  No acute intracranial process.  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   Electronically Signed   By: Elon Alas   On: 04/26/2015 03:31    Scheduled Meds: . sodium chloride  10 mL/hr Intravenous Once  . sodium chloride   Intravenous Once  . methylPREDNISolone (SOLU-MEDROL) injection  125 mg Intravenous Daily  . mycophenolate  250 mg Oral BID  . pantoprazole  40 mg Oral Daily  . romiPLOStim  1 mcg/kg Subcutaneous Once  . sodium chloride  3 mL Intravenous Q12H   Continuous Infusions:      Time spent: 35 minutes    [facility]  [facility] Pager [phone] If 7PM-7AM, please contact night-coverage at [email or web address], password [email or web address] 04/27/2015, 11:21 AM  LOS: 1 day

## 2015-04-28 ENCOUNTER — Other Ambulatory Visit: Payer: Self-pay | Admitting: Hematology and Oncology

## 2015-04-28 ENCOUNTER — Telehealth: Payer: Self-pay | Admitting: Hematology and Oncology

## 2015-04-28 DIAGNOSIS — E871 Hypo-osmolality and hyponatremia: Secondary | ICD-10-CM

## 2015-04-28 DIAGNOSIS — D72829 Elevated white blood cell count, unspecified: Secondary | ICD-10-CM | POA: Diagnosis present

## 2015-04-28 LAB — CBC
HCT: 39.5 % (ref 39.0–52.0)
Hemoglobin: 13 g/dL (ref 13.0–17.0)
MCH: 27.5 pg (ref 26.0–34.0)
MCHC: 32.9 g/dL (ref 30.0–36.0)
MCV: 83.7 fL (ref 78.0–100.0)
PLATELETS: 27 10*3/uL — AB (ref 150–400)
RBC: 4.72 MIL/uL (ref 4.22–5.81)
RDW: 12.7 % (ref 11.5–15.5)
WBC: 22.2 10*3/uL — ABNORMAL HIGH (ref 4.0–10.5)

## 2015-04-28 LAB — BASIC METABOLIC PANEL
ANION GAP: 10 (ref 5–15)
BUN: 16 mg/dL (ref 6–20)
CALCIUM: 8.9 mg/dL (ref 8.9–10.3)
CO2: 24 mmol/L (ref 22–32)
CREATININE: 0.76 mg/dL (ref 0.61–1.24)
Chloride: 103 mmol/L (ref 101–111)
GFR calc Af Amer: >60 mL/min (ref >60)
GFR calc non Af Amer: >60 mL/min (ref >60)
GLUCOSE: 175 mg/dL — AB (ref 70–99)
POTASSIUM: 3.3 mmol/L — AB (ref 3.5–5.1)
Sodium: 137 mmol/L (ref 135–145)

## 2015-04-28 LAB — PREPARE PLATELET PHERESIS: Unit division: 0

## 2015-04-28 MED ORDER — BISACODYL 5 MG PO TBEC
10.0000 mg | DELAYED_RELEASE_TABLET | Freq: Once | ORAL | Status: AC
  Administered 2015-04-28: 10 mg via ORAL
  Filled 2015-04-28: qty 2

## 2015-04-28 MED ORDER — PREDNISONE 20 MG PO TABS: 60.0000 mg | ORAL_TABLET | Freq: Every day | ORAL | Status: DC

## 2015-04-28 MED ORDER — PANTOPRAZOLE SODIUM 40 MG PO TBEC: 40.0000 mg | DELAYED_RELEASE_TABLET | Freq: Every day | ORAL | Status: DC

## 2015-04-28 MED ORDER — PREDNISONE 50 MG PO TABS
60.0000 mg | ORAL_TABLET | Freq: Every day | ORAL | Status: DC
  Administered 2015-04-28: 60 mg via ORAL
  Filled 2015-04-28 (×2): qty 1

## 2015-04-28 MED ORDER — ACETAMINOPHEN 325 MG PO TABS: 650.0000 mg | ORAL_TABLET | Freq: Four times a day (QID) | ORAL | Status: DC | PRN

## 2015-04-28 NOTE — Progress Notes (Signed)
Pt discharged home in stable condition. Discharge instructions and scripts given to and discussed with patient. Delorise RoyalsJulie Sowell, interpreter, assisted in discharge teaching. Pt verbalized understanding.

## 2015-04-28 NOTE — Telephone Encounter (Signed)
Pt confirmed labs/ov per 05/04 POF..... KJ

## 2015-04-28 NOTE — Care Management Note (Addendum)
Case Management Note  Patient Details  Name: John Brennan MRN: [id number] Date of Birth: Mar 31, 1986  Subjective/Objective:        29 yo admitted with ITP            Action/Plan: From home  Expected Discharge Date:   (unknown)       04/28/15        Expected Discharge Plan:  Home/Self Care  In-House Referral:  Interpreting Services  Discharge planning Services  CM Consult, [facility]  Post Acute Care Choice:    Choice offered to:     DME Arranged:    DME Agency:     HH Arranged:    [place] Agency:     Status of Service:  Completed, signed off  Medicare Important Message Given:    Date Medicare IM Given:    Medicare IM give by:    Date Additional Medicare IM Given:    Additional Medicare Important Message give by:     If discussed at [place] of Stay Meetings, dates discussed:    Additional Comments: Per MD notes pt has not been filling his prescriptions for Cellcept, Protonix and Prednisone. Pt is uninsured and does not have a PCP. This CM met with pt and interpreter at the bedside. Pt entered into the [facility] program and given the [facility] letter to receive his prescriptions all for $3 per script. He was instructed to go to the [facility] as they could order the Cellcept for him so he could have it tomorrow. Pt states that he will do this. Pt was also given [place] information and instructed to call for a follow up appointment so he could get established with a PCP. No other DC needs noted at this time. Pt appreciative of CM assistance.   Lynnell Catalan, RN 04/28/2015, 1:45 PM

## 2015-04-28 NOTE — Progress Notes (Signed)
MD, Patient reported last night that he is a little constipated. Had a BM 5/3 with a small amount of blood present. Could he get a stool softener scheduled please.John KingfisherMills, Maxemiliano Riel [place]

## 2015-04-28 NOTE — Progress Notes (Signed)
John Brennan   DOB:Sep 29, 1986   RU#:[id number]#:[id number]   BJY#:[id number]#:[id number]  Patient Care Team: No Pcp Per Patient as PCP - General (General Practice)  I have seen the patient, examined him and edited the notes as follows  Subjective: Patient seen and examined. No further bleeding issues including gum bleeding, hematuria or hematochezia, all resolved. Denies fevers, chills, night sweats, vision changes, or mucositis. Denies any respiratory complaints. Denies any chest pain or palpitations. Denies abdominal pain. Constipation resolved. Denies lower extremity swelling. Denies any dysuria,  Denies abnormal skin rashes, or neuropathy. Ambulating without difficulty.  Scheduled Meds: . sodium chloride  10 mL/hr Intravenous Once  . methylPREDNISolone (SOLU-MEDROL) injection  125 mg Intravenous Daily  . mycophenolate  250 mg Oral BID  . pantoprazole  40 mg Oral Daily  . sodium chloride  3 mL Intravenous Q12H   Continuous Infusions:  PRN Meds:sodium chloride, acetaminophen **OR** acetaminophen, alum & mag hydroxide-simeth, ondansetron **OR** ondansetron (ZOFRAN) IV, oxyCODONE, sodium chloride   Objective:  Filed Vitals:   04/28/15 0541  BP: 123/63  Pulse: 50  Temp: 97.7 F (36.5 C)  Resp: 16      Intake/Output Summary (Last 24 hours) at 04/28/15 0758 Last data filed at 04/28/15 0541  Gross per 24 hour  Intake   1250 ml  Output   2150 ml  Net   -900 ml    ECOG PERFORMANCE STATUS:1  GENERAL:alert, no distress and comfortable SKIN: skin color, texture, turgor are normal, no rashes or significant lesions EYES: normal, conjunctiva are pink and non-injected, sclera clear OROPHARYNX:no exudate, no erythema and lips. Ecchymmoses noted in gum and tongue are now resolved NECK: supple, thyroid normal size, non-tender, without nodularity LYMPH:  no palpable lymphadenopathy in the cervical, axillary or inguinal LUNGS: clear to auscultation and percussion with normal breathing effort HEART: regular rate  & rhythm and no murmurs and no lower extremity edema ABDOMEN: soft, non-tender and normal bowel sounds Musculoskeletal:no cyanosis of digits and no clubbing  PSYCH: alert & oriented x 3 with fluent speech NEURO: no focal motor/sensory deficits    CBG (last 3)  No results for input(s): GLUCAP in the last 72 hours.   Labs:   Recent Labs Lab 04/23/15 1120 04/26/15 0205 04/27/15 0400 04/28/15 0403  WBC 13.8* 15.8* 18.6* 22.2*  HGB 16.3 14.6 13.7 13.0  HCT 48.1 43.4 41.2 39.5  PLT <2* <5* 8* 27*  MCV 83.8 83.5 82.2 83.7  MCH 28.4 28.1 27.3 27.5  MCHC 33.9 33.6 33.3 32.9  RDW 12.9 12.7 12.6 12.7  LYMPHSABS 2.7 2.0  --   --   MONOABS 1.1* 1.3*  --   --   EOSABS 0.3 0.6  --   --   BASOSABS 0.1 0.0  --   --      Chemistries:    Recent Labs Lab 04/26/15 0205 04/27/15 0400  NA 133* 129*  K 3.3* 3.8  CL 103 100*  CO2 24 25  GLUCOSE 100* 145*  BUN 23* 17  CREATININE 0.78 0.75  CALCIUM 8.6* 8.8*    Coagulation profile  Recent Labs Lab 04/26/15 0205  INR 0.92    Imaging Studies:  No results found.  Assessment/Plan: 29 y.o.  ITP (idiopathic thrombocytopenic purpura) Unfortunately, at this point, he is steroid refractory. He has been treated with high-dose dexamethasone, Solu-Medrol and prednisone over the past months with no success. He also has minimum response to IVIG. He also received platelet transfusion on 4/29 with minimal response  He was admitted  on 5/2 due to gum bleeding. He received 1 unit platelet transfusion for a count of 5000 He received IV methylprednisone as well He received another unit of platelets on 5/2 rising from 8,000 to 27,000 today He had a bone marrow aspirate and biopsy on 5/3 with results, including flow cytometry and cytogenetics currently pending.  He is to continue his Immunosuppressive therapy with Cellcept and Prednisone at 60 mg daily upon Discharge  Nplate initiated on 5/3, to continue as outpatient He is to be discharged  today. Will see patient in office on Friday May 6 with labs  Gum bleeding  Hematuria Hematochezia Due to symptomatic, steroid refractory ITP This has resolved. Continue supportive transfusions Cellcept resumed on 5/2  Started Nplate on 5/3  Leukocytosis Likely reactive in the setting of steroids Continue to closely monitor  DVT prophylaxis On mechanical devices  Gastritis, resolved He was started on PPI.  Will plan on steroid taper within the next 3 days.  Full Code  Discharge planning He may be ready for discharge today as  platelet count is greater than 20,000 with no bleeding.  04/28/2015  7:58 AM  Marlowe KaysWERTMAN,SARA E, PA-C 04/28/2015 John Cousar, MD 04/28/2015

## 2015-04-28 NOTE — Discharge Summary (Signed)
Physician Discharge Summary  John Brennan XHF:[id number] DOB: Jun 22, 1986 DOA: 04/26/2015  PCP: No PCP Per Patient  Admit date: 04/26/2015 Discharge date: 04/28/2015  Time spent: 65 minutes  Recommendations for Outpatient Follow-up:  1. Follow-up with Dr. Alvy Bimler of hematology on Friday, 04/30/2015. On follow-up patient will need a comprehensive metabolic profile done to follow-up on electrolytes and renal function. Patient will also need a CBC with differential done to follow-up on his counts.  Discharge Diagnoses:  Principal Problem:   ITP (idiopathic thrombocytopenic purpura) Active Problems:   Thrombocytopenia   Hypokalemia   Leukocytosis   Hyponatremia   Discharge Condition: Stable and improved  Diet recommendation: Regular  Filed Weights   04/26/15 0546  Weight: 65.318 kg (144 lb)    History of present illness:  Per Dr. Grayland Ormond Brennan is a 29 y.o. male with recent diagnosis of ITP ( 04/19 Admission) who presented to the ED with complaints of gum bleeding and headache at 11 pm on the night of admission and was found to have a platelet count of <5 in the ED. He denied any hemtemesis or melena or hematochezia or hematuria. He denied any ABD pain. He was transfused platelets last on 04/29. A Ct scan of the head was performed and was negative for acute findings. Hematology/Oncology On Call was consulted and recommended a dose of IV Decadron to be given and a transfusion of plateletes were ordered and Dr Alvy Bimler will see patient this AM. Of note: He was to start Cellcept and Prednisone and Protonix but did not pick up the prescriptions.   Interview of this patient was performed with the use of the Telephone Translation Service.    Hospital Course:  #1 idiopathic thrombocytopenic purpura Patient was recently diagnosed with ITP treated with high-dose steroids and IVIG. Patient was discharged home on prednisone and was followed by the hematology oncology  clinic. Patient was noted to have platelets that were trending down consistently. Patient presented this hospitalization with gum bleeding and headache. CT of the head which was done was negative. Patient denied any hematemesis or melena or hematochezia or hematuria. Patient was admitted. Patient noted to have a platelet count of less than 5. Patient was transfused 1 unit of platelets with improvement in his platelet count. Patient was also given IV Decadron and started back on CellCept and the PPI. Patient was seen in consultation by hematology/oncology Dr. Alvy Bimler during the hospitalization. Patient was noted to have steroid refractory ITP had been treated with high-dose dexamethasone Solu-Medrol and prednisone over the past several months with no success. Patient was also noted to have minimal response to IVIG. Patient subsequently underwent a bone marrow biopsy with results pending at time of discharge. Patient subsequently had an increase in his platelet count from less than 5000 to 27,000 on day of discharge. Patient did not have any further bleeding. It was recommended that patient continue on his immunosuppressive therapy with CellCept twice daily and prednisone 60 mg daily.Nplate was started on 04/27/2015 and will be continued as outpatient. Patient will be discharged in stable and improved condition and will follow-up with hematology Dr. Alvy Bimler as outpatient on Friday May to 6 2016 and which time labs will be repeated.  #2 gum bleeding Secondary to problem #1. Resolved.  #3 leukocytosis Felt to be secondary reactive leukocytosis secondary to steroids. Patient remained afebrile throughout the hospitalization. Patient will be discharged in stable condition. Outpatient follow-up.  #4 hypokalemia Patient noted to be hypokalemic on admission. Patient's potassium was repleted.  #  5 hyponatremia Patient noted to be mildly hyponatremic. Patient remained asymptomatic. Felt to be likely secondary to  hypovolemic hyponatremia. Patient was placed on IV fluids. Outpatient follow-up.  #6 gastritis Patient was started on a PPI with resolution of his gastritis. Patient be discharged on Protonix daily.  Procedures:  1 unit platelets 5/2-02/2015  BM biopsy and aspirate Dr Alvy Bimler 04/27/15  CT head 04/26/15  Consultations:  Oncology: Dr Alvy Bimler: 04/26/15  Discharge Exam: Filed Vitals:   04/28/15 0541  BP: 123/63  Pulse: 50  Temp: 97.7 F (36.5 C)  Resp: 16    General: NAD Cardiovascular: RRR Respiratory: CTAB  Discharge Instructions   Discharge Instructions    Diet general    Complete by:  As directed      Discharge instructions    Complete by:  As directed   Follow up with Dr Alvy Bimler as scheduled on Friday May 6th 2016. No NSAIDS, no motrin, no ibuprofen.     Increase activity slowly    Complete by:  As directed           Current Discharge Medication List    START taking these medications   Details  acetaminophen (TYLENOL) 325 MG tablet Take 2 tablets (650 mg total) by mouth every 6 (six) hours as needed for mild pain, fever or headache (or Fever >/= 101).      CONTINUE these medications which have CHANGED   Details  pantoprazole (PROTONIX) 40 MG tablet Take 1 tablet (40 mg total) by mouth daily. Qty: 30 tablet, Refills: 0   Associated Diagnoses: Thrombocytopenia; Transaminasemia      CONTINUE these medications which have NOT CHANGED   Details  mycophenolate (CELLCEPT) 250 MG capsule Take 1 capsule (250 mg total) by mouth 2 (two) times daily. Qty: 60 capsule, Refills: 3    predniSONE (DELTASONE) 20 MG tablet Take 3 tablets (60 mg total) by mouth daily with breakfast. Qty: 90 tablet, Refills: 3   Associated Diagnoses: ITP (idiopathic thrombocytopenic purpura)       No Known Allergies Follow-up Information    Follow up with [facility], NI, MD On 04/30/2015.   Specialty:  Hematology and Oncology   Contact information:   [place]  [id number] [phone]        The results of significant diagnostics from this hospitalization (including imaging, microbiology, ancillary and laboratory) are listed below for reference.    Significant Diagnostic Studies: Ct Head Wo Contrast  04/26/2015   CLINICAL DATA:  Generalized headache. History of thrombocytopenia, status post platelet transfusion 3 days ago.  EXAM: CT HEAD WITHOUT CONTRAST  TECHNIQUE: Contiguous axial images were obtained from the base of the skull through the vertex without intravenous contrast.  COMPARISON:  None.  FINDINGS: The ventricles and sulci are normal. No intraparenchymal hemorrhage, mass effect nor midline shift. No acute large vascular territory infarcts. Low-lying cerebellar tonsils at the foramen magnum.  No abnormal extra-axial fluid collections. Basal cisterns are patent.  No skull fracture. The included ocular globes and orbital contents are non-suspicious. The mastoid aircells and included paranasal sinuses are well-aerated.  IMPRESSION: No acute intracranial process.  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   Electronically Signed   By: Elon Alas   On: 04/26/2015 03:31    Microbiology: No results found for this or any previous visit (from the past 240 hour(s)).   Labs: Basic Metabolic Panel:  Recent Labs Lab 04/26/15 0205 04/27/15 0400 04/28/15 0845  NA 133* 129* PENDING  K 3.3* 3.8 PENDING  CL 103 100* PENDING  CO2 24 25 PENDING  GLUCOSE 100* 145* 175*  BUN 23* 17 16  CREATININE 0.78 0.75 0.76  CALCIUM 8.6* 8.8* PENDING   Liver Function Tests: No results for input(s): AST, ALT, ALKPHOS, BILITOT, PROT, ALBUMIN in the last 168 hours. No results for input(s): LIPASE, AMYLASE in the last 168 hours. No results for input(s): AMMONIA in the last 168 hours. CBC:  Recent Labs Lab 04/23/15 1120 04/26/15 0205 04/27/15 0400  04/28/15 0403  WBC 13.8* 15.8* 18.6* 22.2*  NEUTROABS 9.7* 12.0*  --   --   HGB 16.3 14.6 13.7 13.0  HCT 48.1 43.4 41.2 39.5  MCV 83.8 83.5 82.2 83.7  PLT <2* <5* 8* 27*   Cardiac Enzymes: No results for input(s): CKTOTAL, CKMB, CKMBINDEX, TROPONINI in the last 168 hours. BNP: BNP (last 3 results) No results for input(s): BNP in the last 8760 hours.  ProBNP (last 3 results) No results for input(s): PROBNP in the last 8760 hours.  CBG: No results for input(s): GLUCAP in the last 168 hours.     SignedIrine Seal MD [facility] 04/28/2015, 9:55 AM

## 2015-04-28 NOTE — Telephone Encounter (Signed)
per pof to sch pt appt-gave pt copy of sch(Dr. Bertis RuddyGorsuch)

## 2015-04-28 NOTE — Telephone Encounter (Deleted)
per pof ot sch pt appt-Dr Bertis RuddyGorsuch gave pt copy of sch

## 2015-04-29 ENCOUNTER — Other Ambulatory Visit: Payer: Self-pay | Admitting: Hematology and Oncology

## 2015-04-29 DIAGNOSIS — D693 Immune thrombocytopenic purpura: Secondary | ICD-10-CM

## 2015-04-30 ENCOUNTER — Ambulatory Visit (HOSPITAL_BASED_OUTPATIENT_CLINIC_OR_DEPARTMENT_OTHER): Payer: Self-pay | Admitting: Hematology and Oncology

## 2015-04-30 ENCOUNTER — Other Ambulatory Visit (HOSPITAL_BASED_OUTPATIENT_CLINIC_OR_DEPARTMENT_OTHER): Payer: Self-pay

## 2015-04-30 ENCOUNTER — Ambulatory Visit (HOSPITAL_COMMUNITY)
Admission: RE | Admit: 2015-04-30 | Discharge: 2015-04-30 | Disposition: A | Discharge status: Home / Self Care | Payer: Self-pay | Source: Ambulatory Visit | Attending: Hematology and Oncology | Admitting: Hematology and Oncology

## 2015-04-30 ENCOUNTER — Telehealth: Payer: Self-pay | Admitting: Hematology and Oncology

## 2015-04-30 ENCOUNTER — Encounter: Payer: Self-pay | Admitting: Hematology and Oncology

## 2015-04-30 VITALS — BP 133/64 | HR 78 | Temp 98.3°F | Resp 20 | Ht 62.0 in | Wt 140.0 lb

## 2015-04-30 VITALS — BP 116/58 | HR 65 | Temp 98.2°F | Resp 18

## 2015-04-30 DIAGNOSIS — R7401 Elevation of levels of liver transaminase levels: Secondary | ICD-10-CM

## 2015-04-30 DIAGNOSIS — R740 Nonspecific elevation of levels of transaminase and lactic acid dehydrogenase [LDH]: Secondary | ICD-10-CM

## 2015-04-30 DIAGNOSIS — D696 Thrombocytopenia, unspecified: Secondary | ICD-10-CM

## 2015-04-30 DIAGNOSIS — D72829 Elevated white blood cell count, unspecified: Secondary | ICD-10-CM

## 2015-04-30 DIAGNOSIS — D693 Immune thrombocytopenic purpura: Secondary | ICD-10-CM

## 2015-04-30 DIAGNOSIS — D72819 Decreased white blood cell count, unspecified: Secondary | ICD-10-CM

## 2015-04-30 LAB — CBC WITH DIFFERENTIAL/PLATELET
BASO%: 0.1 % (ref 0.0–2.0)
Basophils Absolute: 0 10*3/uL (ref 0.0–0.1)
EOS ABS: 0.1 10*3/uL (ref 0.0–0.5)
EOS%: 0.7 % (ref 0.0–7.0)
HCT: 42.8 % (ref 38.4–49.9)
HEMOGLOBIN: 14.6 g/dL (ref 13.0–17.1)
LYMPH%: 10.7 % — AB (ref 14.0–49.0)
MCH: 28.3 pg (ref 27.2–33.4)
MCHC: 34.1 g/dL (ref 32.0–36.0)
MCV: 83.1 fL (ref 79.3–98.0)
MONO#: 0.8 10*3/uL (ref 0.1–0.9)
MONO%: 4.5 % (ref 0.0–14.0)
NEUT%: 84 % — ABNORMAL HIGH (ref 39.0–75.0)
NEUTROS ABS: 15.8 10*3/uL — AB (ref 1.5–6.5)
PLATELETS: 3 10*3/uL — AB (ref 140–400)
RBC: 5.15 10*6/uL (ref 4.20–5.82)
RDW: 12.7 % (ref 11.0–14.6)
WBC: 18.8 10*3/uL — ABNORMAL HIGH (ref 4.0–10.3)
lymph#: 2 10*3/uL (ref 0.9–3.3)

## 2015-04-30 LAB — COMPREHENSIVE METABOLIC PANEL (CC13)
ALT: 92 U/L — AB (ref 0–55)
AST: 31 U/L (ref 5–34)
Albumin: 3.6 g/dL (ref 3.5–5.0)
Alkaline Phosphatase: 68 U/L (ref 40–150)
Anion Gap: 11 mEq/L (ref 3–11)
BILIRUBIN TOTAL: 0.4 mg/dL (ref 0.20–1.20)
BUN: 13.6 mg/dL (ref 7.0–26.0)
CO2: 24 mEq/L (ref 22–29)
Calcium: 8.6 mg/dL (ref 8.4–10.4)
Chloride: 103 mEq/L (ref 98–109)
Creatinine: 1.1 mg/dL (ref 0.7–1.3)
EGFR: >90 mL/min/{1.73_m2} (ref >90)
GLUCOSE: 129 mg/dL (ref 70–140)
POTASSIUM: 3.7 meq/L (ref 3.5–5.1)
Sodium: 139 mEq/L (ref 136–145)
Total Protein: 7.5 g/dL (ref 6.4–8.3)

## 2015-04-30 MED ORDER — MYCOPHENOLATE MOFETIL 250 MG PO CAPS: 500.0000 mg | ORAL_CAPSULE | Freq: Two times a day (BID) | ORAL | Status: DC

## 2015-04-30 MED ORDER — ACETAMINOPHEN 325 MG PO TABS
650.0000 mg | ORAL_TABLET | Freq: Once | ORAL | Status: AC
  Administered 2015-04-30: 650 mg via ORAL
  Filled 2015-04-30: qty 2

## 2015-04-30 MED ORDER — HEPARIN SOD (PORK) LOCK FLUSH 100 UNIT/ML IV SOLN: 500.0000 [IU] | Freq: Every day | INTRAVENOUS | Status: DC | PRN

## 2015-04-30 MED ORDER — SODIUM CHLORIDE 0.9 % IV SOLN
250.0000 mL | Freq: Once | INTRAVENOUS | Status: AC
  Administered 2015-04-30: 250 mL via INTRAVENOUS

## 2015-04-30 MED ORDER — HEPARIN SOD (PORK) LOCK FLUSH 100 UNIT/ML IV SOLN: 250.0000 [IU] | INTRAVENOUS | Status: DC | PRN

## 2015-04-30 MED ORDER — DIPHENHYDRAMINE HCL 25 MG PO CAPS
25.0000 mg | ORAL_CAPSULE | Freq: Once | ORAL | Status: AC
  Administered 2015-04-30: 25 mg via ORAL
  Filled 2015-04-30: qty 1

## 2015-04-30 MED ORDER — SODIUM CHLORIDE 0.9 % IJ SOLN: 3.0000 mL | INTRAMUSCULAR | Status: DC | PRN

## 2015-04-30 MED ORDER — SODIUM CHLORIDE 0.9 % IJ SOLN
10.0000 mL | INTRAMUSCULAR | Status: AC | PRN
  Administered 2015-04-30: 10 mL

## 2015-04-30 NOTE — Procedures (Signed)
Diagnosis: Idiopathic thrombocytopenia purpura 287.31 MD: Bertis RuddyGorsuch Procedure note: IV started, Pre blood medications given, 2 unit of platelets given, IV removed Condition during procedure: Tolerated well Condition after procedure: Alert, oriented, and ambulatory. Accompanied by family member.

## 2015-04-30 NOTE — Assessment & Plan Note (Signed)
This is due to prednisone therapy. Observe.

## 2015-04-30 NOTE — Assessment & Plan Note (Signed)
This is intermittent in nature. Ultrasound and MRI of the abdomen excluded cancer. I recommend the patient to stay away from fatty food. This is likely due to fatty liver disease. I recommended against alcohol intake.

## 2015-04-30 NOTE — Telephone Encounter (Signed)
Gave and printed appt sched and avs for pt for May and June....added tx.  °

## 2015-04-30 NOTE — Assessment & Plan Note (Signed)
Repeat bone marrow aspirate and biopsy confirmed diagnosis of ITP. I recommend he continues on 40 mg prednisone. I want to increase CellCept to 500 mg twice a day. He will also come here on a weekly basis to get Nplate injection. Today, I will give him platelet transfusion. Due to the severity of low platelet count, I recommend 2 units of platelets today. We discussed some of the risks, benefits, and alternatives of platelets transfusions. The patient is symptomatic from low platelet counts with bruising/bleeding/at high risk of life-threatening bleeding and the platelet count is critically low.  Some of the side-effects to be expected including risks of transfusion reactions, chills, infection, syndrome of volume overload and risk of hospitalization from various reasons and the patient is willing to proceed and went ahead to sign consent today. He will return twice a week to get blood work monitoring and platelet transfusion as needed. I will see him back in 2 weeks.

## 2015-04-30 NOTE — Progress Notes (Signed)
[name] OFFICE PROGRESS NOTE  No PCP Per Patient SUMMARY OF HEMATOLOGIC HISTORY:  John Brennan 29 y.o. was recently diagnosed with severe ITP. He was recently hospitalized for similar reason of in April 2016, twice, and had received IVIG and corticosteroid therapy. Please see my initial consult note dated 03/26/2015 for further details. The patient was started on IVIG, dexamethasone, Solu-Medrol and prednisone. He is consider steroid refractory. On 04/23/2015, he is started on CellCept 250 mg twice a day From 04/30/2015 to 04/28/2015, he was readmitted to the hospital due to bleeding. Bone marrow aspirate and biopsy performed on 04/27/2015 confirmed diagnosis of ITP INTERVAL HISTORY:Spanish interpreter is present John Brennan 29 y.o. male returns for further follow-up. He is compliant taking his medications as prescribed. He complained of intermittent headaches. He bruises easily. The patient denies any recent signs or symptoms of bleeding such as spontaneous epistaxis, hematuria or hematochezia.   I have reviewed the past medical history, past surgical history, social history and family history with the patient and they are unchanged from previous note.  ALLERGIES:  has No Known Allergies.  MEDICATIONS:  Current Outpatient Prescriptions  Medication Sig Dispense Refill  . acetaminophen (TYLENOL) 325 MG tablet Take 2 tablets (650 mg total) by mouth every 6 (six) hours as needed for mild pain, fever or headache (or Fever >/= 101).    . mycophenolate (CELLCEPT) 250 MG capsule Take 2 capsules (500 mg total) by mouth 2 (two) times daily. 60 capsule 3  . pantoprazole (PROTONIX) 40 MG tablet Take 1 tablet (40 mg total) by mouth daily. 30 tablet 0  . predniSONE (DELTASONE) 20 MG tablet Take 3 tablets (60 mg total) by mouth daily with breakfast. 90 tablet 0   No current facility-administered medications for this visit.   Facility-Administered Medications Ordered in  Other Visits  Medication Dose Route Frequency Provider Last Rate Last Dose  . heparin lock flush 100 unit/mL  500 Units Intracatheter Daily PRN Heath Lark, MD      . heparin lock flush 100 unit/mL  250 Units Intracatheter PRN Heath Lark, MD      . sodium chloride 0.9 % injection 3 mL  3 mL Intracatheter PRN Heath Lark, MD         REVIEW OF SYSTEMS:   Constitutional: Denies fevers, chills or night sweats Eyes: Denies blurriness of vision Ears, nose, mouth, throat, and face: Denies mucositis or sore throat Respiratory: Denies cough, dyspnea or wheezes Cardiovascular: Denies palpitation, chest discomfort or lower extremity swelling Gastrointestinal:  Denies nausea, heartburn or change in bowel habits Skin: Denies abnormal skin rashes Lymphatics: Denies new lymphadenopathy  Neurological:Denies numbness, tingling or new weaknesses Behavioral/Psych: Mood is stable, no new changes  All other systems were reviewed with the patient and are negative.  PHYSICAL EXAMINATION: ECOG PERFORMANCE STATUS: 1 - Symptomatic but completely ambulatory  Filed Vitals:   04/30/15 0955  BP: 133/64  Pulse: 78  Temp: 98.3 F (36.8 C)  Resp: 20   Filed Weights   04/30/15 0955  Weight: 140 lb (63.504 kg)    GENERAL:alert, no distress and comfortable SKIN: skin bruises. EYES: normal, Conjunctiva are pink and non-injected, sclera clear OROPHARYNX:no exudate, no erythema and lips, buccal mucosa, and tongue normal  Musculoskeletal:no cyanosis of digits and no clubbing  NEURO: alert & oriented x 3 with fluent speech, no focal motor/sensory deficits  LABORATORY DATA:  I have reviewed the data as listed Results for orders placed or performed in visit on 04/30/15 (from the  past 48 hour(s))  CBC with Differential/Platelet     Status: Abnormal   Collection Time: 04/30/15  9:39 AM  Result Value Ref Range   WBC 18.8 (H) 4.0 - 10.3 10e3/uL   NEUT# 15.8 (H) 1.5 - 6.5 10e3/uL   HGB 14.6 13.0 - 17.1 g/dL   HCT  42.8 38.4 - 49.9 %   Platelets 3 (LL) 140 - 400 10e3/uL   MCV 83.1 79.3 - 98.0 fL   MCH 28.3 27.2 - 33.4 pg   MCHC 34.1 32.0 - 36.0 g/dL   RBC 5.15 4.20 - 5.82 10e6/uL   RDW 12.7 11.0 - 14.6 %   lymph# 2.0 0.9 - 3.3 10e3/uL   MONO# 0.8 0.1 - 0.9 10e3/uL   Eosinophils Absolute 0.1 0.0 - 0.5 10e3/uL   Basophils Absolute 0.0 0.0 - 0.1 10e3/uL   NEUT% 84.0 (H) 39.0 - 75.0 %   LYMPH% 10.7 (L) 14.0 - 49.0 %   MONO% 4.5 0.0 - 14.0 %   EOS% 0.7 0.0 - 7.0 %   BASO% 0.1 0.0 - 2.0 %  Comprehensive metabolic panel     Status: Abnormal   Collection Time: 04/30/15  9:39 AM  Result Value Ref Range   Sodium 139 136 - 145 mEq/L   Potassium 3.7 3.5 - 5.1 mEq/L   Chloride 103 98 - 109 mEq/L   CO2 24 22 - 29 mEq/L   Glucose 129 70 - 140 mg/dl   BUN 13.6 7.0 - 26.0 mg/dL   Creatinine 1.1 0.7 - 1.3 mg/dL   Total Bilirubin 0.40 0.20 - 1.20 mg/dL   Alkaline Phosphatase 68 40 - 150 U/L   AST 31 5 - 34 U/L   ALT 92 (H) 0 - 55 U/L   Total Protein 7.5 6.4 - 8.3 g/dL   Albumin 3.6 3.5 - 5.0 g/dL   Calcium 8.6 8.4 - 10.4 mg/dL   Anion Gap 11 3 - 11 mEq/L   EGFR >90 >90 ml/min/1.73 m2    Comment: eGFR is calculated using the CKD-EPI Creatinine Equation (2009)    Lab Results  Component Value Date   WBC 18.8* 04/30/2015   HGB 14.6 04/30/2015   HCT 42.8 04/30/2015   MCV 83.1 04/30/2015   PLT 3* 04/30/2015   I have reviewed his bone marrow aspirate and biopsy with the pathologist. ASSESSMENT & PLAN:  ITP (idiopathic thrombocytopenic purpura)  Repeat bone marrow aspirate and biopsy confirmed diagnosis of ITP. I recommend he continues on 40 mg prednisone. I want to increase CellCept to 500 mg twice a day. He will also come here on a weekly basis to get Nplate injection. Today, I will give him platelet transfusion. Due to the severity of low platelet count, I recommend 2 units of platelets today. We discussed some of the risks, benefits, and alternatives of platelets transfusions. The patient is  symptomatic from low platelet counts with bruising/bleeding/at high risk of life-threatening bleeding and the platelet count is critically low.  Some of the side-effects to be expected including risks of transfusion reactions, chills, infection, syndrome of volume overload and risk of hospitalization from various reasons and the patient is willing to proceed and went ahead to sign consent today. He will return twice a week to get blood work monitoring and platelet transfusion as needed. I will see him back in 2 weeks.   Leukocytosis This is due to prednisone therapy. Observe.   Transaminasemia This is intermittent in nature. Ultrasound and MRI of the abdomen excluded cancer.  I recommend the patient to stay away from fatty food. This is likely due to fatty liver disease. I recommended against alcohol intake.   His other family member is also present and have multiple questions. I addressed the pathophysiology of ITP and discuss other treatment options including splenectomy.  All questions were answered. The patient knows to call the clinic with any problems, questions or concerns. No barriers to learning was detected.  I spent 30 minutes counseling the patient face to face. The total time spent in the appointment was 40 minutes and more than 50% was on counseling.     [facility], Connelly [place], MD 5/6/20163:16 PM

## 2015-05-01 LAB — PREPARE PLATELET PHERESIS
Unit division: 0
Unit division: 0

## 2015-05-04 ENCOUNTER — Ambulatory Visit (HOSPITAL_BASED_OUTPATIENT_CLINIC_OR_DEPARTMENT_OTHER): Payer: Self-pay

## 2015-05-04 ENCOUNTER — Other Ambulatory Visit: Payer: Self-pay | Admitting: *Deleted

## 2015-05-04 ENCOUNTER — Other Ambulatory Visit (HOSPITAL_BASED_OUTPATIENT_CLINIC_OR_DEPARTMENT_OTHER): Payer: Self-pay

## 2015-05-04 VITALS — BP 121/56 | HR 80 | Temp 97.8°F | Resp 18

## 2015-05-04 DIAGNOSIS — D693 Immune thrombocytopenic purpura: Secondary | ICD-10-CM

## 2015-05-04 DIAGNOSIS — D72829 Elevated white blood cell count, unspecified: Secondary | ICD-10-CM

## 2015-05-04 DIAGNOSIS — D696 Thrombocytopenia, unspecified: Secondary | ICD-10-CM

## 2015-05-04 DIAGNOSIS — R740 Nonspecific elevation of levels of transaminase and lactic acid dehydrogenase [LDH]: Secondary | ICD-10-CM

## 2015-05-04 LAB — CBC WITH DIFFERENTIAL/PLATELET
BASO%: 0.4 % (ref 0.0–2.0)
BASOS ABS: 0.1 10*3/uL (ref 0.0–0.1)
EOS%: 0.1 % (ref 0.0–7.0)
Eosinophils Absolute: 0 10*3/uL (ref 0.0–0.5)
HEMATOCRIT: 46.1 % (ref 38.4–49.9)
HEMOGLOBIN: 15 g/dL (ref 13.0–17.1)
LYMPH%: 6.7 % — AB (ref 14.0–49.0)
MCH: 26.8 pg — ABNORMAL LOW (ref 27.2–33.4)
MCHC: 32.6 g/dL (ref 32.0–36.0)
MCV: 82.4 fL (ref 79.3–98.0)
MONO#: 0.3 10*3/uL (ref 0.1–0.9)
MONO%: 1.6 % (ref 0.0–14.0)
NEUT#: 18.9 10*3/uL — ABNORMAL HIGH (ref 1.5–6.5)
NEUT%: 91.2 % — AB (ref 39.0–75.0)
Platelets: 8 10*3/uL — CL (ref 140–400)
RBC: 5.59 10*6/uL (ref 4.20–5.82)
RDW: 13.3 % (ref 11.0–14.6)
WBC: 20.7 10*3/uL — AB (ref 4.0–10.3)
lymph#: 1.4 10*3/uL (ref 0.9–3.3)

## 2015-05-04 LAB — TECHNOLOGIST REVIEW

## 2015-05-04 LAB — COMPREHENSIVE METABOLIC PANEL (CC13)
ALBUMIN: 4.1 g/dL (ref 3.5–5.0)
ALK PHOS: 99 U/L (ref 40–150)
ALT: 264 U/L — AB (ref 0–55)
ANION GAP: 12 meq/L — AB (ref 3–11)
AST: 83 U/L — ABNORMAL HIGH (ref 5–34)
BILIRUBIN TOTAL: 0.48 mg/dL (ref 0.20–1.20)
BUN: 17.8 mg/dL (ref 7.0–26.0)
CO2: 21 meq/L — AB (ref 22–29)
CREATININE: 0.8 mg/dL (ref 0.7–1.3)
Calcium: 9.4 mg/dL (ref 8.4–10.4)
Chloride: 105 mEq/L (ref 98–109)
EGFR: >90 mL/min/{1.73_m2} (ref >90)
Glucose: 143 mg/dl — ABNORMAL HIGH (ref 70–140)
Potassium: 4.4 mEq/L (ref 3.5–5.1)
SODIUM: 139 meq/L (ref 136–145)
TOTAL PROTEIN: 8.1 g/dL (ref 6.4–8.3)

## 2015-05-04 LAB — FERRITIN CHCC: Ferritin: 77 ng/ml (ref 22–316)

## 2015-05-04 LAB — CHROMOSOME ANALYSIS, BONE MARROW

## 2015-05-04 MED ORDER — DIPHENHYDRAMINE HCL 25 MG PO CAPS
ORAL_CAPSULE | ORAL | Status: AC
  Filled 2015-05-04: qty 1

## 2015-05-04 MED ORDER — ACETAMINOPHEN 325 MG PO TABS
ORAL_TABLET | ORAL | Status: AC
  Filled 2015-05-04: qty 2

## 2015-05-04 MED ORDER — SODIUM CHLORIDE 0.9 % IV SOLN
250.0000 mL | Freq: Once | INTRAVENOUS | Status: AC
  Administered 2015-05-04: 250 mL via INTRAVENOUS

## 2015-05-04 MED ORDER — ACETAMINOPHEN 325 MG PO TABS
650.0000 mg | ORAL_TABLET | Freq: Once | ORAL | Status: AC
  Administered 2015-05-04: 650 mg via ORAL

## 2015-05-04 MED ORDER — DIPHENHYDRAMINE HCL 25 MG PO CAPS
25.0000 mg | ORAL_CAPSULE | Freq: Once | ORAL | Status: AC
  Administered 2015-05-04: 25 mg via ORAL

## 2015-05-04 NOTE — Patient Instructions (Signed)
Platelet Transfusion Information °This is information about transfusions of platelets. Platelets are tiny cells made by the bone marrow and found in the blood. When a blood vessel is damaged, platelets rush to the damaged area to help form a clot. This begins the healing process. When platelets get very low, your blood may have trouble clotting. This may be from: °· Illness. °· Blood disorder. °· Chemotherapy to treat cancer. °Often, lower platelet counts do not cause problems.  °Platelets usually last for 7 to 10 days. If they are not used in an injury, they are broken down by the liver or spleen. °Symptoms of low platelet count include: °· Nosebleeds. °· Bleeding gums. °· Heavy periods. °· Bruising and tiny blood spots in the skin. °¨ Pinpoint spots of bleeding (petechiae). °¨ Larger bruises (purpura). °· Bleeding can be more serious if it happens in the brain or bowel. °Platelet transfusions are often used to keep the platelet count at an acceptable level. Serious bleeding due to low platelets is uncommon. °RISKS AND COMPLICATIONS °Severe side effects from platelet transfusions are uncommon. Minor reactions may include: °· Itching. °· Rashes. °· High temperature and shivering. °Medications are available to stop transfusion reactions. Let your health care provider know if you develop any of the above problems.  °If you are having platelet transfusions frequently, they may get less effective. This is called becoming refractory to platelets. It is uncommon. This can happen from non-immune causes and immune causes. Non-immune causes include: °· High temperatures. °· Some medications. °· An enlarged spleen. °Immune causes happen when your body discovers the platelets are not your own and begins making antibodies against them. The antibodies kill the platelets quickly. Even with platelet transfusions, you may still notice problems with bleeding or bruising. Let your health care providers know about this. Other things  can be done to help if this happens.  °BEFORE THE PROCEDURE  °· Your health care provider will check your platelet count regularly. °· If the platelet count is too low, it may be necessary to have a platelet transfusion. °· This is more important before certain procedures with a risk of bleeding, such as a spinal tap. °· Platelet transfusion reduces the risk of bleeding during or after the procedure. °· Except in emergencies, giving a transfusion requires a written consent. °Before blood is taken from a donor, a complete history is taken to make sure the person has no history of previous diseases, nor engages in risky social behavior. Examples of this are intravenous drug use or sexual activity with multiple partners. This could lead to infected blood or blood products being used. This history is taken in spite of the extensive testing to make sure the blood is safe. All blood products transfused are tested to make sure it is a match for the person getting the blood. It is also checked for infections. Blood is the safest it has ever been. The risk of getting an infection is very low. °PROCEDURE °· The platelets are stored in small plastic bags that are kept at a low temperature. °· Each bag is called a unit and sometimes two units are given. They are given through an intravenous line by drip infusion over about one-half hour. °· Usually blood is collected from multiple people to get enough to transfuse. °· Sometimes, the platelets are collected from a single person. This is done using a special machine that separates the platelets from the blood. The machine is called an apheresis machine. Platelets collected in this   way are called apheresed platelets. Apheresed platelets reduce the risk of becoming sensitive to the platelets. This lowers the chances of having a transfusion reaction. °· As it only takes a short time to give the platelets, this treatment can be given in an outpatient department. Platelets can also be  given before or after other treatments. °SEEK IMMEDIATE MEDICAL CARE IF: °You have any of the following symptoms over the next 12 hours or several days: °· Shaking chills. °· Fever with a temperature greater than 102°F (38.9°C) develops. °· Back pain or muscle pain. °· People around you feel you are not acting correctly, or you are confused. °· Blood in the urine or bowel movements, or bleeding from any place in your body. °· Shortness of breath, or difficulty breathing. °· Dizziness. °· Fainting. °· You break out in a rash or develop hives. °· Decrease in the amount of urine you are putting out, or the urine turns a dark color or changes to pink, red, or brown. °· A severe headache or stiff neck. °· Bruising more easily. °Document Released: 10/08/2007 Document Revised: 04/27/2014 Document Reviewed: 10/08/2007 °[employer]® Patient Information ©2015 [employer]. This information is not intended to replace advice given to you by your health care provider. Make sure you discuss any questions you have with your health care provider. ° °

## 2015-05-05 ENCOUNTER — Other Ambulatory Visit: Payer: Self-pay | Admitting: Hematology and Oncology

## 2015-05-05 DIAGNOSIS — R740 Nonspecific elevation of levels of transaminase and lactic acid dehydrogenase [LDH]: Principal | ICD-10-CM

## 2015-05-05 DIAGNOSIS — R7401 Elevation of levels of liver transaminase levels: Secondary | ICD-10-CM

## 2015-05-05 LAB — PREPARE PLATELET PHERESIS: UNIT DIVISION: 0

## 2015-05-06 ENCOUNTER — Telehealth: Payer: Self-pay | Admitting: *Deleted

## 2015-05-06 NOTE — Telephone Encounter (Signed)
-----   Message from Artis DelayNi Gorsuch, MD sent at 05/05/2015  5:05 PM EDT ----- Regarding: abnormal LFT Please get interpreter to explain to the patient that his liver function tests went up with increased dose of cellcept Please tell him to reduce cellcept to 500 mg morning and 250 mg evening. I will also refer him to GI consult

## 2015-05-06 NOTE — Telephone Encounter (Signed)
Instructed pt on Cellcept dose decrease and note from Dr. Bertis RuddyGorsuch below.  Confirmed pt's appts tomorrow.  Informed him of GI referral and to expect call from GI office for appt..  Pt verbalized understanding.  Used Spanish interpreter via [employer] to communicate.

## 2015-05-07 ENCOUNTER — Ambulatory Visit (HOSPITAL_BASED_OUTPATIENT_CLINIC_OR_DEPARTMENT_OTHER): Payer: Self-pay

## 2015-05-07 ENCOUNTER — Ambulatory Visit: Payer: Self-pay

## 2015-05-07 ENCOUNTER — Other Ambulatory Visit (HOSPITAL_BASED_OUTPATIENT_CLINIC_OR_DEPARTMENT_OTHER): Payer: Self-pay

## 2015-05-07 VITALS — BP 131/76 | HR 72 | Temp 98.6°F

## 2015-05-07 DIAGNOSIS — D696 Thrombocytopenia, unspecified: Secondary | ICD-10-CM

## 2015-05-07 DIAGNOSIS — D693 Immune thrombocytopenic purpura: Secondary | ICD-10-CM

## 2015-05-07 LAB — COMPREHENSIVE METABOLIC PANEL (CC13)
ALT: 237 U/L — ABNORMAL HIGH (ref 0–55)
ANION GAP: 10 meq/L (ref 3–11)
AST: 52 U/L — ABNORMAL HIGH (ref 5–34)
Albumin: 4.1 g/dL (ref 3.5–5.0)
Alkaline Phosphatase: 88 U/L (ref 40–150)
BUN: 20.3 mg/dL (ref 7.0–26.0)
CALCIUM: 9.2 mg/dL (ref 8.4–10.4)
CO2: 24 mEq/L (ref 22–29)
CREATININE: 1.1 mg/dL (ref 0.7–1.3)
Chloride: 104 mEq/L (ref 98–109)
GLUCOSE: 139 mg/dL (ref 70–140)
POTASSIUM: 4.6 meq/L (ref 3.5–5.1)
Sodium: 138 mEq/L (ref 136–145)
TOTAL PROTEIN: 7.9 g/dL (ref 6.4–8.3)
Total Bilirubin: 0.56 mg/dL (ref 0.20–1.20)

## 2015-05-07 LAB — CBC WITH DIFFERENTIAL/PLATELET
BASO%: 0.4 % (ref 0.0–2.0)
Basophils Absolute: 0.1 10*3/uL (ref 0.0–0.1)
EOS ABS: 0 10*3/uL (ref 0.0–0.5)
EOS%: 0.1 % (ref 0.0–7.0)
HEMATOCRIT: 47 % (ref 38.4–49.9)
HGB: 15.1 g/dL (ref 13.0–17.1)
LYMPH#: 1 10*3/uL (ref 0.9–3.3)
LYMPH%: 4.9 % — AB (ref 14.0–49.0)
MCH: 26.9 pg — AB (ref 27.2–33.4)
MCHC: 32.2 g/dL (ref 32.0–36.0)
MCV: 83.7 fL (ref 79.3–98.0)
MONO#: 0.3 10*3/uL (ref 0.1–0.9)
MONO%: 1.7 % (ref 0.0–14.0)
NEUT#: 18.4 10*3/uL — ABNORMAL HIGH (ref 1.5–6.5)
NEUT%: 92.9 % — ABNORMAL HIGH (ref 39.0–75.0)
Platelets: 48 10*3/uL — ABNORMAL LOW (ref 140–400)
RBC: 5.61 10*6/uL (ref 4.20–5.82)
RDW: 13.3 % (ref 11.0–14.6)
WBC: 19.8 10*3/uL — ABNORMAL HIGH (ref 4.0–10.3)

## 2015-05-07 LAB — HOLD TUBE, BLOOD BANK

## 2015-05-07 MED ORDER — ROMIPLOSTIM 250 MCG ~~LOC~~ SOLR
2.0000 ug/kg | SUBCUTANEOUS | Status: DC
  Administered 2015-05-07: 125 ug via SUBCUTANEOUS
  Filled 2015-05-07: qty 0.25

## 2015-05-07 NOTE — Patient Instructions (Signed)
Romiplostim injection Qu es este medicamento? El ROMIPLOSTIM ayuda a que su cuerpo produzca ms plaquetas. Este medicamento se [place] Islandsutiliza para tratar el nivel bajo de plaquetas causado por prpura trombocitopnica inmunitaria crnica (PTI). Este medicamento puede ser utilizado para otros usos; si tiene alguna pregunta consulte con su proveedor de atencin mdica o con su farmacutico. MARCAS COMERCIALES DISPONIBLES: Nplate Qu le debo informar a mi profesional de la salud antes de tomar este medicamento? Necesita saber si usted presenta alguno de los siguientes problemas o situaciones: -si tiene cncer o sndrome mielodisplsico -conteos sanguneos bajos, como baja cantidad de glbulos blancos, glbulos rojos y plaquetas -toma medicamentos que tratan o previenen cogulos sanguneos -una reaccin alrgica o inusual al romiplostim, al manitol, a otros medicamentos, alimentos, colorantes o conservantes -si est embarazada o buscando quedar embarazada -si est amamantando a un beb Cmo debo utilizar este medicamento? Este medicamento se administra mediante inyeccin debajo de la piel. Lo administra un profesional de Radiographer, therapeuticla salud en un hospital o en un entorno clnico. Su farmacutico le dar una Gua del medicamento especial con cada receta y relleno. Asegrese de leer esta informacin cada vez cuidadosamente. Hable con su pediatra para informarse acerca del uso de este medicamento en nios. Puede requerir atencin especial. Sobredosis: Pngase en contacto inmediatamente con un centro toxicolgico o una sala de urgencia si usted cree que haya tomado demasiado medicamento. ATENCIN: Reynolds AmericanEste medicamento es solo para usted. No comparta este medicamento con nadie. Qu sucede si me olvido de una dosis? Es importante no olvidar ninguna dosis. Informe a su mdico o a su profesional de la salud si no puede asistir a Marketing executiveuna cita. Qu puede interactuar con este medicamento? No se esperan interacciones. Puede ser  que esta lista no menciona todas las posibles interacciones. Informe a su profesional de Beazer Homesla salud de [employer] Inctodos los productos a base de hierbas, medicamentos de [place]venta libre o suplementos nutritivos que est tomando. Si usted fuma, consume bebidas alcohlicas o si utiliza drogas ilegales, indqueselo tambin a su profesional de Beazer Homesla salud. Algunas sustancias pueden interactuar con su medicamento. A qu debo estar atento al usar PPL Corporationeste medicamento? Se supervisar su estado de salud atentamente mientras reciba este medicamento. Visite a su profesional que lo receta o a su profesional de la salud para International aid/development workerchequear su evolucin peridicamente y para International aid/development workerrealizarse a los anlisis de sangre necesarios. Es importante asistir a todas sus citas. Qu efectos secundarios puedo tener al Boston Scientificutilizar este medicamento? Efectos secundarios que debe informar a su mdico o a Producer, television/film/videosu profesional de la salud tan pronto como sea posible: -Therapist, artreacciones alrgicas como erupcin cutnea, picazn o urticarias, hinchazn de la cara, labios o lengua -falta de aliento, dolor en el pecho, hinchazn de la pierna -sangrado, magulladuras inusuales Efectos secundarios que, por lo general, no requieren Psychologist, prison and probation servicesatencin mdica (debe informarlos a su mdico o a Producer, television/film/videosu profesional de la salud si persisten o si son molestos): -mareos -dolor de Training and development officercabeza -dolores musculares -dolor de los brazos y las piernas -Theme park managerdolor estomacal -dificultad para Financial traderconciliar el sueo Puede ser que esta lista no menciona todos los posibles efectos secundarios. Comunquese a su mdico por asesoramiento mdico Hewlett-Packardsobre los efectos secundarios. Usted puede informar los efectos secundarios a la FDA por telfono al 1-800-FDA-1088. Dnde debo guardar mi medicina? Este medicamento se administra en hospitales o clnicas y no necesitar guardarlo en su domicilio. ATENCIN: Este folleto es un resumen. Puede ser que no cubra toda la posible informacin. Si usted tiene preguntas acerca de esta medicina, consulte  con su mdico, su  farmacutico o su profesional de Beazer Homesla salud.  2015, Elsevier/Gold Standard. (2007-12-30 13:37:00)

## 2015-05-07 NOTE — Progress Notes (Signed)
Patient doesn't require a platelet transfusion per Dr. Bertis RuddyGorsuch. However, he will receive NPLATE today as scheduled. Patient taking cellcept 500 mg in the morning and 250 mg in the evening per Dr. Bertis RuddyGorsuch. Patient instructed to call our office with any further questions or concerns via interpreter.

## 2015-05-10 ENCOUNTER — Other Ambulatory Visit: Payer: Self-pay | Admitting: *Deleted

## 2015-05-11 ENCOUNTER — Other Ambulatory Visit (HOSPITAL_BASED_OUTPATIENT_CLINIC_OR_DEPARTMENT_OTHER): Payer: Self-pay

## 2015-05-11 ENCOUNTER — Telehealth: Payer: Self-pay | Admitting: Hematology and Oncology

## 2015-05-11 ENCOUNTER — Other Ambulatory Visit: Payer: Self-pay

## 2015-05-11 ENCOUNTER — Ambulatory Visit (HOSPITAL_BASED_OUTPATIENT_CLINIC_OR_DEPARTMENT_OTHER): Payer: Self-pay | Admitting: Hematology and Oncology

## 2015-05-11 ENCOUNTER — Encounter: Payer: Self-pay | Admitting: Hematology and Oncology

## 2015-05-11 VITALS — BP 126/66 | HR 76 | Temp 98.0°F | Resp 18 | Ht 62.0 in | Wt 142.6 lb

## 2015-05-11 DIAGNOSIS — R740 Nonspecific elevation of levels of transaminase and lactic acid dehydrogenase [LDH]: Secondary | ICD-10-CM

## 2015-05-11 DIAGNOSIS — R7401 Elevation of levels of liver transaminase levels: Secondary | ICD-10-CM

## 2015-05-11 DIAGNOSIS — D693 Immune thrombocytopenic purpura: Secondary | ICD-10-CM

## 2015-05-11 DIAGNOSIS — D72829 Elevated white blood cell count, unspecified: Secondary | ICD-10-CM

## 2015-05-11 DIAGNOSIS — D696 Thrombocytopenia, unspecified: Secondary | ICD-10-CM

## 2015-05-11 LAB — CBC WITH DIFFERENTIAL/PLATELET
BASO%: 0.3 % (ref 0.0–2.0)
Basophils Absolute: 0 10*3/uL (ref 0.0–0.1)
EOS%: 1.3 % (ref 0.0–7.0)
Eosinophils Absolute: 0.2 10*3/uL (ref 0.0–0.5)
HCT: 44.9 % (ref 38.4–49.9)
HEMOGLOBIN: 14.5 g/dL (ref 13.0–17.1)
LYMPH#: 3 10*3/uL (ref 0.9–3.3)
LYMPH%: 21.1 % (ref 14.0–49.0)
MCH: 26.6 pg — AB (ref 27.2–33.4)
MCHC: 32.2 g/dL (ref 32.0–36.0)
MCV: 82.6 fL (ref 79.3–98.0)
MONO#: 0.9 10*3/uL (ref 0.1–0.9)
MONO%: 6.7 % (ref 0.0–14.0)
NEUT%: 70.6 % (ref 39.0–75.0)
NEUTROS ABS: 10 10*3/uL — AB (ref 1.5–6.5)
Platelets: 33 10*3/uL — ABNORMAL LOW (ref 140–400)
RBC: 5.43 10*6/uL (ref 4.20–5.82)
RDW: 13.3 % (ref 11.0–14.6)
WBC: 14.2 10*3/uL — ABNORMAL HIGH (ref 4.0–10.3)

## 2015-05-11 LAB — COMPREHENSIVE METABOLIC PANEL (CC13)
ALT: 170 U/L — AB (ref 0–55)
ANION GAP: 12 meq/L — AB (ref 3–11)
AST: 37 U/L — ABNORMAL HIGH (ref 5–34)
Albumin: 4 g/dL (ref 3.5–5.0)
Alkaline Phosphatase: 75 U/L (ref 40–150)
BILIRUBIN TOTAL: 0.49 mg/dL (ref 0.20–1.20)
BUN: 15.8 mg/dL (ref 7.0–26.0)
CALCIUM: 9.6 mg/dL (ref 8.4–10.4)
CO2: 27 mEq/L (ref 22–29)
Chloride: 102 mEq/L (ref 98–109)
Creatinine: 0.8 mg/dL (ref 0.7–1.3)
GLUCOSE: 94 mg/dL (ref 70–140)
POTASSIUM: 4.1 meq/L (ref 3.5–5.1)
SODIUM: 140 meq/L (ref 136–145)
TOTAL PROTEIN: 7.4 g/dL (ref 6.4–8.3)

## 2015-05-11 NOTE — Assessment & Plan Note (Signed)
This is intermittent in nature. Ultrasound and MRI of the abdomen excluded cancer. It could be exacerbated by CellCept therapy. I will continue to monitor liver function tests closely. I'm referring him for GI consult for further evaluation

## 2015-05-11 NOTE — Assessment & Plan Note (Signed)
This is due to prednisone therapy. Observe. 

## 2015-05-11 NOTE — Telephone Encounter (Signed)
Added appt per pof..the patient ok and aware °

## 2015-05-11 NOTE — Progress Notes (Signed)
[facility] OFFICE PROGRESS NOTE  No PCP Per Patient SUMMARY OF HEMATOLOGIC HISTORY:  John Brennan 29 y.o. was recently diagnosed with severe ITP. He was recently hospitalized for similar reason of in April 2016, twice, and had received IVIG and corticosteroid therapy. Please see my initial consult note dated 03/26/2015 for further details. The patient was started on IVIG, dexamethasone, Solu-Medrol and prednisone. He is consider steroid refractory. On 04/23/2015, he is started on CellCept 250 mg twice a day From 04/30/2015 to 04/28/2015, he was readmitted to the hospital due to bleeding. Bone marrow aspirate and biopsy performed on 04/27/2015 confirmed diagnosis of ITP. He was started also in addition to all the above, Nplate injection weekly INTERVAL HISTORY: John Brennan 29 y.o. male returns for further follow-up. He feels well. The patient denies any recent signs or symptoms of bleeding such as spontaneous epistaxis, hematuria or hematochezia.  I have reviewed the past medical history, past surgical history, social history and family history with the patient and they are unchanged from previous note.  ALLERGIES:  has No Known Allergies.  MEDICATIONS:  Current Outpatient Prescriptions  Medication Sig Dispense Refill  . acetaminophen (TYLENOL) 325 MG tablet Take 2 tablets (650 mg total) by mouth every 6 (six) hours as needed for mild pain, fever or headache (or Fever >/= 101).    . mycophenolate (CELLCEPT) 250 MG capsule Take 2 capsules (500 mg total) by mouth 2 (two) times daily. (Patient taking differently: Take 500 mg by mouth 2 (two) times daily. Two in am and one in pm.) 60 capsule 3  . pantoprazole (PROTONIX) 40 MG tablet Take 1 tablet (40 mg total) by mouth daily. 30 tablet 0  . predniSONE (DELTASONE) 20 MG tablet Take 3 tablets (60 mg total) by mouth daily with breakfast. 90 tablet 0   No current facility-administered medications for this visit.     REVIEW  OF SYSTEMS:   Constitutional: Denies fevers, chills or night sweats Eyes: Denies blurriness of vision Ears, nose, mouth, throat, and face: Denies mucositis or sore throat Respiratory: Denies cough, dyspnea or wheezes Cardiovascular: Denies palpitation, chest discomfort or lower extremity swelling Gastrointestinal:  Denies nausea, heartburn or change in bowel habits Skin: Denies abnormal skin rashes Lymphatics: Denies new lymphadenopathy or easy bruising Neurological:Denies numbness, tingling or new weaknesses Behavioral/Psych: Mood is stable, no new changes  All other systems were reviewed with the patient and are negative.  PHYSICAL EXAMINATION: ECOG PERFORMANCE STATUS: 0 - Asymptomatic  Filed Vitals:   05/11/15 0943  BP: 126/66  Pulse: 76  Temp: 98 F (36.7 C)  Resp: 18   Filed Weights   05/11/15 0943  Weight: 142 lb 9.6 oz (64.683 kg)    GENERAL:alert, no distress and comfortable SKIN: skin color, texture, turgor are normal, no rashes or significant lesions. Noted mild skin bruising EYES: normal, Conjunctiva are pink and non-injected, sclera clear Musculoskeletal:no cyanosis of digits and no clubbing  NEURO: alert & oriented x 3 with fluent speech, no focal motor/sensory deficits  LABORATORY DATA:  I have reviewed the data as listed Results for orders placed or performed in visit on 05/11/15 (from the past 48 hour(s))  CBC with Differential/Platelet     Status: Abnormal   Collection Time: 05/11/15  9:25 AM  Result Value Ref Range   WBC 14.2 (H) 4.0 - 10.3 10e3/uL   NEUT# 10.0 (H) 1.5 - 6.5 10e3/uL   HGB 14.5 13.0 - 17.1 g/dL   HCT 44.9 38.4 - 49.9 %  Platelets 33 (L) 140 - 400 10e3/uL   MCV 82.6 79.3 - 98.0 fL   MCH 26.6 (L) 27.2 - 33.4 pg   MCHC 32.2 32.0 - 36.0 g/dL   RBC 5.43 4.20 - 5.82 10e6/uL   RDW 13.3 11.0 - 14.6 %   lymph# 3.0 0.9 - 3.3 10e3/uL   MONO# 0.9 0.1 - 0.9 10e3/uL   Eosinophils Absolute 0.2 0.0 - 0.5 10e3/uL   Basophils Absolute 0.0 0.0 -  0.1 10e3/uL   NEUT% 70.6 39.0 - 75.0 %   LYMPH% 21.1 14.0 - 49.0 %   MONO% 6.7 0.0 - 14.0 %   EOS% 1.3 0.0 - 7.0 %   BASO% 0.3 0.0 - 2.0 %  Comprehensive metabolic panel     Status: Abnormal   Collection Time: 05/11/15  9:26 AM  Result Value Ref Range   Sodium 140 136 - 145 mEq/L   Potassium 4.1 3.5 - 5.1 mEq/L   Chloride 102 98 - 109 mEq/L   CO2 27 22 - 29 mEq/L   Glucose 94 70 - 140 mg/dl   BUN 15.8 7.0 - 26.0 mg/dL   Creatinine 0.8 0.7 - 1.3 mg/dL   Total Bilirubin 0.49 0.20 - 1.20 mg/dL   Alkaline Phosphatase 75 40 - 150 U/L   AST 37 (H) 5 - 34 U/L   ALT 170 (H) 0 - 55 U/L   Total Protein 7.4 6.4 - 8.3 g/dL   Albumin 4.0 3.5 - 5.0 g/dL   Calcium 9.6 8.4 - 10.4 mg/dL   Anion Gap 12 (H) 3 - 11 mEq/L   EGFR >90 >90 ml/min/1.73 m2    Comment: eGFR is calculated using the CKD-EPI Creatinine Equation (2009)    Lab Results  Component Value Date   WBC 14.2* 05/11/2015   HGB 14.5 05/11/2015   HCT 44.9 05/11/2015   MCV 82.6 05/11/2015   PLT 33* 05/11/2015   ASSESSMENT & PLAN:  ITP (idiopathic thrombocytopenic purpura) Repeat bone marrow aspirate and biopsy confirmed diagnosis of ITP. I recommend we start prednisone taper. I have instructed him to [place] take only 20 mg of prednisone tomorrow He will continue on CellCept 500 mg in the morning and 250 mg in afternoon He will also come here on a weekly basis to get Nplate injection. He does not need platelet transfusion as long as his platelet count is greater than 10,000, unless he is bleeding He will return twice a week to get blood work monitoring and platelet transfusion as needed. I will see him back in 2 weeks.   Leukocytosis This is due to prednisone therapy. Observe.   Transaminasemia This is intermittent in nature. Ultrasound and MRI of the abdomen excluded cancer. It could be exacerbated by CellCept therapy. I will continue to monitor liver function tests closely. I'm referring him for GI consult for further  evaluation    All questions were answered. The patient knows to call the clinic with any problems, questions or concerns. No barriers to learning was detected. All the above is discussed with the help of an interpreter I spent 15 minutes counseling the patient face to face. The total time spent in the appointment was 20 minutes and more than 50% was on counseling.     Shaniqwa Horsman, MD 5/17/201610:17 AM

## 2015-05-11 NOTE — Assessment & Plan Note (Signed)
Repeat bone marrow aspirate and biopsy confirmed diagnosis of ITP. I recommend we start prednisone taper. I have instructed him to us take only 20 mg of prednisone tomorrow He will continue on CellCept 500 mg in the morning and 250 mg in afternoon He will also come here on a weekly basis to get Nplate injection. He does not need platelet transfusion as long as his platelet count is greater than 10,000, unless he is bleeding He will return twice a week to get blood work monitoring and platelet transfusion as needed. I will see him back in 2 weeks.

## 2015-05-14 ENCOUNTER — Other Ambulatory Visit (HOSPITAL_BASED_OUTPATIENT_CLINIC_OR_DEPARTMENT_OTHER): Payer: Self-pay

## 2015-05-14 ENCOUNTER — Ambulatory Visit (HOSPITAL_BASED_OUTPATIENT_CLINIC_OR_DEPARTMENT_OTHER): Payer: Self-pay

## 2015-05-14 VITALS — BP 132/78 | HR 77 | Temp 98.9°F

## 2015-05-14 DIAGNOSIS — D696 Thrombocytopenia, unspecified: Secondary | ICD-10-CM

## 2015-05-14 DIAGNOSIS — R740 Nonspecific elevation of levels of transaminase and lactic acid dehydrogenase [LDH]: Secondary | ICD-10-CM

## 2015-05-14 DIAGNOSIS — D693 Immune thrombocytopenic purpura: Secondary | ICD-10-CM

## 2015-05-14 DIAGNOSIS — D72829 Elevated white blood cell count, unspecified: Secondary | ICD-10-CM

## 2015-05-14 LAB — CBC WITH DIFFERENTIAL/PLATELET
BASO%: 0.2 % (ref 0.0–2.0)
Basophils Absolute: 0 10*3/uL (ref 0.0–0.1)
EOS ABS: 0 10*3/uL (ref 0.0–0.5)
EOS%: 0.2 % (ref 0.0–7.0)
HCT: 45.4 % (ref 38.4–49.9)
HGB: 15 g/dL (ref 13.0–17.1)
LYMPH#: 1.1 10*3/uL (ref 0.9–3.3)
LYMPH%: 8.1 % — ABNORMAL LOW (ref 14.0–49.0)
MCH: 27.6 pg (ref 27.2–33.4)
MCHC: 33 g/dL (ref 32.0–36.0)
MCV: 83.6 fL (ref 79.3–98.0)
MONO#: 0.4 10*3/uL (ref 0.1–0.9)
MONO%: 2.6 % (ref 0.0–14.0)
NEUT%: 88.9 % — ABNORMAL HIGH (ref 39.0–75.0)
NEUTROS ABS: 12.3 10*3/uL — AB (ref 1.5–6.5)
PLATELETS: 47 10*3/uL — AB (ref 140–400)
RBC: 5.43 10*6/uL (ref 4.20–5.82)
RDW: 12.9 % (ref 11.0–14.6)
WBC: 13.8 10*3/uL — AB (ref 4.0–10.3)

## 2015-05-14 LAB — COMPREHENSIVE METABOLIC PANEL (CC13)
ALK PHOS: 81 U/L (ref 40–150)
ALT: 127 U/L — ABNORMAL HIGH (ref 0–55)
AST: 50 U/L — AB (ref 5–34)
Albumin: 4.1 g/dL (ref 3.5–5.0)
Anion Gap: 13 mEq/L — ABNORMAL HIGH (ref 3–11)
BILIRUBIN TOTAL: 0.5 mg/dL (ref 0.20–1.20)
BUN: 15.4 mg/dL (ref 7.0–26.0)
CO2: 21 mEq/L — ABNORMAL LOW (ref 22–29)
CREATININE: 1.1 mg/dL (ref 0.7–1.3)
Calcium: 8.9 mg/dL (ref 8.4–10.4)
Chloride: 105 mEq/L (ref 98–109)
EGFR: 88 mL/min/{1.73_m2} — ABNORMAL LOW (ref >90)
Glucose: 146 mg/dl — ABNORMAL HIGH (ref 70–140)
Potassium: 4.5 mEq/L (ref 3.5–5.1)
SODIUM: 139 meq/L (ref 136–145)
Total Protein: 7.6 g/dL (ref 6.4–8.3)

## 2015-05-14 MED ORDER — MYCOPHENOLATE MOFETIL 250 MG PO CAPS: ORAL_CAPSULE | ORAL | Status: DC

## 2015-05-14 MED ORDER — ROMIPLOSTIM 250 MCG ~~LOC~~ SOLR
2.0000 ug/kg | SUBCUTANEOUS | Status: DC
  Administered 2015-05-14: 130 ug via SUBCUTANEOUS
  Filled 2015-05-14: qty 0.26

## 2015-05-14 NOTE — Progress Notes (Signed)
Mr John Brennan here for N-plate today   Explained to him to decrease Prednisone dose to 10 mg daily.  He was given instructions through the interpreter.

## 2015-05-18 ENCOUNTER — Encounter (HOSPITAL_COMMUNITY): Payer: Self-pay

## 2015-05-18 ENCOUNTER — Other Ambulatory Visit (HOSPITAL_BASED_OUTPATIENT_CLINIC_OR_DEPARTMENT_OTHER): Payer: Self-pay

## 2015-05-18 ENCOUNTER — Telehealth: Payer: Self-pay | Admitting: *Deleted

## 2015-05-18 DIAGNOSIS — D693 Immune thrombocytopenic purpura: Secondary | ICD-10-CM

## 2015-05-18 DIAGNOSIS — D696 Thrombocytopenia, unspecified: Secondary | ICD-10-CM

## 2015-05-18 DIAGNOSIS — R740 Nonspecific elevation of levels of transaminase and lactic acid dehydrogenase [LDH]: Secondary | ICD-10-CM

## 2015-05-18 DIAGNOSIS — D72829 Elevated white blood cell count, unspecified: Secondary | ICD-10-CM

## 2015-05-18 LAB — COMPREHENSIVE METABOLIC PANEL (CC13)
ALT: 126 U/L — ABNORMAL HIGH (ref 0–55)
AST: 58 U/L — ABNORMAL HIGH (ref 5–34)
Albumin: 4 g/dL (ref 3.5–5.0)
Alkaline Phosphatase: 79 U/L (ref 40–150)
Anion Gap: 11 mEq/L (ref 3–11)
BILIRUBIN TOTAL: 0.4 mg/dL (ref 0.20–1.20)
BUN: 14.3 mg/dL (ref 7.0–26.0)
CO2: 25 meq/L (ref 22–29)
Calcium: 8.8 mg/dL (ref 8.4–10.4)
Chloride: 105 mEq/L (ref 98–109)
Creatinine: 1 mg/dL (ref 0.7–1.3)
GLUCOSE: 120 mg/dL (ref 70–140)
Potassium: 4.2 mEq/L (ref 3.5–5.1)
Sodium: 141 mEq/L (ref 136–145)
Total Protein: 7.3 g/dL (ref 6.4–8.3)

## 2015-05-18 LAB — CBC WITH DIFFERENTIAL/PLATELET
BASO%: 0.4 % (ref 0.0–2.0)
BASOS ABS: 0 10*3/uL (ref 0.0–0.1)
EOS ABS: 0.1 10*3/uL (ref 0.0–0.5)
EOS%: 0.9 % (ref 0.0–7.0)
HCT: 44.4 % (ref 38.4–49.9)
HGB: 14.5 g/dL (ref 13.0–17.1)
LYMPH#: 1.4 10*3/uL (ref 0.9–3.3)
LYMPH%: 13 % — ABNORMAL LOW (ref 14.0–49.0)
MCH: 27 pg — ABNORMAL LOW (ref 27.2–33.4)
MCHC: 32.7 g/dL (ref 32.0–36.0)
MCV: 82.7 fL (ref 79.3–98.0)
MONO#: 0.5 10*3/uL (ref 0.1–0.9)
MONO%: 4.4 % (ref 0.0–14.0)
NEUT#: 9 10*3/uL — ABNORMAL HIGH (ref 1.5–6.5)
NEUT%: 81.3 % — ABNORMAL HIGH (ref 39.0–75.0)
Platelets: 35 10*3/uL — ABNORMAL LOW (ref 140–400)
RBC: 5.37 10*6/uL (ref 4.20–5.82)
RDW: 12.7 % (ref 11.0–14.6)
WBC: 11.1 10*3/uL — AB (ref 4.0–10.3)
nRBC: 0 % (ref 0–0)

## 2015-05-18 LAB — HOLD TUBE, BLOOD BANK

## 2015-05-18 NOTE — Telephone Encounter (Signed)
Platelets 35K today. Pt instructed to stay on prednisone 10mg  daily, continue cellcept same daily dose and Nplate on Friday. Verbalized understanding through interpreter

## 2015-05-18 NOTE — Telephone Encounter (Signed)
-----   Message from Ni Gorsuch, MD sent at 05/14/2015  1:47 PM EDT ----- Regarding: next week I'm not here next week  1) If repeat platelet next Tues/Fri is better than 47,000, tell him to reduce prednisone to 10 mg every other day but continue Cellcept same dose daily and Nplate on Fridays  2) If repeat platelet next Tues/Fri is worse than 47,000, tell him to stay on prednisone to 10 mg daily, continue Cellcept same dose daily and Nplate on Fridays  3) If repeat platelet next Tues/Fri is <20,000, tell him to stay on prednisone to 10 mg daily, continue Cellcept same dose daily and Nplate on Fridays AND transfuse him 1 unit of platelets  Thanks       

## 2015-05-21 ENCOUNTER — Ambulatory Visit (HOSPITAL_BASED_OUTPATIENT_CLINIC_OR_DEPARTMENT_OTHER): Payer: Self-pay

## 2015-05-21 ENCOUNTER — Telehealth: Payer: Self-pay | Admitting: *Deleted

## 2015-05-21 ENCOUNTER — Other Ambulatory Visit: Payer: Self-pay | Admitting: *Deleted

## 2015-05-21 ENCOUNTER — Other Ambulatory Visit (HOSPITAL_BASED_OUTPATIENT_CLINIC_OR_DEPARTMENT_OTHER): Payer: Self-pay

## 2015-05-21 VITALS — BP 136/73 | HR 97 | Temp 98.2°F

## 2015-05-21 DIAGNOSIS — D696 Thrombocytopenia, unspecified: Secondary | ICD-10-CM

## 2015-05-21 DIAGNOSIS — R740 Nonspecific elevation of levels of transaminase and lactic acid dehydrogenase [LDH]: Secondary | ICD-10-CM

## 2015-05-21 DIAGNOSIS — D693 Immune thrombocytopenic purpura: Secondary | ICD-10-CM

## 2015-05-21 DIAGNOSIS — D72829 Elevated white blood cell count, unspecified: Secondary | ICD-10-CM

## 2015-05-21 LAB — COMPREHENSIVE METABOLIC PANEL (CC13)
ALT: 149 U/L — ABNORMAL HIGH (ref 0–55)
AST: 68 U/L — AB (ref 5–34)
Albumin: 4.3 g/dL (ref 3.5–5.0)
Alkaline Phosphatase: 80 U/L (ref 40–150)
Anion Gap: 14 mEq/L — ABNORMAL HIGH (ref 3–11)
BUN: 11.6 mg/dL (ref 7.0–26.0)
CHLORIDE: 105 meq/L (ref 98–109)
CO2: 24 meq/L (ref 22–29)
Calcium: 9.3 mg/dL (ref 8.4–10.4)
Creatinine: 1 mg/dL (ref 0.7–1.3)
EGFR: >90 mL/min/{1.73_m2} (ref >90)
Glucose: 117 mg/dl (ref 70–140)
Potassium: 4.3 mEq/L (ref 3.5–5.1)
SODIUM: 142 meq/L (ref 136–145)
TOTAL PROTEIN: 7.5 g/dL (ref 6.4–8.3)
Total Bilirubin: 0.44 mg/dL (ref 0.20–1.20)

## 2015-05-21 LAB — CBC WITH DIFFERENTIAL/PLATELET
BASO%: 0.3 % (ref 0.0–2.0)
Basophils Absolute: 0 10*3/uL (ref 0.0–0.1)
EOS%: 0.8 % (ref 0.0–7.0)
Eosinophils Absolute: 0.1 10*3/uL (ref 0.0–0.5)
HCT: 44.4 % (ref 38.4–49.9)
HGB: 14.7 g/dL (ref 13.0–17.1)
LYMPH%: 13.7 % — AB (ref 14.0–49.0)
MCH: 27.3 pg (ref 27.2–33.4)
MCHC: 33.1 g/dL (ref 32.0–36.0)
MCV: 82.5 fL (ref 79.3–98.0)
MONO#: 0.4 10*3/uL (ref 0.1–0.9)
MONO%: 4.1 % (ref 0.0–14.0)
NEUT%: 81.1 % — ABNORMAL HIGH (ref 39.0–75.0)
NEUTROS ABS: 8.8 10*3/uL — AB (ref 1.5–6.5)
Platelets: 71 10*3/uL — ABNORMAL LOW (ref 140–400)
RBC: 5.38 10*6/uL (ref 4.20–5.82)
RDW: 12.7 % (ref 11.0–14.6)
WBC: 10.8 10*3/uL — AB (ref 4.0–10.3)
lymph#: 1.5 10*3/uL (ref 0.9–3.3)

## 2015-05-21 MED ORDER — ROMIPLOSTIM 250 MCG ~~LOC~~ SOLR
2.0000 ug/kg | SUBCUTANEOUS | Status: DC
  Administered 2015-05-21: 130 ug via SUBCUTANEOUS
  Filled 2015-05-21: qty 0.26

## 2015-05-21 NOTE — Telephone Encounter (Signed)
Platelet count today 71K. Pt instructed to reduce prednisone to 10 mg every other day, but continue Cellcept same dose daily and Nplate on Fridays. Informed via interpreter. Pt verbalized understanding

## 2015-05-21 NOTE — Telephone Encounter (Signed)
-----   Message from Artis DelayNi Gorsuch, MD sent at 05/14/2015  1:47 PM EDT ----- Regarding: next week I'm not here next week  1) If repeat platelet next Tues/Fri is better than 47,000, tell him to reduce prednisone to 10 mg every other day but continue Cellcept same dose daily and Nplate on Fridays  2) If repeat platelet next Tues/Fri is worse than 47,000, tell him to stay on prednisone to 10 mg daily, continue Cellcept same dose daily and Nplate on Fridays  3) If repeat platelet next Tues/Fri is <20,000, tell him to stay on prednisone to 10 mg daily, continue Cellcept same dose daily and Nplate on Fridays AND transfuse him 1 unit of platelets  Thanks

## 2015-05-25 ENCOUNTER — Ambulatory Visit: Payer: Self-pay

## 2015-05-25 ENCOUNTER — Other Ambulatory Visit: Payer: Self-pay | Admitting: *Deleted

## 2015-05-25 ENCOUNTER — Other Ambulatory Visit (HOSPITAL_BASED_OUTPATIENT_CLINIC_OR_DEPARTMENT_OTHER): Payer: Self-pay

## 2015-05-25 DIAGNOSIS — D693 Immune thrombocytopenic purpura: Secondary | ICD-10-CM

## 2015-05-25 DIAGNOSIS — D72829 Elevated white blood cell count, unspecified: Secondary | ICD-10-CM

## 2015-05-25 DIAGNOSIS — R740 Nonspecific elevation of levels of transaminase and lactic acid dehydrogenase [LDH]: Secondary | ICD-10-CM

## 2015-05-25 DIAGNOSIS — D696 Thrombocytopenia, unspecified: Secondary | ICD-10-CM

## 2015-05-25 LAB — CBC WITH DIFFERENTIAL/PLATELET
BASO%: 0.7 % (ref 0.0–2.0)
BASOS ABS: 0.1 10*3/uL (ref 0.0–0.1)
EOS ABS: 0.1 10*3/uL (ref 0.0–0.5)
EOS%: 0.6 % (ref 0.0–7.0)
HCT: 45.6 % (ref 38.4–49.9)
HEMOGLOBIN: 14.9 g/dL (ref 13.0–17.1)
LYMPH#: 1.1 10*3/uL (ref 0.9–3.3)
LYMPH%: 10.6 % — AB (ref 14.0–49.0)
MCH: 26.3 pg — ABNORMAL LOW (ref 27.2–33.4)
MCHC: 32.7 g/dL (ref 32.0–36.0)
MCV: 80.2 fL (ref 79.3–98.0)
MONO#: 0.2 10*3/uL (ref 0.1–0.9)
MONO%: 2 % (ref 0.0–14.0)
NEUT#: 8.6 10*3/uL — ABNORMAL HIGH (ref 1.5–6.5)
NEUT%: 86.1 % — AB (ref 39.0–75.0)
PLATELETS: 58 10*3/uL — AB (ref 140–400)
RBC: 5.68 10*6/uL (ref 4.20–5.82)
RDW: 13.1 % (ref 11.0–14.6)
WBC: 10 10*3/uL (ref 4.0–10.3)

## 2015-05-25 LAB — COMPREHENSIVE METABOLIC PANEL (CC13)
ALT: 104 U/L — ABNORMAL HIGH (ref 0–55)
AST: 39 U/L — ABNORMAL HIGH (ref 5–34)
Albumin: 4.5 g/dL (ref 3.5–5.0)
Alkaline Phosphatase: 78 U/L (ref 40–150)
Anion Gap: 10 mEq/L (ref 3–11)
BILIRUBIN TOTAL: 0.39 mg/dL (ref 0.20–1.20)
BUN: 14.2 mg/dL (ref 7.0–26.0)
CO2: 25 mEq/L (ref 22–29)
CREATININE: 0.8 mg/dL (ref 0.7–1.3)
Calcium: 9.7 mg/dL (ref 8.4–10.4)
Chloride: 104 mEq/L (ref 98–109)
GLUCOSE: 123 mg/dL (ref 70–140)
Potassium: 4.5 mEq/L (ref 3.5–5.1)
Sodium: 140 mEq/L (ref 136–145)
Total Protein: 7.9 g/dL (ref 6.4–8.3)

## 2015-05-25 NOTE — Progress Notes (Unsigned)
S/w pt in lobby w/ interpreter.  Instructed no need for transfusion today and to continue meds same dose per Dr. Bertis RuddyGorsuch.  Continue prednisone 10 mg every other day,  Continue Cellcept 500 mg in am and 250 mg in pm.  Return on Friday as scheduled for lab and Nplate injection.  Pt verbalized understanding and denies any bleeding.

## 2015-05-26 ENCOUNTER — Ambulatory Visit (HOSPITAL_COMMUNITY)
Admission: RE | Admit: 2015-05-26 | Discharge: 2015-05-26 | Disposition: A | Discharge status: Home / Self Care | Payer: Self-pay | Source: Ambulatory Visit | Attending: Hematology and Oncology | Admitting: Hematology and Oncology

## 2015-05-28 ENCOUNTER — Other Ambulatory Visit (HOSPITAL_BASED_OUTPATIENT_CLINIC_OR_DEPARTMENT_OTHER): Payer: Self-pay

## 2015-05-28 ENCOUNTER — Telehealth: Payer: Self-pay | Admitting: Hematology and Oncology

## 2015-05-28 ENCOUNTER — Ambulatory Visit (HOSPITAL_BASED_OUTPATIENT_CLINIC_OR_DEPARTMENT_OTHER): Payer: Self-pay | Admitting: Hematology and Oncology

## 2015-05-28 ENCOUNTER — Ambulatory Visit (HOSPITAL_BASED_OUTPATIENT_CLINIC_OR_DEPARTMENT_OTHER): Payer: Self-pay

## 2015-05-28 ENCOUNTER — Encounter: Payer: Self-pay | Admitting: Hematology and Oncology

## 2015-05-28 ENCOUNTER — Encounter: Payer: Self-pay | Admitting: Physician Assistant

## 2015-05-28 VITALS — BP 121/71 | HR 73 | Temp 97.7°F | Resp 18 | Ht 62.0 in | Wt 146.7 lb

## 2015-05-28 DIAGNOSIS — D696 Thrombocytopenia, unspecified: Secondary | ICD-10-CM

## 2015-05-28 DIAGNOSIS — D693 Immune thrombocytopenic purpura: Secondary | ICD-10-CM

## 2015-05-28 DIAGNOSIS — R740 Nonspecific elevation of levels of transaminase and lactic acid dehydrogenase [LDH]: Secondary | ICD-10-CM

## 2015-05-28 DIAGNOSIS — R7401 Elevation of levels of liver transaminase levels: Secondary | ICD-10-CM

## 2015-05-28 LAB — COMPREHENSIVE METABOLIC PANEL (CC13)
ALT: 73 U/L — ABNORMAL HIGH (ref 0–55)
AST: 31 U/L (ref 5–34)
Albumin: 4.1 g/dL (ref 3.5–5.0)
Alkaline Phosphatase: 70 U/L (ref 40–150)
Anion Gap: 9 mEq/L (ref 3–11)
BUN: 13.6 mg/dL (ref 7.0–26.0)
CALCIUM: 9.1 mg/dL (ref 8.4–10.4)
CO2: 27 mEq/L (ref 22–29)
Chloride: 106 mEq/L (ref 98–109)
Creatinine: 0.9 mg/dL (ref 0.7–1.3)
EGFR: >90 mL/min/{1.73_m2} (ref >90)
Glucose: 118 mg/dl (ref 70–140)
Potassium: 3.8 mEq/L (ref 3.5–5.1)
Sodium: 142 mEq/L (ref 136–145)
Total Bilirubin: 0.49 mg/dL (ref 0.20–1.20)
Total Protein: 7.2 g/dL (ref 6.4–8.3)

## 2015-05-28 LAB — CBC WITH DIFFERENTIAL/PLATELET
BASO%: 1 % (ref 0.0–2.0)
Basophils Absolute: 0.1 10*3/uL (ref 0.0–0.1)
EOS%: 4 % (ref 0.0–7.0)
Eosinophils Absolute: 0.3 10*3/uL (ref 0.0–0.5)
HCT: 43.1 % (ref 38.4–49.9)
HEMOGLOBIN: 14 g/dL (ref 13.0–17.1)
LYMPH#: 1.8 10*3/uL (ref 0.9–3.3)
LYMPH%: 22.6 % (ref 14.0–49.0)
MCH: 25.9 pg — ABNORMAL LOW (ref 27.2–33.4)
MCHC: 32.6 g/dL (ref 32.0–36.0)
MCV: 79.6 fL (ref 79.3–98.0)
MONO#: 0.5 10*3/uL (ref 0.1–0.9)
MONO%: 6.1 % (ref 0.0–14.0)
NEUT%: 66.3 % (ref 39.0–75.0)
NEUTROS ABS: 5.3 10*3/uL (ref 1.5–6.5)
PLATELETS: 59 10*3/uL — AB (ref 140–400)
RBC: 5.41 10*6/uL (ref 4.20–5.82)
RDW: 13.2 % (ref 11.0–14.6)
WBC: 8 10*3/uL (ref 4.0–10.3)

## 2015-05-28 MED ORDER — MYCOPHENOLATE MOFETIL 500 MG PO TABS: 500.0000 mg | ORAL_TABLET | Freq: Two times a day (BID) | ORAL | Status: DC

## 2015-05-28 MED ORDER — ROMIPLOSTIM 250 MCG ~~LOC~~ SOLR
130.0000 ug | SUBCUTANEOUS | Status: DC
  Administered 2015-05-28: 130 ug via SUBCUTANEOUS
  Filled 2015-05-28: qty 0.26

## 2015-05-28 NOTE — Progress Notes (Signed)
[facility] OFFICE PROGRESS NOTE  No PCP Per Patient SUMMARY OF HEMATOLOGIC HISTORY:  John Brennan 29 y.o. was recently diagnosed with severe ITP. He was recently hospitalized for similar reason of in April 2016, twice, and had received IVIG and corticosteroid therapy. Please see my initial consult note dated 03/26/2015 for further details. The patient was started on IVIG, dexamethasone, Solu-Medrol and prednisone. He is consider steroid refractory. On 04/23/2015, he is started on CellCept 250 mg twice a day From 04/30/2015 to 04/28/2015, he was readmitted to the hospital due to bleeding. Bone marrow aspirate and biopsy performed on 04/27/2015 confirmed diagnosis of ITP. He was started also in addition to all the above, Nplate injection weekly On 05/04/2015, he received his last platelet transfusion. Satting 05/07/2015, prednisone taper was initiated.  INTERVAL HISTORY: History is obtained through interpreter John Brennan 29 y.o. male returns for further follow-up. He feels well. He denies recent bruising. The patient denies any recent signs or symptoms of bleeding such as spontaneous epistaxis, hematuria or hematochezia.   I have reviewed the past medical history, past surgical history, social history and family history with the patient and they are unchanged from previous note.  ALLERGIES:  has No Known Allergies.  MEDICATIONS:  Current Outpatient Prescriptions  Medication Sig Dispense Refill  . acetaminophen (TYLENOL) 325 MG tablet Take 2 tablets (650 mg total) by mouth every 6 (six) hours as needed for mild pain, fever or headache (or Fever >/= 101).    . mycophenolate (CELLCEPT) 250 MG capsule Take 500 mg (2 capsules) in the morning and 250 mg (one capsule) in the evening. 90 capsule 1  . pantoprazole (PROTONIX) 40 MG tablet Take 1 tablet (40 mg total) by mouth daily. 30 tablet 0  . predniSONE (DELTASONE) 20 MG tablet Take 3 tablets (60 mg total) by mouth daily  with breakfast. 90 tablet 0  . mycophenolate (CELLCEPT) 500 MG tablet Take 1 tablet (500 mg total) by mouth 2 (two) times daily. 60 tablet 3   No current facility-administered medications for this visit.   Facility-Administered Medications Ordered in Other Visits  Medication Dose Route Frequency Provider Last Rate Last Dose  . romiPLOStim (NPLATE) injection 135 mcg  2 mcg/kg Subcutaneous Weekly Colston Pyle, MD         REVIEW OF SYSTEMS:   Constitutional: Denies fevers, chills or night sweats Eyes: Denies blurriness of vision Ears, nose, mouth, throat, and face: Denies mucositis or sore throat Respiratory: Denies cough, dyspnea or wheezes Cardiovascular: Denies palpitation, chest discomfort or lower extremity swelling Gastrointestinal:  Denies nausea, heartburn or change in bowel habits Skin: Denies abnormal skin rashes Lymphatics: Denies new lymphadenopathy or easy bruising Neurological:Denies numbness, tingling or new weaknesses Behavioral/Psych: Mood is stable, no new changes  All other systems were reviewed with the patient and are negative.  PHYSICAL EXAMINATION: ECOG PERFORMANCE STATUS: 0 - Asymptomatic  Filed Vitals:   05/28/15 1344  BP: 121/71  Pulse: 73  Temp: 97.7 F (36.5 C)  Resp: 18   Filed Weights   05/28/15 1344  Weight: 146 lb 11.2 oz (66.543 kg)    GENERAL:alert, no distress and comfortable SKIN: skin color, texture, turgor are normal, no rashes or significant lesions EYES: normal, Conjunctiva are pink and non-injected, sclera clear Musculoskeletal:no cyanosis of digits and no clubbing  NEURO: alert & oriented x 3 with fluent speech, no focal motor/sensory deficits  LABORATORY DATA:  I have reviewed the data as listed Results for orders placed or performed in visit  on 05/28/15 (from the past 48 hour(s))  CBC with Differential/Platelet     Status: Abnormal   Collection Time: 05/28/15  1:18 PM  Result Value Ref Range   WBC 8.0 4.0 - 10.3 10e3/uL    NEUT# 5.3 1.5 - 6.5 10e3/uL   HGB 14.0 13.0 - 17.1 g/dL   HCT 04.5 40.9 - 81.1 %   Platelets 59 (L) 140 - 400 10e3/uL   MCV 79.6 79.3 - 98.0 fL   MCH 25.9 (L) 27.2 - 33.4 pg   MCHC 32.6 32.0 - 36.0 g/dL   RBC 9.14 7.82 - 9.56 10e6/uL   RDW 13.2 11.0 - 14.6 %   lymph# 1.8 0.9 - 3.3 10e3/uL   MONO# 0.5 0.1 - 0.9 10e3/uL   Eosinophils Absolute 0.3 0.0 - 0.5 10e3/uL   Basophils Absolute 0.1 0.0 - 0.1 10e3/uL   NEUT% 66.3 39.0 - 75.0 %   LYMPH% 22.6 14.0 - 49.0 %   MONO% 6.1 0.0 - 14.0 %   EOS% 4.0 0.0 - 7.0 %   BASO% 1.0 0.0 - 2.0 %    Lab Results  Component Value Date   WBC 8.0 05/28/2015   HGB 14.0 05/28/2015   HCT 43.1 05/28/2015   MCV 79.6 05/28/2015   PLT 59* 05/28/2015    ASSESSMENT & PLAN:  ITP (idiopathic thrombocytopenic purpura) Repeat bone marrow aspirate and biopsy confirmed diagnosis of ITP. I recommend we continue prednisone taper and I have instructed him to discontinue prednisone on Monday, 05/31/2015 I recommended increase CellCept to 500 milligrams twice a day He will also come here on a weekly basis to get Nplate injection. He does not need platelet transfusion as long as his platelet count is greater than 10,000, unless he is bleeding I will change his follow-up to weekly blood work and injection and I plan to see him next month.   Transaminasemia This is intermittent in nature. Ultrasound and MRI of the abdomen excluded cancer. It could be exacerbated by CellCept therapy. I will continue to monitor liver function tests closely. I'm referring him for GI consult for further evaluation    All questions were answered. The patient knows to call the clinic with any problems, questions or concerns. No barriers to learning was detected.  I spent 15 minutes counseling the patient face to face. The total time spent in the appointment was 20 minutes and more than 50% was on counseling.     Bertis Ruddy, Renalda Locklin, MD 6/3/20162:16 PM

## 2015-05-28 NOTE — Assessment & Plan Note (Signed)
This is intermittent in nature. Ultrasound and MRI of the abdomen excluded cancer. It could be exacerbated by CellCept therapy. I will continue to monitor liver function tests closely. I'm referring him for GI consult for further evaluation

## 2015-05-28 NOTE — Telephone Encounter (Signed)
Pt confirmed labs/ov/inj per 06/03 POF, gave pt AVS and Calendar... KJ

## 2015-05-28 NOTE — Assessment & Plan Note (Signed)
Repeat bone marrow aspirate and biopsy confirmed diagnosis of ITP. I recommend we continue prednisone taper and I have instructed him to discontinue prednisone on Monday, 05/31/2015 I recommended increase CellCept to 500 milligrams twice a day He will also come here on a weekly basis to get Nplate injection. He does not need platelet transfusion as long as his platelet count is greater than 10,000, unless he is bleeding I will change his follow-up to weekly blood work and injection and I plan to see him next month.

## 2015-06-04 ENCOUNTER — Ambulatory Visit (HOSPITAL_BASED_OUTPATIENT_CLINIC_OR_DEPARTMENT_OTHER): Payer: Self-pay

## 2015-06-04 ENCOUNTER — Other Ambulatory Visit (HOSPITAL_BASED_OUTPATIENT_CLINIC_OR_DEPARTMENT_OTHER): Payer: Self-pay

## 2015-06-04 VITALS — BP 121/70 | HR 68 | Temp 97.8°F

## 2015-06-04 DIAGNOSIS — D696 Thrombocytopenia, unspecified: Secondary | ICD-10-CM

## 2015-06-04 DIAGNOSIS — D693 Immune thrombocytopenic purpura: Secondary | ICD-10-CM

## 2015-06-04 LAB — CBC WITH DIFFERENTIAL/PLATELET
BASO%: 1 % (ref 0.0–2.0)
Basophils Absolute: 0.1 10*3/uL (ref 0.0–0.1)
EOS%: 3.1 % (ref 0.0–7.0)
Eosinophils Absolute: 0.3 10*3/uL (ref 0.0–0.5)
HCT: 45.8 % (ref 38.4–49.9)
HGB: 14.9 g/dL (ref 13.0–17.1)
LYMPH#: 1.8 10*3/uL (ref 0.9–3.3)
LYMPH%: 22.6 % (ref 14.0–49.0)
MCH: 25.5 pg — AB (ref 27.2–33.4)
MCHC: 32.6 g/dL (ref 32.0–36.0)
MCV: 78.1 fL — ABNORMAL LOW (ref 79.3–98.0)
MONO#: 0.7 10*3/uL (ref 0.1–0.9)
MONO%: 9.1 % (ref 0.0–14.0)
NEUT%: 64.2 % (ref 39.0–75.0)
NEUTROS ABS: 5.2 10*3/uL (ref 1.5–6.5)
Platelets: 105 10*3/uL — ABNORMAL LOW (ref 140–400)
RBC: 5.86 10*6/uL — ABNORMAL HIGH (ref 4.20–5.82)
RDW: 13.3 % (ref 11.0–14.6)
WBC: 8.2 10*3/uL (ref 4.0–10.3)

## 2015-06-04 LAB — COMPREHENSIVE METABOLIC PANEL (CC13)
ALT: 57 U/L — ABNORMAL HIGH (ref 0–55)
AST: 30 U/L (ref 5–34)
Albumin: 4.5 g/dL (ref 3.5–5.0)
Alkaline Phosphatase: 69 U/L (ref 40–150)
Anion Gap: 11 mEq/L (ref 3–11)
BILIRUBIN TOTAL: 0.38 mg/dL (ref 0.20–1.20)
BUN: 15.3 mg/dL (ref 7.0–26.0)
CO2: 27 meq/L (ref 22–29)
Calcium: 9.8 mg/dL (ref 8.4–10.4)
Chloride: 104 mEq/L (ref 98–109)
Creatinine: 0.8 mg/dL (ref 0.7–1.3)
EGFR: >90 mL/min/{1.73_m2} (ref >90)
Glucose: 83 mg/dl (ref 70–140)
POTASSIUM: 4 meq/L (ref 3.5–5.1)
Sodium: 142 mEq/L (ref 136–145)
TOTAL PROTEIN: 7.7 g/dL (ref 6.4–8.3)

## 2015-06-04 MED ORDER — ROMIPLOSTIM 250 MCG ~~LOC~~ SOLR
130.0000 ug | SUBCUTANEOUS | Status: DC
  Administered 2015-06-04: 130 ug via SUBCUTANEOUS
  Filled 2015-06-04: qty 0.26

## 2015-06-09 ENCOUNTER — Other Ambulatory Visit: Payer: Self-pay | Admitting: *Deleted

## 2015-06-09 NOTE — Telephone Encounter (Signed)
TC from spanish interpreter, Raynelle Fanning, states pt asking for refill on Cellcept sent to [facility].   Called [facility] and they have new rx for 500 mg BID (it was sent on 6/3) and will fill for pt to pick up today.  Informed Raynelle Fanning and she will let pt know the medication is at [facility].  She will also make sure pt understands new dose is 500 mg twice daily,  Take one tablet twice a day.

## 2015-06-11 ENCOUNTER — Ambulatory Visit (HOSPITAL_BASED_OUTPATIENT_CLINIC_OR_DEPARTMENT_OTHER): Payer: Self-pay

## 2015-06-11 ENCOUNTER — Other Ambulatory Visit (HOSPITAL_BASED_OUTPATIENT_CLINIC_OR_DEPARTMENT_OTHER): Payer: Self-pay

## 2015-06-11 VITALS — BP 124/73 | HR 64 | Temp 97.9°F

## 2015-06-11 DIAGNOSIS — D696 Thrombocytopenia, unspecified: Secondary | ICD-10-CM

## 2015-06-11 DIAGNOSIS — D693 Immune thrombocytopenic purpura: Secondary | ICD-10-CM

## 2015-06-11 LAB — CBC WITH DIFFERENTIAL/PLATELET
BASO%: 1.1 % (ref 0.0–2.0)
Basophils Absolute: 0.1 10*3/uL (ref 0.0–0.1)
EOS%: 3.1 % (ref 0.0–7.0)
Eosinophils Absolute: 0.3 10*3/uL (ref 0.0–0.5)
HEMATOCRIT: 41.7 % (ref 38.4–49.9)
HEMOGLOBIN: 13.8 g/dL (ref 13.0–17.1)
LYMPH%: 20.6 % (ref 14.0–49.0)
MCH: 25.9 pg — ABNORMAL LOW (ref 27.2–33.4)
MCHC: 33 g/dL (ref 32.0–36.0)
MCV: 78.5 fL — ABNORMAL LOW (ref 79.3–98.0)
MONO#: 0.5 10*3/uL (ref 0.1–0.9)
MONO%: 6.5 % (ref 0.0–14.0)
NEUT#: 5.6 10*3/uL (ref 1.5–6.5)
NEUT%: 68.7 % (ref 39.0–75.0)
PLATELETS: 50 10*3/uL — AB (ref 140–400)
RBC: 5.31 10*6/uL (ref 4.20–5.82)
RDW: 13.5 % (ref 11.0–14.6)
WBC: 8.1 10*3/uL (ref 4.0–10.3)
lymph#: 1.7 10*3/uL (ref 0.9–3.3)

## 2015-06-11 LAB — COMPREHENSIVE METABOLIC PANEL (CC13)
ALT: 45 U/L (ref 0–55)
ANION GAP: 6 meq/L (ref 3–11)
AST: 30 U/L (ref 5–34)
Albumin: 4.1 g/dL (ref 3.5–5.0)
Alkaline Phosphatase: 61 U/L (ref 40–150)
BILIRUBIN TOTAL: 0.61 mg/dL (ref 0.20–1.20)
BUN: 10.7 mg/dL (ref 7.0–26.0)
CO2: 26 meq/L (ref 22–29)
CREATININE: 0.8 mg/dL (ref 0.7–1.3)
Calcium: 9.1 mg/dL (ref 8.4–10.4)
Chloride: 109 mEq/L (ref 98–109)
Glucose: 121 mg/dl (ref 70–140)
Potassium: 3.7 mEq/L (ref 3.5–5.1)
SODIUM: 141 meq/L (ref 136–145)
Total Protein: 7 g/dL (ref 6.4–8.3)

## 2015-06-11 MED ORDER — ROMIPLOSTIM 250 MCG ~~LOC~~ SOLR
2.0000 ug/kg | SUBCUTANEOUS | Status: DC
  Administered 2015-06-11: 135 ug via SUBCUTANEOUS
  Filled 2015-06-11: qty 0.27

## 2015-06-18 ENCOUNTER — Other Ambulatory Visit (HOSPITAL_BASED_OUTPATIENT_CLINIC_OR_DEPARTMENT_OTHER): Payer: Self-pay

## 2015-06-18 ENCOUNTER — Ambulatory Visit (HOSPITAL_BASED_OUTPATIENT_CLINIC_OR_DEPARTMENT_OTHER): Payer: Self-pay

## 2015-06-18 VITALS — BP 113/66 | HR 68 | Temp 98.1°F

## 2015-06-18 DIAGNOSIS — D693 Immune thrombocytopenic purpura: Secondary | ICD-10-CM

## 2015-06-18 DIAGNOSIS — D696 Thrombocytopenia, unspecified: Secondary | ICD-10-CM

## 2015-06-18 LAB — CBC WITH DIFFERENTIAL/PLATELET
BASO%: 0.7 % (ref 0.0–2.0)
Basophils Absolute: 0.1 10*3/uL (ref 0.0–0.1)
EOS%: 4.2 % (ref 0.0–7.0)
Eosinophils Absolute: 0.3 10*3/uL (ref 0.0–0.5)
HCT: 42.3 % (ref 38.4–49.9)
HGB: 14.1 g/dL (ref 13.0–17.1)
LYMPH%: 22 % (ref 14.0–49.0)
MCH: 26.5 pg — ABNORMAL LOW (ref 27.2–33.4)
MCHC: 33.3 g/dL (ref 32.0–36.0)
MCV: 79.5 fL (ref 79.3–98.0)
MONO#: 0.5 10*3/uL (ref 0.1–0.9)
MONO%: 6.9 % (ref 0.0–14.0)
NEUT%: 66.2 % (ref 39.0–75.0)
NEUTROS ABS: 5 10*3/uL (ref 1.5–6.5)
Platelets: 81 10*3/uL — ABNORMAL LOW (ref 140–400)
RBC: 5.32 10*6/uL (ref 4.20–5.82)
RDW: 13.4 % (ref 11.0–14.6)
WBC: 7.6 10*3/uL (ref 4.0–10.3)
lymph#: 1.7 10*3/uL (ref 0.9–3.3)

## 2015-06-18 LAB — COMPREHENSIVE METABOLIC PANEL (CC13)
ALT: 46 U/L (ref 0–55)
AST: 32 U/L (ref 5–34)
Albumin: 4.5 g/dL (ref 3.5–5.0)
Alkaline Phosphatase: 65 U/L (ref 40–150)
Anion Gap: 7 mEq/L (ref 3–11)
BUN: 12.7 mg/dL (ref 7.0–26.0)
CALCIUM: 9.5 mg/dL (ref 8.4–10.4)
CO2: 28 meq/L (ref 22–29)
Chloride: 106 mEq/L (ref 98–109)
Creatinine: 0.9 mg/dL (ref 0.7–1.3)
EGFR: >90 mL/min/{1.73_m2} (ref >90)
Glucose: 89 mg/dl (ref 70–140)
Potassium: 3.8 mEq/L (ref 3.5–5.1)
Sodium: 141 mEq/L (ref 136–145)
TOTAL PROTEIN: 7.3 g/dL (ref 6.4–8.3)
Total Bilirubin: 0.57 mg/dL (ref 0.20–1.20)

## 2015-06-18 MED ORDER — ROMIPLOSTIM 250 MCG ~~LOC~~ SOLR
135.0000 ug | SUBCUTANEOUS | Status: DC
  Administered 2015-06-18: 135 ug via SUBCUTANEOUS
  Filled 2015-06-18: qty 0.27

## 2015-06-21 ENCOUNTER — Telehealth: Payer: Self-pay | Admitting: *Deleted

## 2015-06-21 NOTE — Telephone Encounter (Signed)
Pt notified of message below. States he understands.  Message left on interpreter's phone Raynelle Fanning(Julie) to call us.

## 2015-06-21 NOTE — Telephone Encounter (Signed)
Pt instructed through interpreter to increase Cellcept to 750mg  twice a day

## 2015-06-21 NOTE — Telephone Encounter (Signed)
-----   Message from Artis Delay, MD sent at 06/21/2015  7:51 AM EDT ----- Regarding: cellcept Please get interpreter to tell him increase cellcept to 750 mg BID

## 2015-06-24 ENCOUNTER — Ambulatory Visit: Payer: Self-pay | Admitting: Physician Assistant

## 2015-06-25 ENCOUNTER — Other Ambulatory Visit: Payer: Self-pay | Admitting: *Deleted

## 2015-06-25 ENCOUNTER — Other Ambulatory Visit (HOSPITAL_BASED_OUTPATIENT_CLINIC_OR_DEPARTMENT_OTHER): Payer: Self-pay

## 2015-06-25 ENCOUNTER — Ambulatory Visit (HOSPITAL_BASED_OUTPATIENT_CLINIC_OR_DEPARTMENT_OTHER): Payer: Self-pay

## 2015-06-25 ENCOUNTER — Encounter: Payer: Self-pay | Admitting: *Deleted

## 2015-06-25 VITALS — BP 133/61 | HR 57 | Temp 98.3°F

## 2015-06-25 DIAGNOSIS — D696 Thrombocytopenia, unspecified: Secondary | ICD-10-CM

## 2015-06-25 DIAGNOSIS — D693 Immune thrombocytopenic purpura: Secondary | ICD-10-CM

## 2015-06-25 LAB — COMPREHENSIVE METABOLIC PANEL (CC13)
ALT: 39 U/L (ref 0–55)
AST: 26 U/L (ref 5–34)
Albumin: 4.3 g/dL (ref 3.5–5.0)
Alkaline Phosphatase: 62 U/L (ref 40–150)
Anion Gap: 9 mEq/L (ref 3–11)
BUN: 11.8 mg/dL (ref 7.0–26.0)
CALCIUM: 9.7 mg/dL (ref 8.4–10.4)
CHLORIDE: 106 meq/L (ref 98–109)
CO2: 26 mEq/L (ref 22–29)
Creatinine: 0.8 mg/dL (ref 0.7–1.3)
Glucose: 103 mg/dl (ref 70–140)
Potassium: 4.3 mEq/L (ref 3.5–5.1)
Sodium: 141 mEq/L (ref 136–145)
Total Bilirubin: 0.67 mg/dL (ref 0.20–1.20)
Total Protein: 7.1 g/dL (ref 6.4–8.3)

## 2015-06-25 LAB — CBC WITH DIFFERENTIAL/PLATELET
BASO%: 0.6 % (ref 0.0–2.0)
Basophils Absolute: 0.1 10*3/uL (ref 0.0–0.1)
EOS%: 4.5 % (ref 0.0–7.0)
Eosinophils Absolute: 0.4 10*3/uL (ref 0.0–0.5)
HEMATOCRIT: 44.4 % (ref 38.4–49.9)
HGB: 14.6 g/dL (ref 13.0–17.1)
LYMPH%: 23.1 % (ref 14.0–49.0)
MCH: 26.1 pg — AB (ref 27.2–33.4)
MCHC: 32.9 g/dL (ref 32.0–36.0)
MCV: 79.4 fL (ref 79.3–98.0)
MONO#: 0.6 10*3/uL (ref 0.1–0.9)
MONO%: 6.8 % (ref 0.0–14.0)
NEUT#: 5.4 10*3/uL (ref 1.5–6.5)
NEUT%: 65 % (ref 39.0–75.0)
NRBC: 0 % (ref 0–0)
PLATELETS: 43 10*3/uL — AB (ref 140–400)
RBC: 5.59 10*6/uL (ref 4.20–5.82)
RDW: 13.6 % (ref 11.0–14.6)
WBC: 8.2 10*3/uL (ref 4.0–10.3)
lymph#: 1.9 10*3/uL (ref 0.9–3.3)

## 2015-06-25 MED ORDER — ROMIPLOSTIM 250 MCG ~~LOC~~ SOLR
135.0000 ug | SUBCUTANEOUS | Status: DC
  Administered 2015-06-25: 135 ug via SUBCUTANEOUS
  Filled 2015-06-25: qty 0.27

## 2015-06-25 NOTE — Progress Notes (Signed)
This RN spoke with Oswaldo DoneHector, interpreter, and instructed patient to continue taking the same dose of Cellcept. Patient verbalized understanding.

## 2015-07-02 ENCOUNTER — Telehealth: Payer: Self-pay | Admitting: *Deleted

## 2015-07-02 ENCOUNTER — Other Ambulatory Visit (HOSPITAL_BASED_OUTPATIENT_CLINIC_OR_DEPARTMENT_OTHER): Payer: Self-pay

## 2015-07-02 ENCOUNTER — Ambulatory Visit (HOSPITAL_BASED_OUTPATIENT_CLINIC_OR_DEPARTMENT_OTHER): Payer: Self-pay

## 2015-07-02 VITALS — BP 129/69 | HR 60 | Temp 98.3°F

## 2015-07-02 DIAGNOSIS — D693 Immune thrombocytopenic purpura: Secondary | ICD-10-CM

## 2015-07-02 DIAGNOSIS — D696 Thrombocytopenia, unspecified: Secondary | ICD-10-CM

## 2015-07-02 LAB — COMPREHENSIVE METABOLIC PANEL (CC13)
ALBUMIN: 4.3 g/dL (ref 3.5–5.0)
ALT: 35 U/L (ref 0–55)
AST: 27 U/L (ref 5–34)
Alkaline Phosphatase: 59 U/L (ref 40–150)
Anion Gap: 8 mEq/L (ref 3–11)
BUN: 12.3 mg/dL (ref 7.0–26.0)
CO2: 24 mEq/L (ref 22–29)
Calcium: 9.5 mg/dL (ref 8.4–10.4)
Chloride: 108 mEq/L (ref 98–109)
Creatinine: 0.8 mg/dL (ref 0.7–1.3)
GLUCOSE: 92 mg/dL (ref 70–140)
POTASSIUM: 3.9 meq/L (ref 3.5–5.1)
Sodium: 140 mEq/L (ref 136–145)
TOTAL PROTEIN: 6.9 g/dL (ref 6.4–8.3)
Total Bilirubin: 0.69 mg/dL (ref 0.20–1.20)

## 2015-07-02 LAB — CBC WITH DIFFERENTIAL/PLATELET
BASO%: 0.7 % (ref 0.0–2.0)
BASOS ABS: 0.1 10*3/uL (ref 0.0–0.1)
EOS%: 3.5 % (ref 0.0–7.0)
Eosinophils Absolute: 0.3 10*3/uL (ref 0.0–0.5)
HEMATOCRIT: 43.4 % (ref 38.4–49.9)
HEMOGLOBIN: 14.3 g/dL (ref 13.0–17.1)
LYMPH#: 1.8 10*3/uL (ref 0.9–3.3)
LYMPH%: 23.6 % (ref 14.0–49.0)
MCH: 26 pg — ABNORMAL LOW (ref 27.2–33.4)
MCHC: 32.9 g/dL (ref 32.0–36.0)
MCV: 79.1 fL — ABNORMAL LOW (ref 79.3–98.0)
MONO#: 0.6 10*3/uL (ref 0.1–0.9)
MONO%: 7.8 % (ref 0.0–14.0)
NEUT%: 64.4 % (ref 39.0–75.0)
NEUTROS ABS: 4.8 10*3/uL (ref 1.5–6.5)
Platelets: 37 10*3/uL — ABNORMAL LOW (ref 140–400)
RBC: 5.49 10*6/uL (ref 4.20–5.82)
RDW: 13.7 % (ref 11.0–14.6)
WBC: 7.5 10*3/uL (ref 4.0–10.3)

## 2015-07-02 MED ORDER — ROMIPLOSTIM 250 MCG ~~LOC~~ SOLR
200.0000 ug | SUBCUTANEOUS | Status: DC
  Administered 2015-07-02: 200 ug via SUBCUTANEOUS
  Filled 2015-07-02: qty 0.4

## 2015-07-02 MED ORDER — MYCOPHENOLATE MOFETIL 500 MG PO TABS: 750.0000 mg | ORAL_TABLET | Freq: Two times a day (BID) | ORAL | Status: DC

## 2015-07-02 NOTE — Telephone Encounter (Signed)
-----   Message from Artis DelayNi Gorsuch, MD sent at 07/02/2015  1:23 PM EDT ----- Regarding: cellcept Continue same dose ----- Message -----    From: [facility]    Sent: 07/02/2015   1:14 PM      To: Artis DelayNi Gorsuch, MD

## 2015-07-08 ENCOUNTER — Ambulatory Visit (INDEPENDENT_AMBULATORY_CARE_PROVIDER_SITE_OTHER): Payer: Self-pay | Admitting: Physician Assistant

## 2015-07-08 ENCOUNTER — Encounter: Payer: Self-pay | Admitting: Physician Assistant

## 2015-07-08 VITALS — BP 122/70 | HR 88 | Ht 62.0 in | Wt 147.0 lb

## 2015-07-08 DIAGNOSIS — K760 Fatty (change of) liver, not elsewhere classified: Secondary | ICD-10-CM

## 2015-07-08 NOTE — Patient Instructions (Signed)
Do not drink Alcohol. Exercise You are scheduled to follow up in September.  Please come by the office 1 week prior to appointment to have blood drawn.   Dieta para el control del colesterol y las grasas  (Fat and Cholesterol Control Diet) Los niveles de grasa y colesterol en la sangre y en los rganos se ven influidos por la dieta. Los [employer] de grasa y Oncologist pueden conducir a enfermedades del Programmer, multimedia, de los pequeos y los grandes vasos sanguneos, de la vescula biliar, el hgado y el pncreas.  CONTROL DE LA GRASA Y EL COLESTEROL CON LA DIETA  Aunque el ejercicio y el estilo de vida son factores importantes, su dieta es la clave. Esto se debe a que se sabe que ciertos alimentos hacen subir el colesterol y otros lo [place]. El objetivo debe ser [employer] alimentos, de modo que tengan un efecto sobre el colesterol y, an ms importante, Microbiologist las grasas saturadas y trans con otros tipos de grasas, como las monoinsaturadas y las poliinsaturadas y cidos grasos omega-3.  En promedio, una persona no debe consumir ms de 15 a 17 g de grasas saturadas por [employer]. Las grasas saturadas y trans se consideran grasas "malas", ya que elevan el colesterol LDL. Las grasas saturadas se encuentran principalmente en productos animales como carne, [place] y crema. Sin embargo, eso no significa que tenga que renunciar a todas sus comidas favoritas. Actualmente, hay buenos sustitutos bajos en colesterol, bajos en grasas para la [employer] de las cosas que le gusta comer. Elija aquellos alimentos alternativos que sean bajos en grasas o sin grasas. Elija cortes de peceto o lomo de carne roja. Estos tipos de cortes contienen menos grasa y colesterol. Pollo (sin la piel), pescado, ternera y [place] molida son excelentes opciones. Eliminar las carnes grasas, como las salchichas y [place]. Los mariscos contienen poca o casi nada de grasas saturadas. Consuma una porcin de 3 oz (85 g) de carne magra, aves  o pescado.  Las grasas trans tambin se llaman "aceites parcialmente hidrogenados". Son aceites manipulados cientficamente de [place] que son slidos a Publishing rights manager, tienen una larga vida y Glass blower/designer sabor y la textura de los alimentos a los que se Scientist, clinical (histocompatibility and immunogenetics). Las grasas trans se encuentran en la [place], [place], crackers y alimentos horneados.  Al hornear y Water quality scientist, el aceite es un buen sustituto de la [place]. Los aceites monoinsaturados son beneficiosos, sobre todo porque se cree que reducen el colesterol LDL y aumentan el HDL. Los aceites que hay que evitar completamente son los aceites tropicales saturados, como el de coco y palma.  Recuerde consumir una gran cantidad de alimentos de los grupos que estn naturalmente libres de grasas saturadas y grasas trans, e incluya pescado, frutas, verduras, frijoles, granos (cebada, arroz, cuscs, trigo bulgur) y pastas (sin salsas de crema).  IDENTIFICACIN DE LOS ALIMENTOS QUE REDUCEN LAS GRASAS Y ELCOLESTEROL  La fibra soluble puede reducir el colesterol. Este tipo de [place] se Occupational psychologist en las frutas como [place], verduras como el [place], papas y DISH, las legumbres [employer] frijoles, guisantes y Therapist, occupational y granos como la cebada. Los alimentos enriquecidos con Public librarian (fitosteroles) tambin pueden reducir Print production planner. Consuma al menos 2 g por da de estos alimentos para un efecto de disminucin del colesterol.  Lea las etiquetas de los paquetes para identificar los alimentos bajos en grasas saturadas, en grasas trans y los bajos en grasas en el supermercado. Seleccione los [employer] tienen slo 2 a 3  g de grasa saturada por onza. Utilice margarina saludable para el corazn que sea Ken[place]beclibre de grasas trans o aceites parcialmente hidrogenados. Al comprar productos de panadera (galletas, crackers), se deben evitar los aceites parcialmente hidrogenados. Panes y panecillos deben hacerse con cereales integrales (trigo integral o harina de  avena integral en lugar de " harina " o " harina enriquecida ") Compre sopas en lata que no sean cremosas, con bajo contenido de sal y sin grasas adicionadas.  TCNICAS DE PREPARACIN DE LOS ALIMENTOS  Nunca prepare los alimentos fritos. Si usted debe frer, Erie Insurance Groupsaltee los alimentos en muy poca grasa o use un aerosol de cocina anti adherente. Siempre que sea posible, debe hervir, hornear o asar las carnes y preparar las verduras al vapor. En lugar de agregar mantequilla o margarina a las verduras, use limn y hierbas, pur de [place] y canela (para la calabaza y la batata). Utilice yogur natural sin grasa, salsas y aderezos bajos en grasa para ensaladas.  BAJO EN GRASAS SATURADAS / SUSTITUTOS BAJOS EN GRASA  [place] / grasas saturadas (g)  Evite: Bife, veteado (3 oz/85 g) / 11 g  Elija: Bife, magro(3 oz/85 g) / 4 g  Evite: Hamburguesa (3 oz/85 g) / 7 g  Elija: [place]uesa, magra (3 oz/85 g) / 5 g  Evite: Jamn (3 oz/85 g) / 6 g  Elija: Jamn, corte magro (3 oz/85 g) / 2,4 g  Evite: Pollo con piel, carne oscura (3 oz/85 g) / 4 g  Elija: Pollo, sin piel, carne oscura (3 oz/85 g) / 2 g  Evite: Pollo con piel, carne blanca (3 oz/85 g) / 2,5 g  Elija: Pollo, sin piel, carne blanca (3 oz/85 g) / 1 g Lcteos / Grasa saturada (g)   Evite: Leche entera (1 taza) / 5 g  Elija: Leche descremada, 2% (1 taza) / 3 g  Elija: Leche descremada, 1% (1 taza) / 1,5 g  Elija: Leche descremada, 1 taza (0,3 g).  Evite: Queso duro (1 oz/28 g) / 6 g  Elija: Queso de PPG Industriesleche descremada (1 oz/28 g) / 2 a 3 g  Evite: Queso cottage, 4% de grasa (1 taza) / 6,5 g  Elija: Queso cottage bajo en grasa, 1% de grasa (1 taza) / 1,5 g  Evite: Helado (1 taza) / 9 g  Elija: Sorbete (1 taza) / 2,5 g  Elija: Yogur congelado descremado (1 taza) / 0,3 g  Elija: Barra de frutas congeladas / trace  Evite: Crema batida (1 cucharada) / 3,5 g  Elija: Crema batida no lctea (1 cucharada) / 1 g Condimentos / Grasas  Saturadas (g)   Evite: Mayonesa (1 cucharada) / 2 g  Elija: Mayonesa baja en grasa (1 cucharada) / 1 g  Evite: Mantequilla (1 cucharada) / 7 g  Elija: Margarina light extra (1 cucharada) / 1 g  Evite: Aceite de coco (1 cucharada) / 11,8 g  Elija: Aceite de oliva (1 cucharada) / 1,8 g  Elija: Aceite de maz (1 cucharada) / 1,7 g  Elija: Aceite de crtamo (1 cucharada) / 1,2 g  Elija: Aceite de girasol (1 cucharada) / 1,4 g  Elija: Aceite de soja (1 cucharada) / 0 mg / 2,4 g  Elija: Aceite de canola (1 cucharada) / 0 mg / 1 g Document Released: 12/11/2005 Document Revised: 04/07/2013 ExitCare Patient Information 2015 [place]. This information is not intended to replace advice given to you by your health care provider. Make sure you discuss any questions you have with your health  care provider.  

## 2015-07-08 NOTE — Progress Notes (Signed)
Patient ID: John Brennan, male   DOB: Nov 09, 1986, 29 y.o.   MRN: [id number]    HPI:  John Brennan is a 29 y.o.   male referred by John Lark, MD for evaluation of elevated transaminases. Mr. Vise does not speak any English and is accompanied by an interpreter. He reports that he moved to the [place] from [place] approximately 9 years ago. He has had no health problems until April this year when he began to experience unusual bruising. He was seen in the walk-in clinic and found to have thrombocytopenia. He was admitted to the hospital and received IV Ig, dexamethasone, Solu-Medrol, and prednisone. He was considered sterile right refractory and on 04/23/2015 he was started on CellCept 250 mg twice daily. In early May he was readmitted to the hospital due to bleeding. Bone marrow aspirate and biopsy was performed on 04/27/2015 and confirmed the diagnosis of ITP. He has been followed as an outpatient by Dr. Alvy Bimler of hematology since then. He was noted to have elevated transaminases in April and in May that have since normalized. He had an abdominal ultrasound that revealed fatty liver and an MRI of the liver that showed heterogenous hepatic steatosis. Patient states he feels fine. His appetite has been good and his weight has been stable. He denies abdominal pain or jaundice. He has no prior history of liver disease. Hepatitis testing revealed he is negative for hepatitis A, B, and C. HIV testing was nonreactive as well. Patient denies a history of intravenous or intranasal drug use. He says his father died of alcoholic liver disease. The patient for years would have several beers one weekend per month but discontinued this practice approximately 5 months ago. He denies use of any herbal supplements. He has not had any new meds other than what he has been given by hematology.   Past Medical History  Diagnosis Date  . Thrombocytopenia     History reviewed. No pertinent past surgical  history. Family History  Problem Relation Age of Onset  . Diabetes Father    History  Substance Use Topics  . Smoking status: Never Smoker   . Smokeless tobacco: Never Used  . Alcohol Use: 1.8 oz/week    3 Cans of beer per week     Comment: occassional beer   Current Outpatient Prescriptions  Medication Sig Dispense Refill  . acetaminophen (TYLENOL) 325 MG tablet Take 2 tablets (650 mg total) by mouth every 6 (six) hours as needed for mild pain, fever or headache (or Fever >/= 101).    . mycophenolate (CELLCEPT) 500 MG tablet Take 1.5 tablets (750 mg total) by mouth 2 (two) times daily. 90 tablet 3   No current facility-administered medications for this visit.   No Known Allergies   Review of Systems: Gen: Denies any fever, chills, sweats, anorexia, fatigue, weakness, malaise, weight loss, and sleep disorder CV: Denies chest pain, angina, palpitations, syncope, orthopnea, PND, peripheral edema, and claudication. Resp: Denies dyspnea at rest, dyspnea with exercise, cough, sputum, wheezing, coughing up blood, and pleurisy. GI: Denies vomiting blood, jaundice, and fecal incontinence.   Denies dysphagia or odynophagia. GU : Denies urinary burning, blood in urine, urinary frequency, urinary hesitancy, nocturnal urination, and urinary incontinence. MS: Denies joint pain, limitation of movement, and swelling, stiffness, low back pain, extremity pain. Denies muscle weakness, cramps, atrophy.  Derm: Denies rash, itching, dry skin, hives, moles, warts, or unhealing ulcers.  Psych: Denies depression, anxiety, memory loss, suicidal ideation, hallucinations, paranoia, and confusion. Heme: Denies  bruising, bleeding, and enlarged lymph nodes. Neuro:  Denies any headaches, dizziness, paresthesias. Endo:  Denies any problems with DM, thyroid, adrenal function  Studies:     Show images for MR Abdomen W Wo Contrast     Study Result     CLINICAL DATA: Abdominal pain. Right hepatic lobe  hypoechoic mass. Thrombocytopenia.  EXAM: MRI ABDOMEN WITHOUT AND WITH CONTRAST  TECHNIQUE: Multiplanar multisequence MR imaging of the abdomen was performed both before and after the administration of intravenous contrast.  CONTRAST: 61m MULTIHANCE GADOBENATE DIMEGLUMINE 529 MG/ML IV SOLN  COMPARISON: Abdominal ultrasound of 03/26/2015.  FINDINGS: Lower chest: Normal heart size without pericardial or pleural effusion.  Hepatobiliary: Heterogeneous hepatic steatosis. Relative sparing in the posterior aspect of the right lobe, including on image 61 of series 7 likely accounted for the ultrasound abnormality. No suspicious liver lesion. Normal gallbladder, without biliary ductal dilatation.  Pancreas: Normal, without mass or ductal dilatation.  Spleen: Normal  Adrenals/Urinary Tract: Normal adrenal glands. Normal kidneys, without hydronephrosis.  Stomach/Bowel: Normal stomach and abdominal bowel loops.  Vascular/Lymphatic: Normal caliber of the aorta and branch vessels. No retroperitoneal or retrocrural adenopathy.  Other: No ascites.  Musculoskeletal: No acute osseous abnormality.  IMPRESSION: Heterogeneous hepatic steatosis. The ultrasound abnormality was likely secondary to sparing.  No acute abdominal process.   Electronically Signed  By: KAbigail MiyamotoM.D.  On: 03/28/2015 16:39   Abdomen Complete     Study Result     CLINICAL DATA: Generalized abdominal pain.  EXAM: ULTRASOUND ABDOMEN COMPLETE  COMPARISON: None.  FINDINGS: Gallbladder: No gallstones or wall thickening visualized. No sonographic Murphy sign noted.  Common bile duct: Diameter: 3 mm, within normal limits.  Liver: Diffusely increased echogenicity of the hepatic parenchyma, consistent with diffuse hepatic steatosis/hepatocellular disease. A poorly defined hypoechoic lesion is seen in posterior right hepatic lobe which measures 3.5 x 2.4 x 3.0 cm.  Differential diagnosis includes hepatic neoplasm and focal fatty sparing.  IVC: No abnormality visualized.  Pancreas: Visualized portion unremarkable.  Spleen: Size and appearance within normal limits.  Right Kidney: Length: 9.7 cm. Echogenicity within normal limits. No mass or hydronephrosis visualized.  Left Kidney: Length: 10.6 cm. Echogenicity within normal limits. No mass or hydronephrosis visualized.  Abdominal aorta: No aneurysm visualized.  Other findings: None.  IMPRESSION: No evidence of gallstones or biliary dilatation.  3.5 cm ill-defined hypoechoic lesion in the posterior right hepatic lobe. Differential diagnosis includes benign focal fatty sparing as well as hepatic neoplasm. Recommend nonemergent abdomen MRI without and with contrast for further characterization.  Diffuse hepatic steatosis/ hepatocellular disease. Consider nonemergent hepatic elastography ultrasound exam for non-invasive risk assessment for hepatic fibrosis/cirrhosis.   Electronically Signed  By: JEarle GellM.D.  On: 03/26/2015 20:45      LAB RESULTS: Chem profile 05/18/2015 alkaline phosphatase 79 albumen 4 AST 58 ALT 126 total bili 0.4 05/21/2015 alkaline phosphatase 80 albumen 4.3 AST 68 ALT 149 total bili 0.44 05/28/2015 alkaline phosphatase 70, albumen 4.1, AST 31, ALT 73, total bili 0.49 06/11/2015 alkaline phosphatase 61 albumen 4.1 AST 30 ALT 45 total bili 0.61. 06/18/2015 alkaline phosphatase 65 albumen 4.5 AST 32 ALT 46 total bili 0.57 06/25/2015 alkaline phosphatase 62 albumen 4.3 AST 26 ALT 39 total bili 0.67 07/02/2015 alkaline phosphatase 59 albumen 4.3 AST 27 ALT 35 total bili 0.69 03/26/2015 hepatitis A IgM nonreactive, hepatitis B surface antigen negative, hepatitis B C IgM nonreactive HCV antibody negative, HIV nonreactive.   Physical Exam: BP 122/70 mmHg  Pulse 88  Ht 5'  2" (1.575 m)  Wt 147 lb (66.679 kg)  BMI 26.88 kg/m2  SpO2  98% Constitutional: Pleasant,well-developed male in no acute distress. HEENT: Normocephalic and atraumatic. Conjunctivae are normal. No scleral icterus. Neck supple. No thyromegaly Cardiovascular: Normal rate, regular rhythm.  Pulmonary/chest: Effort normal and breath sounds normal. No wheezing, rales or rhonchi. Abdominal: Soft, nondistended, nontender. Bowel sounds active throughout. There are no masses palpable. No hepatomegaly. Extremities: no edema Lymphadenopathy: No cervical adenopathy noted. Neurological: Alert and oriented to person place and time. Skin: Skin is warm and dry. Facial acne Psychiatric: Normal mood and affect. Behavior is normal.  ASSESSMENT AND PLAN: 29 year old male with a recent diagnosis of ITP found to have an elevation of his transaminases referred for evaluation. Ultrasound and MRI revealed no malignancy. Patient is of Hispanic origin and has a predisposition to fatty liver. Patient has been instructed to abstain from alcohol and adhere to a low-fat diet. We will repeat his hepatic function panel in 3 months. Patient has been advised to receive Twinrix immunization for hepatitis A and hepatitis B, however he declines at this point has he does not have insurance. He reports that he has met with someone who is helping him obtain insurance. He says he would like to receive his immunizations at hematology if possible since he has scheduled follow-up there. He was instructed that if this is not possible he can call our office to schedule immunization. He has been given a 3 month follow-up at his request as he is concerned he may "fall through the cracks" with his language barrier. It was also explained to patient that sometimes his CellCept may cause transient elevation of his transaminases.    Makiyah Zentz, Vita Barley PA-C 07/08/2015, 11:32 AM  CC: John Lark, MD

## 2015-07-09 ENCOUNTER — Other Ambulatory Visit (HOSPITAL_BASED_OUTPATIENT_CLINIC_OR_DEPARTMENT_OTHER): Payer: Self-pay

## 2015-07-09 ENCOUNTER — Other Ambulatory Visit: Payer: Self-pay | Admitting: Hematology and Oncology

## 2015-07-09 ENCOUNTER — Ambulatory Visit (HOSPITAL_BASED_OUTPATIENT_CLINIC_OR_DEPARTMENT_OTHER): Payer: Self-pay

## 2015-07-09 ENCOUNTER — Telehealth: Payer: Self-pay | Admitting: Hematology and Oncology

## 2015-07-09 VITALS — BP 135/66 | HR 61 | Temp 98.3°F

## 2015-07-09 DIAGNOSIS — D693 Immune thrombocytopenic purpura: Secondary | ICD-10-CM

## 2015-07-09 DIAGNOSIS — D696 Thrombocytopenia, unspecified: Secondary | ICD-10-CM

## 2015-07-09 LAB — CBC WITH DIFFERENTIAL/PLATELET
BASO%: 1 % (ref 0.0–2.0)
BASOS ABS: 0.1 10*3/uL (ref 0.0–0.1)
EOS%: 4.3 % (ref 0.0–7.0)
Eosinophils Absolute: 0.3 10*3/uL (ref 0.0–0.5)
HCT: 44 % (ref 38.4–49.9)
HGB: 14.2 g/dL (ref 13.0–17.1)
LYMPH%: 20.2 % (ref 14.0–49.0)
MCH: 25.3 pg — ABNORMAL LOW (ref 27.2–33.4)
MCHC: 32.3 g/dL (ref 32.0–36.0)
MCV: 78.3 fL — ABNORMAL LOW (ref 79.3–98.0)
MONO#: 0.5 10*3/uL (ref 0.1–0.9)
MONO%: 6 % (ref 0.0–14.0)
NEUT#: 5.5 10*3/uL (ref 1.5–6.5)
NEUT%: 68.5 % (ref 39.0–75.0)
Platelets: 224 10*3/uL (ref 140–400)
RBC: 5.62 10*6/uL (ref 4.20–5.82)
RDW: 14.8 % — ABNORMAL HIGH (ref 11.0–14.6)
WBC: 8 10*3/uL (ref 4.0–10.3)
lymph#: 1.6 10*3/uL (ref 0.9–3.3)

## 2015-07-09 MED ORDER — ROMIPLOSTIM 250 MCG ~~LOC~~ SOLR
2.0000 ug/kg | SUBCUTANEOUS | Status: DC
  Administered 2015-07-09: 135 ug via SUBCUTANEOUS
  Filled 2015-07-09: qty 0.27

## 2015-07-09 NOTE — Telephone Encounter (Signed)
Lft msg for pt confirming labs added to 07/22 before injection.... Mailed out schedule KJ

## 2015-07-14 ENCOUNTER — Other Ambulatory Visit: Payer: Self-pay | Admitting: *Deleted

## 2015-07-14 ENCOUNTER — Encounter: Payer: Self-pay | Admitting: Hematology and Oncology

## 2015-07-14 ENCOUNTER — Ambulatory Visit (HOSPITAL_BASED_OUTPATIENT_CLINIC_OR_DEPARTMENT_OTHER): Payer: Self-pay | Admitting: Hematology and Oncology

## 2015-07-14 ENCOUNTER — Other Ambulatory Visit (HOSPITAL_BASED_OUTPATIENT_CLINIC_OR_DEPARTMENT_OTHER): Payer: Self-pay

## 2015-07-14 VITALS — BP 129/65 | HR 56 | Temp 98.1°F | Resp 18 | Wt 146.4 lb

## 2015-07-14 DIAGNOSIS — L270 Generalized skin eruption due to drugs and medicaments taken internally: Secondary | ICD-10-CM

## 2015-07-14 DIAGNOSIS — D696 Thrombocytopenia, unspecified: Secondary | ICD-10-CM

## 2015-07-14 DIAGNOSIS — R7401 Elevation of levels of liver transaminase levels: Secondary | ICD-10-CM

## 2015-07-14 DIAGNOSIS — D693 Immune thrombocytopenic purpura: Secondary | ICD-10-CM

## 2015-07-14 DIAGNOSIS — R740 Nonspecific elevation of levels of transaminase and lactic acid dehydrogenase [LDH]: Secondary | ICD-10-CM

## 2015-07-14 HISTORY — DX: Generalized skin eruption due to drugs and medicaments taken internally: L27.0

## 2015-07-14 LAB — COMPREHENSIVE METABOLIC PANEL (CC13)
ALT: 31 U/L (ref 0–55)
AST: 28 U/L (ref 5–34)
Albumin: 4.3 g/dL (ref 3.5–5.0)
Alkaline Phosphatase: 58 U/L (ref 40–150)
Anion Gap: 8 mEq/L (ref 3–11)
BUN: 15.3 mg/dL (ref 7.0–26.0)
CO2: 25 meq/L (ref 22–29)
Calcium: 9.7 mg/dL (ref 8.4–10.4)
Chloride: 107 mEq/L (ref 98–109)
Creatinine: 0.8 mg/dL (ref 0.7–1.3)
EGFR: >90 mL/min/{1.73_m2} (ref >90)
Glucose: 75 mg/dl (ref 70–140)
POTASSIUM: 3.9 meq/L (ref 3.5–5.1)
Sodium: 140 mEq/L (ref 136–145)
TOTAL PROTEIN: 7.1 g/dL (ref 6.4–8.3)
Total Bilirubin: 0.77 mg/dL (ref 0.20–1.20)

## 2015-07-14 LAB — CBC WITH DIFFERENTIAL/PLATELET
BASO%: 0.7 % (ref 0.0–2.0)
BASOS ABS: 0.1 10*3/uL (ref 0.0–0.1)
EOS ABS: 0.4 10*3/uL (ref 0.0–0.5)
EOS%: 4.3 % (ref 0.0–7.0)
HCT: 43 % (ref 38.4–49.9)
HGB: 14 g/dL (ref 13.0–17.1)
LYMPH#: 2.2 10*3/uL (ref 0.9–3.3)
LYMPH%: 24.5 % (ref 14.0–49.0)
MCH: 25.9 pg — AB (ref 27.2–33.4)
MCHC: 32.6 g/dL (ref 32.0–36.0)
MCV: 79.5 fL (ref 79.3–98.0)
MONO#: 0.8 10*3/uL (ref 0.1–0.9)
MONO%: 8.5 % (ref 0.0–14.0)
NEUT#: 5.5 10*3/uL (ref 1.5–6.5)
NEUT%: 62 % (ref 39.0–75.0)
Platelets: 130 10*3/uL — ABNORMAL LOW (ref 140–400)
RBC: 5.41 10*6/uL (ref 4.20–5.82)
RDW: 13.9 % (ref 11.0–14.6)
WBC: 8.9 10*3/uL (ref 4.0–10.3)

## 2015-07-14 MED ORDER — CLINDAMYCIN PHOSPHATE 1 % EX GEL: Freq: Two times a day (BID) | CUTANEOUS | Status: DC

## 2015-07-14 MED ORDER — HYDROCORTISONE 1 % EX OINT: 1.0000 "application " | TOPICAL_OINTMENT | Freq: Two times a day (BID) | CUTANEOUS | Status: DC

## 2015-07-14 NOTE — Progress Notes (Signed)
[facility] OFFICE PROGRESS NOTE  No PCP Per Patient SUMMARY OF HEMATOLOGIC HISTORY:  John Brennan 29 y.o. was recently diagnosed with severe ITP. He was recently hospitalized for similar reason of in April 2016, twice, and had received IVIG and corticosteroid therapy. Please see my initial consult note dated 03/26/2015 for further details. The patient was started on IVIG, dexamethasone, Solu-Medrol and prednisone. He is consider steroid refractory. On 04/23/2015, he is started on CellCept 250 mg twice a day From 04/30/2015 to 04/28/2015, he was readmitted to the hospital due to bleeding. Bone marrow aspirate and biopsy performed on 04/27/2015 confirmed diagnosis of ITP. He was started also in addition to all the above, Nplate injection weekly On 05/04/2015, he received his last platelet transfusion. Starting 05/07/2015, prednisone taper was initiated. On 05/31/2015, prednisone was discontinued and the patient was placed on CellCept along with endplate  INTERVAL HISTORY: John Brennan 29 y.o. male returns for  Urgent evaluation.  an interpreter was present. He said to have significant skin rash on his face his neck and the back.  This started approximately 1 week ago. 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 The rash is itchy. The patient denies any recent signs or symptoms of bleeding such as spontaneous epistaxis, hematuria or hematochezia.   I have reviewed the past medical history, past surgical history, social history and family history with the patient and they are unchanged from previous note.  ALLERGIES:  has No Known Allergies.  MEDICATIONS:  Current Outpatient Prescriptions  Medication Sig Dispense Refill  . acetaminophen (TYLENOL) 325 MG tablet Take 2 tablets (650 mg total) by mouth every 6 (six) hours as needed for mild pain, fever or headache (or Fever >/= 101).    . mycophenolate (CELLCEPT) 500 MG tablet Take 1.5 tablets (750 mg  total) by mouth 2 (two) times daily. 90 tablet 3  . clindamycin (CLINDAGEL) 1 % gel Apply topically 2 (two) times daily. 30 g 0  . hydrocortisone 1 % ointment Apply 1 application topically 2 (two) times daily. Large tube available 30 g 0   No current facility-administered medications for this visit.     REVIEW OF SYSTEMS:   Constitutional: Denies fevers, chills or night sweats Eyes: Denies blurriness of vision Ears, nose, mouth, throat, and face: Denies mucositis or sore throat Respiratory: Denies cough, dyspnea or wheezes Cardiovascular: Denies palpitation, chest discomfort or lower extremity swelling Gastrointestinal:  Denies nausea, heartburn or change in bowel habits Lymphatics: Denies new lymphadenopathy or easy bruising Neurological:Denies numbness, tingling or new weaknesses Behavioral/Psych: Mood is stable, no new changes  All other systems were reviewed with the patient and are negative.  PHYSICAL EXAMINATION: ECOG PERFORMANCE STATUS: 0 - Asymptomatic  Filed Vitals:   07/14/15 1350  BP: 129/65  Pulse: 56  Temp: 98.1 F (36.7 C)  Resp: 18   Filed Weights   07/14/15 1350  Weight: 146 lb 6.4 oz (66.407 kg)    GENERAL:alert, no distress and comfortable SKIN:  He had acneform rash on his face, his neck and his back. EYES: normal, Conjunctiva are pink and non-injected, sclera clear OROPHARYNX:no exudate, no erythema and lips, buccal mucosa, and tongue normal  NECK: supple, thyroid normal size, non-tender, without nodularity LYMPH:  no palpable lymphadenopathy in the cervical, axillary or inguinal LUNGS: clear to auscultation and percussion with normal breathing effort HEART: regular rate & rhythm and no murmurs and no lower extremity edema ABDOMEN:abdomen soft, non-tender and normal bowel sounds Musculoskeletal:no  cyanosis of digits and no clubbing  NEURO: alert & oriented x 3 with fluent speech, no focal motor/sensory deficits  LABORATORY DATA:  I have reviewed the  data as listed No results found for this or any previous visit (from the past 48 hour(s)).  Lab Results  Component Value Date   WBC 8.9 07/14/2015   HGB 14.0 07/14/2015   HCT 43.0 07/14/2015   MCV 79.5 07/14/2015   PLT 130* 07/14/2015   ASSESSMENT & PLAN:  ITP (idiopathic thrombocytopenic purpura) Repeat bone marrow aspirate and biopsy confirmed diagnosis of ITP. I recommend we continue prednisone taper and he has successfully stopped prednisone on Monday, 05/31/2015 He will also come here on a weekly basis to get Nplate injection. He does not need platelet transfusion as long as his platelet count is greater than 10,000, unless he is bleeding  he developed significant skin rash that could be exacerbated by recent sun exposure while on CellCept.  I recommend reducing CellCept to 500 mg twice a day.  I will check on him again next week.  we also discussed about other treatment options including splenectomy.    Drug-induced skin rash  He has skin reaction likely due to excessive sun exposure while on Cellcept. I recommend reducing the dose of CellCept to 500 mg BID and start him on hydrocortisone cream and clindamycin.  I plan to reassess again next week.  Transaminasemia  He was seen by GI recently and diagnosis was likely fatty liver disease. His most recent liver function tests were within normal limits    All questions were answered. The patient knows to call the clinic with any problems, questions or concerns. No barriers to learning was detected.  I spent 25 minutes counseling the patient face to face. The total time spent in the appointment was 30 minutes and more than 50% was on counseling.     Bertis RuddyGORSUCH, Shamecca Whitebread, MD 7/20/20165:15 PM

## 2015-07-14 NOTE — Assessment & Plan Note (Signed)
He has skin reaction likely due to excessive sun exposure while on Cellcept. I recommend reducing the dose of CellCept to 500 mg BID and start him on hydrocortisone cream and clindamycin.  I plan to reassess again next week.

## 2015-07-14 NOTE — Assessment & Plan Note (Signed)
He was seen by GI recently and diagnosis was likely fatty liver disease. His most recent liver function tests were within normal limits

## 2015-07-14 NOTE — Assessment & Plan Note (Addendum)
Repeat bone marrow aspirate and biopsy confirmed diagnosis of ITP. I recommend we continue prednisone taper and he has successfully stopped prednisone on Monday, 05/31/2015 He will also come here on a weekly basis to get Nplate injection. He does not need platelet transfusion as long as his platelet count is greater than 10,000, unless he is bleeding  he developed significant skin rash that could be exacerbated by recent sun exposure while on CellCept.  I recommend reducing CellCept to 500 mg twice a day.  I will check on him again next week.  we also discussed about other treatment options including splenectomy.

## 2015-07-16 ENCOUNTER — Ambulatory Visit (HOSPITAL_BASED_OUTPATIENT_CLINIC_OR_DEPARTMENT_OTHER): Payer: Self-pay

## 2015-07-16 ENCOUNTER — Other Ambulatory Visit (HOSPITAL_BASED_OUTPATIENT_CLINIC_OR_DEPARTMENT_OTHER): Payer: Self-pay

## 2015-07-16 VITALS — BP 134/61 | HR 62 | Temp 98.2°F

## 2015-07-16 DIAGNOSIS — D693 Immune thrombocytopenic purpura: Secondary | ICD-10-CM

## 2015-07-16 DIAGNOSIS — D696 Thrombocytopenia, unspecified: Secondary | ICD-10-CM

## 2015-07-16 DIAGNOSIS — R740 Nonspecific elevation of levels of transaminase and lactic acid dehydrogenase [LDH]: Secondary | ICD-10-CM

## 2015-07-16 LAB — CBC WITH DIFFERENTIAL/PLATELET
BASO%: 0.7 % (ref 0.0–2.0)
Basophils Absolute: 0.1 10*3/uL (ref 0.0–0.1)
EOS ABS: 0.3 10*3/uL (ref 0.0–0.5)
EOS%: 3.6 % (ref 0.0–7.0)
HCT: 45.3 % (ref 38.4–49.9)
HEMOGLOBIN: 14.8 g/dL (ref 13.0–17.1)
LYMPH#: 1.6 10*3/uL (ref 0.9–3.3)
LYMPH%: 20.3 % (ref 14.0–49.0)
MCH: 26 pg — ABNORMAL LOW (ref 27.2–33.4)
MCHC: 32.7 g/dL (ref 32.0–36.0)
MCV: 79.6 fL (ref 79.3–98.0)
MONO#: 0.6 10*3/uL (ref 0.1–0.9)
MONO%: 7.1 % (ref 0.0–14.0)
NEUT%: 68.3 % (ref 39.0–75.0)
NEUTROS ABS: 5.5 10*3/uL (ref 1.5–6.5)
Platelets: 64 10*3/uL — ABNORMAL LOW (ref 140–400)
RBC: 5.69 10*6/uL (ref 4.20–5.82)
RDW: 13.8 % (ref 11.0–14.6)
WBC: 8 10*3/uL (ref 4.0–10.3)

## 2015-07-16 MED ORDER — ROMIPLOSTIM 250 MCG ~~LOC~~ SOLR
2.0000 ug/kg | SUBCUTANEOUS | Status: DC
  Administered 2015-07-16: 135 ug via SUBCUTANEOUS
  Filled 2015-07-16: qty 0.27

## 2015-07-19 NOTE — Progress Notes (Signed)
Agree w/ Ms. Hvozdovic's note and mangement.  

## 2015-07-23 ENCOUNTER — Other Ambulatory Visit (HOSPITAL_BASED_OUTPATIENT_CLINIC_OR_DEPARTMENT_OTHER): Payer: Self-pay

## 2015-07-23 ENCOUNTER — Other Ambulatory Visit: Payer: Self-pay | Admitting: Hematology and Oncology

## 2015-07-23 ENCOUNTER — Ambulatory Visit (HOSPITAL_BASED_OUTPATIENT_CLINIC_OR_DEPARTMENT_OTHER): Payer: Self-pay

## 2015-07-23 ENCOUNTER — Telehealth: Payer: Self-pay | Admitting: Hematology and Oncology

## 2015-07-23 ENCOUNTER — Ambulatory Visit (HOSPITAL_BASED_OUTPATIENT_CLINIC_OR_DEPARTMENT_OTHER): Payer: Self-pay | Admitting: Hematology and Oncology

## 2015-07-23 ENCOUNTER — Encounter: Payer: Self-pay | Admitting: Hematology and Oncology

## 2015-07-23 VITALS — BP 139/68 | HR 59 | Temp 98.4°F | Resp 20 | Ht 62.0 in | Wt 148.2 lb

## 2015-07-23 DIAGNOSIS — D696 Thrombocytopenia, unspecified: Secondary | ICD-10-CM

## 2015-07-23 DIAGNOSIS — D693 Immune thrombocytopenic purpura: Secondary | ICD-10-CM

## 2015-07-23 DIAGNOSIS — L270 Generalized skin eruption due to drugs and medicaments taken internally: Secondary | ICD-10-CM

## 2015-07-23 LAB — COMPREHENSIVE METABOLIC PANEL (CC13)
ALT: 27 U/L (ref 0–55)
ANION GAP: 8 meq/L (ref 3–11)
AST: 27 U/L (ref 5–34)
Albumin: 4.3 g/dL (ref 3.5–5.0)
Alkaline Phosphatase: 58 U/L (ref 40–150)
BILIRUBIN TOTAL: 0.76 mg/dL (ref 0.20–1.20)
BUN: 12.8 mg/dL (ref 7.0–26.0)
CALCIUM: 9.3 mg/dL (ref 8.4–10.4)
CO2: 26 meq/L (ref 22–29)
Chloride: 108 mEq/L (ref 98–109)
Creatinine: 0.9 mg/dL (ref 0.7–1.3)
EGFR: >90 mL/min/{1.73_m2} (ref >90)
Glucose: 118 mg/dl (ref 70–140)
POTASSIUM: 3.9 meq/L (ref 3.5–5.1)
Sodium: 142 mEq/L (ref 136–145)
TOTAL PROTEIN: 6.9 g/dL (ref 6.4–8.3)

## 2015-07-23 LAB — CBC WITH DIFFERENTIAL/PLATELET
BASO%: 0.7 % (ref 0.0–2.0)
Basophils Absolute: 0.1 10*3/uL (ref 0.0–0.1)
EOS%: 3.8 % (ref 0.0–7.0)
Eosinophils Absolute: 0.3 10*3/uL (ref 0.0–0.5)
HEMATOCRIT: 43.4 % (ref 38.4–49.9)
HEMOGLOBIN: 14.2 g/dL (ref 13.0–17.1)
LYMPH%: 21.6 % (ref 14.0–49.0)
MCH: 25.9 pg — ABNORMAL LOW (ref 27.2–33.4)
MCHC: 32.7 g/dL (ref 32.0–36.0)
MCV: 79.1 fL — AB (ref 79.3–98.0)
MONO#: 0.5 10*3/uL (ref 0.1–0.9)
MONO%: 6.6 % (ref 0.0–14.0)
NEUT#: 5.5 10*3/uL (ref 1.5–6.5)
NEUT%: 67.3 % (ref 39.0–75.0)
Platelets: 107 10*3/uL — ABNORMAL LOW (ref 140–400)
RBC: 5.49 10*6/uL (ref 4.20–5.82)
RDW: 13.7 % (ref 11.0–14.6)
WBC: 8.1 10*3/uL (ref 4.0–10.3)
lymph#: 1.8 10*3/uL (ref 0.9–3.3)

## 2015-07-23 MED ORDER — ROMIPLOSTIM 250 MCG ~~LOC~~ SOLR
2.0000 ug/kg | SUBCUTANEOUS | Status: DC
  Administered 2015-07-23: 135 ug via SUBCUTANEOUS
  Filled 2015-07-23: qty 0.27

## 2015-07-23 NOTE — Assessment & Plan Note (Signed)
Repeat bone marrow aspirate and biopsy confirmed diagnosis of ITP. I recommend we continue prednisone taper and he has successfully stopped prednisone on Monday, 05/31/2015 He will also come here on a weekly basis to get Nplate injection. He does not need platelet transfusion as long as his platelet count is greater than 10,000, unless he is bleeding  he developed significant skin rash that could be exacerbated by recent sun exposure while on CellCept.  I recommend reducing CellCept to 500 mg twice a day.  he is doing well and we will continue the same.

## 2015-07-23 NOTE — Assessment & Plan Note (Signed)
He has skin reaction likely due to excessive sun exposure while on Cellcept. I have reduced the dose of CellCept to 500 mg BID and started him on hydrocortisone cream and clindamycin.  this is improving. I recommend we continue the same.

## 2015-07-23 NOTE — Progress Notes (Signed)
[facility] OFFICE PROGRESS NOTE  No PCP Per Patient SUMMARY OF HEMATOLOGIC HISTORY:  John Brennan 29 y.o. was recently diagnosed with severe ITP. He was recently hospitalized for similar reason of in April 2016, twice, and had received IVIG and corticosteroid therapy. Please see my initial consult note dated 03/26/2015 for further details. The patient was started on IVIG, dexamethasone, Solu-Medrol and prednisone. He is consider steroid refractory. On 04/23/2015, he is started on CellCept 250 mg twice a day From 04/30/2015 to 04/28/2015, he was readmitted to the hospital due to bleeding. Bone marrow aspirate and biopsy performed on 04/27/2015 confirmed diagnosis of ITP. He was started also in addition to all the above, Nplate injection weekly On 05/04/2015, he received his last platelet transfusion. Starting 05/07/2015, prednisone taper was initiated. On 05/31/2015, prednisone was discontinued and the patient was placed on CellCept along with Nplate  on 07/14/2015, CellCept was reduced to 5 mg twice a day along with weekly Nplate injection  INTERVAL HISTORY: Interpreter is present John Brennan 29 y.o. male returns for Further follow-up. His skin rash has improved.  his energy level is the same. The patient denies any recent signs or symptoms of bleeding such as spontaneous epistaxis, hematuria or hematochezia.   I have reviewed the past medical history, past surgical history, social history and family history with the patient and they are unchanged from previous note.  ALLERGIES:  has No Known Allergies.  MEDICATIONS:  Current Outpatient Prescriptions  Medication Sig Dispense Refill  . acetaminophen (TYLENOL) 325 MG tablet Take 2 tablets (650 mg total) by mouth every 6 (six) hours as needed for mild pain, fever or headache (or Fever >/= 101).    . clindamycin (CLINDAGEL) 1 % gel Apply topically 2 (two) times daily. 30 g 0  . hydrocortisone 1 % ointment Apply 1  application topically 2 (two) times daily. Large tube available 30 g 0  . mycophenolate (CELLCEPT) 500 MG tablet Take 500 mg by mouth 2 (two) times daily.     No current facility-administered medications for this visit.     REVIEW OF SYSTEMS:   Constitutional: Denies fevers, chills or night sweats Eyes: Denies blurriness of vision Ears, nose, mouth, throat, and face: Denies mucositis or sore throat Respiratory: Denies cough, dyspnea or wheezes Cardiovascular: Denies palpitation, chest discomfort or lower extremity swelling Gastrointestinal:  Denies nausea, heartburn or change in bowel habits Lymphatics: Denies new lymphadenopathy or easy bruising Neurological:Denies numbness, tingling or new weaknesses Behavioral/Psych: Mood is stable, no new changes  All other systems were reviewed with the patient and are negative.  PHYSICAL EXAMINATION: ECOG PERFORMANCE STATUS: 0 - Asymptomatic  Filed Vitals:   07/23/15 1446  BP: 139/68  Pulse: 59  Temp: 98.4 F (36.9 C)  Resp: 20   Filed Weights   07/23/15 1446  Weight: 148 lb 3.2 oz (67.223 kg)    GENERAL:alert, no distress and comfortable SKIN:  His skin rash has improved. EYES: normal, Conjunctiva are pink and non-injected, sclera clear OROPHARYNX:no exudate, no erythema and lips, buccal mucosa, and tongue normal  Musculoskeletal:no cyanosis of digits and no clubbing  NEURO: alert & oriented x 3 with fluent speech, no focal motor/sensory deficits  LABORATORY DATA:  I have reviewed the data as listed Results for orders placed or performed in visit on 07/23/15 (from the past 48 hour(s))  CBC with Differential/Platelet     Status: Abnormal   Collection Time: 07/23/15  2:34 PM  Result Value Ref Range   WBC 8.1  4.0 - 10.3 10e3/uL   NEUT# 5.5 1.5 - 6.5 10e3/uL   HGB 14.2 13.0 - 17.1 g/dL   HCT 16.1 09.6 - 04.5 %   Platelets 107 (L) 140 - 400 10e3/uL   MCV 79.1 (L) 79.3 - 98.0 fL   MCH 25.9 (L) 27.2 - 33.4 pg   MCHC 32.7 32.0 -  36.0 g/dL   RBC 4.09 8.11 - 9.14 10e6/uL   RDW 13.7 11.0 - 14.6 %   lymph# 1.8 0.9 - 3.3 10e3/uL   MONO# 0.5 0.1 - 0.9 10e3/uL   Eosinophils Absolute 0.3 0.0 - 0.5 10e3/uL   Basophils Absolute 0.1 0.0 - 0.1 10e3/uL   NEUT% 67.3 39.0 - 75.0 %   LYMPH% 21.6 14.0 - 49.0 %   MONO% 6.6 0.0 - 14.0 %   EOS% 3.8 0.0 - 7.0 %   BASO% 0.7 0.0 - 2.0 %    Lab Results  Component Value Date   WBC 8.1 07/23/2015   HGB 14.2 07/23/2015   HCT 43.4 07/23/2015   MCV 79.1* 07/23/2015   PLT 107* 07/23/2015    ASSESSMENT & PLAN:  ITP (idiopathic thrombocytopenic purpura) Repeat bone marrow aspirate and biopsy confirmed diagnosis of ITP. I recommend we continue prednisone taper and he has successfully stopped prednisone on Monday, 05/31/2015 He will also come here on a weekly basis to get Nplate injection. He does not need platelet transfusion as long as his platelet count is greater than 10,000, unless he is bleeding  he developed significant skin rash that could be exacerbated by recent sun exposure while on CellCept.  I recommend reducing CellCept to 500 mg twice a day.  he is doing well and we will continue the same.  Drug-induced skin rash  He has skin reaction likely due to excessive sun exposure while on Cellcept. I have reduced the dose of CellCept to 500 mg BID and started him on hydrocortisone cream and clindamycin.  this is improving. I recommend we continue the same.     All questions were answered. The patient knows to call the clinic with any problems, questions or concerns. No barriers to learning was detected.  I spent 15 minutes counseling the patient face to face. The total time spent in the appointment was 20 minutes and more than 50% was on counseling.     Catie Chiao, MD 7/29/20162:58 PM

## 2015-07-23 NOTE — Telephone Encounter (Signed)
Pt confirmed labs/ov/inj per 07/29 POF, gave pt AVS and Calendar... KJ ° ° °

## 2015-07-30 ENCOUNTER — Other Ambulatory Visit (HOSPITAL_BASED_OUTPATIENT_CLINIC_OR_DEPARTMENT_OTHER): Payer: Self-pay

## 2015-07-30 ENCOUNTER — Ambulatory Visit: Payer: Self-pay

## 2015-07-30 ENCOUNTER — Other Ambulatory Visit: Payer: Self-pay

## 2015-07-30 ENCOUNTER — Ambulatory Visit (HOSPITAL_BASED_OUTPATIENT_CLINIC_OR_DEPARTMENT_OTHER): Payer: Self-pay

## 2015-07-30 ENCOUNTER — Telehealth: Payer: Self-pay | Admitting: *Deleted

## 2015-07-30 ENCOUNTER — Other Ambulatory Visit: Payer: Self-pay | Admitting: *Deleted

## 2015-07-30 DIAGNOSIS — D693 Immune thrombocytopenic purpura: Secondary | ICD-10-CM

## 2015-07-30 DIAGNOSIS — D696 Thrombocytopenia, unspecified: Secondary | ICD-10-CM

## 2015-07-30 LAB — COMPREHENSIVE METABOLIC PANEL (CC13)
ALT: 35 U/L (ref 0–55)
AST: 28 U/L (ref 5–34)
Albumin: 4.4 g/dL (ref 3.5–5.0)
Alkaline Phosphatase: 58 U/L (ref 40–150)
Anion Gap: 7 mEq/L (ref 3–11)
BILIRUBIN TOTAL: 0.74 mg/dL (ref 0.20–1.20)
BUN: 13.1 mg/dL (ref 7.0–26.0)
CHLORIDE: 107 meq/L (ref 98–109)
CO2: 28 meq/L (ref 22–29)
Calcium: 9.5 mg/dL (ref 8.4–10.4)
Creatinine: 1 mg/dL (ref 0.7–1.3)
EGFR: >90 mL/min/{1.73_m2} (ref >90)
GLUCOSE: 97 mg/dL (ref 70–140)
POTASSIUM: 3.9 meq/L (ref 3.5–5.1)
SODIUM: 142 meq/L (ref 136–145)
TOTAL PROTEIN: 7.1 g/dL (ref 6.4–8.3)

## 2015-07-30 LAB — CBC WITH DIFFERENTIAL/PLATELET
BASO%: 0.5 % (ref 0.0–2.0)
BASOS ABS: 0 10*3/uL (ref 0.0–0.1)
EOS%: 4.7 % (ref 0.0–7.0)
Eosinophils Absolute: 0.4 10*3/uL (ref 0.0–0.5)
HCT: 45.1 % (ref 38.4–49.9)
HGB: 14.9 g/dL (ref 13.0–17.1)
LYMPH#: 1.9 10*3/uL (ref 0.9–3.3)
LYMPH%: 23.4 % (ref 14.0–49.0)
MCH: 26 pg — ABNORMAL LOW (ref 27.2–33.4)
MCHC: 33 g/dL (ref 32.0–36.0)
MCV: 78.7 fL — AB (ref 79.3–98.0)
MONO#: 0.5 10*3/uL (ref 0.1–0.9)
MONO%: 6.6 % (ref 0.0–14.0)
NEUT#: 5.3 10*3/uL (ref 1.5–6.5)
NEUT%: 64.8 % (ref 39.0–75.0)
NRBC: 0 % (ref 0–0)
PLATELETS: 39 10*3/uL — AB (ref 140–400)
RBC: 5.73 10*6/uL (ref 4.20–5.82)
RDW: 14 % (ref 11.0–14.6)
WBC: 8.2 10*3/uL (ref 4.0–10.3)

## 2015-07-30 MED ORDER — CLINDAMYCIN PHOSPHATE 1 % EX GEL: CUTANEOUS | Status: DC

## 2015-07-30 MED ORDER — ROMIPLOSTIM 250 MCG ~~LOC~~ SOLR
3.0000 ug/kg | SUBCUTANEOUS | Status: DC
  Administered 2015-07-30: 200 ug via SUBCUTANEOUS
  Filled 2015-07-30: qty 0.4

## 2015-07-30 NOTE — Telephone Encounter (Signed)
-----   Message from Artis Delay, MD sent at 07/30/2015  2:24 PM EDT ----- Regarding: cellcept Please tell patient to increase cellcept to 750 mg morning and 500 mg evening ----- Message -----    From: [facility] in [facility] Interface    Sent: 07/30/2015   2:19 PM      To: Artis Delay, MD

## 2015-07-30 NOTE — Telephone Encounter (Signed)
Written instructions given to pt by Injection RN.

## 2015-08-06 ENCOUNTER — Ambulatory Visit (HOSPITAL_BASED_OUTPATIENT_CLINIC_OR_DEPARTMENT_OTHER): Payer: Self-pay

## 2015-08-06 ENCOUNTER — Other Ambulatory Visit (HOSPITAL_BASED_OUTPATIENT_CLINIC_OR_DEPARTMENT_OTHER): Payer: Self-pay

## 2015-08-06 VITALS — BP 133/76 | HR 64 | Temp 98.2°F

## 2015-08-06 DIAGNOSIS — D696 Thrombocytopenia, unspecified: Secondary | ICD-10-CM

## 2015-08-06 DIAGNOSIS — D693 Immune thrombocytopenic purpura: Secondary | ICD-10-CM

## 2015-08-06 LAB — CBC WITH DIFFERENTIAL/PLATELET
BASO%: 1 % (ref 0.0–2.0)
BASOS ABS: 0.1 10*3/uL (ref 0.0–0.1)
EOS%: 3.2 % (ref 0.0–7.0)
Eosinophils Absolute: 0.3 10*3/uL (ref 0.0–0.5)
HEMATOCRIT: 48.1 % (ref 38.4–49.9)
HEMOGLOBIN: 15.4 g/dL (ref 13.0–17.1)
LYMPH%: 18.8 % (ref 14.0–49.0)
MCH: 25.1 pg — ABNORMAL LOW (ref 27.2–33.4)
MCHC: 32.1 g/dL (ref 32.0–36.0)
MCV: 78.2 fL — ABNORMAL LOW (ref 79.3–98.0)
MONO#: 0.4 10*3/uL (ref 0.1–0.9)
MONO%: 5.2 % (ref 0.0–14.0)
NEUT#: 6.2 10*3/uL (ref 1.5–6.5)
NEUT%: 71.8 % (ref 39.0–75.0)
Platelets: 154 10*3/uL (ref 140–400)
RBC: 6.16 10*6/uL — ABNORMAL HIGH (ref 4.20–5.82)
RDW: 15.1 % — ABNORMAL HIGH (ref 11.0–14.6)
WBC: 8.6 10*3/uL (ref 4.0–10.3)
lymph#: 1.6 10*3/uL (ref 0.9–3.3)

## 2015-08-06 LAB — COMPREHENSIVE METABOLIC PANEL (CC13)
ALK PHOS: 58 U/L (ref 40–150)
ALT: 29 U/L (ref 0–55)
AST: 28 U/L (ref 5–34)
Albumin: 4.5 g/dL (ref 3.5–5.0)
Anion Gap: 10 mEq/L (ref 3–11)
BILIRUBIN TOTAL: 0.85 mg/dL (ref 0.20–1.20)
BUN: 12 mg/dL (ref 7.0–26.0)
CO2: 23 meq/L (ref 22–29)
Calcium: 9.4 mg/dL (ref 8.4–10.4)
Chloride: 107 mEq/L (ref 98–109)
Creatinine: 0.9 mg/dL (ref 0.7–1.3)
EGFR: >90 mL/min/{1.73_m2} (ref >90)
Glucose: 145 mg/dl — ABNORMAL HIGH (ref 70–140)
Potassium: 3.7 mEq/L (ref 3.5–5.1)
SODIUM: 140 meq/L (ref 136–145)
TOTAL PROTEIN: 7.4 g/dL (ref 6.4–8.3)

## 2015-08-06 MED ORDER — ROMIPLOSTIM 250 MCG ~~LOC~~ SOLR
3.0000 ug/kg | SUBCUTANEOUS | Status: DC
  Administered 2015-08-06: 200 ug via SUBCUTANEOUS
  Filled 2015-08-06: qty 0.4

## 2015-08-06 NOTE — Progress Notes (Signed)
Per Dr. Bertis Ruddy,  Pt to decrease Cellcept to 500 mg once a day.

## 2015-08-13 ENCOUNTER — Ambulatory Visit (HOSPITAL_BASED_OUTPATIENT_CLINIC_OR_DEPARTMENT_OTHER): Payer: Self-pay

## 2015-08-13 ENCOUNTER — Other Ambulatory Visit (HOSPITAL_BASED_OUTPATIENT_CLINIC_OR_DEPARTMENT_OTHER): Payer: Self-pay

## 2015-08-13 VITALS — BP 137/77 | HR 70 | Temp 98.5°F

## 2015-08-13 DIAGNOSIS — D693 Immune thrombocytopenic purpura: Secondary | ICD-10-CM

## 2015-08-13 DIAGNOSIS — D696 Thrombocytopenia, unspecified: Secondary | ICD-10-CM

## 2015-08-13 LAB — COMPREHENSIVE METABOLIC PANEL (CC13)
ALT: 52 U/L (ref 0–55)
ANION GAP: 11 meq/L (ref 3–11)
AST: 54 U/L — ABNORMAL HIGH (ref 5–34)
Albumin: 4.3 g/dL (ref 3.5–5.0)
Alkaline Phosphatase: 71 U/L (ref 40–150)
BILIRUBIN TOTAL: 0.39 mg/dL (ref 0.20–1.20)
BUN: 9.2 mg/dL (ref 7.0–26.0)
CALCIUM: 9.7 mg/dL (ref 8.4–10.4)
CO2: 23 meq/L (ref 22–29)
CREATININE: 0.9 mg/dL (ref 0.7–1.3)
Chloride: 107 mEq/L (ref 98–109)
EGFR: >90 mL/min/{1.73_m2} (ref >90)
Glucose: 115 mg/dl (ref 70–140)
Potassium: 3.6 mEq/L (ref 3.5–5.1)
Sodium: 140 mEq/L (ref 136–145)
TOTAL PROTEIN: 7.5 g/dL (ref 6.4–8.3)

## 2015-08-13 LAB — CBC WITH DIFFERENTIAL/PLATELET
BASO%: 0.6 % (ref 0.0–2.0)
Basophils Absolute: 0.1 10*3/uL (ref 0.0–0.1)
EOS%: 3.9 % (ref 0.0–7.0)
Eosinophils Absolute: 0.4 10*3/uL (ref 0.0–0.5)
HEMATOCRIT: 46.1 % (ref 38.4–49.9)
HGB: 15.1 g/dL (ref 13.0–17.1)
LYMPH#: 2 10*3/uL (ref 0.9–3.3)
LYMPH%: 20.3 % (ref 14.0–49.0)
MCH: 25.4 pg — ABNORMAL LOW (ref 27.2–33.4)
MCHC: 32.8 g/dL (ref 32.0–36.0)
MCV: 77.4 fL — ABNORMAL LOW (ref 79.3–98.0)
MONO#: 0.6 10*3/uL (ref 0.1–0.9)
MONO%: 6 % (ref 0.0–14.0)
NEUT%: 69.2 % (ref 39.0–75.0)
NEUTROS ABS: 6.9 10*3/uL — AB (ref 1.5–6.5)
PLATELETS: 56 10*3/uL — AB (ref 140–400)
RBC: 5.95 10*6/uL — AB (ref 4.20–5.82)
RDW: 14.8 % — ABNORMAL HIGH (ref 11.0–14.6)
WBC: 10 10*3/uL (ref 4.0–10.3)

## 2015-08-13 MED ORDER — ROMIPLOSTIM 250 MCG ~~LOC~~ SOLR
200.0000 ug | SUBCUTANEOUS | Status: DC
  Administered 2015-08-13: 200 ug via SUBCUTANEOUS
  Filled 2015-08-13: qty 0.4

## 2015-08-20 ENCOUNTER — Other Ambulatory Visit (HOSPITAL_BASED_OUTPATIENT_CLINIC_OR_DEPARTMENT_OTHER): Payer: Self-pay

## 2015-08-20 ENCOUNTER — Ambulatory Visit (HOSPITAL_BASED_OUTPATIENT_CLINIC_OR_DEPARTMENT_OTHER): Payer: Self-pay

## 2015-08-20 VITALS — BP 142/67 | HR 67 | Temp 98.2°F

## 2015-08-20 DIAGNOSIS — D696 Thrombocytopenia, unspecified: Secondary | ICD-10-CM

## 2015-08-20 DIAGNOSIS — D693 Immune thrombocytopenic purpura: Secondary | ICD-10-CM

## 2015-08-20 LAB — CBC WITH DIFFERENTIAL/PLATELET
BASO%: 0.6 % (ref 0.0–2.0)
BASOS ABS: 0.1 10*3/uL (ref 0.0–0.1)
EOS ABS: 0.4 10*3/uL (ref 0.0–0.5)
EOS%: 3.4 % (ref 0.0–7.0)
HEMATOCRIT: 45.4 % (ref 38.4–49.9)
HEMOGLOBIN: 15 g/dL (ref 13.0–17.1)
LYMPH#: 2.1 10*3/uL (ref 0.9–3.3)
LYMPH%: 20.1 % (ref 14.0–49.0)
MCH: 26.1 pg — ABNORMAL LOW (ref 27.2–33.4)
MCHC: 33 g/dL (ref 32.0–36.0)
MCV: 79 fL — ABNORMAL LOW (ref 79.3–98.0)
MONO#: 0.6 10*3/uL (ref 0.1–0.9)
MONO%: 5.8 % (ref 0.0–14.0)
NEUT%: 70.1 % (ref 39.0–75.0)
NEUTROS ABS: 7.4 10*3/uL — AB (ref 1.5–6.5)
NRBC: 0 % (ref 0–0)
PLATELETS: 32 10*3/uL — AB (ref 140–400)
RBC: 5.75 10*6/uL (ref 4.20–5.82)
RDW: 13.9 % (ref 11.0–14.6)
WBC: 10.5 10*3/uL — AB (ref 4.0–10.3)

## 2015-08-20 LAB — COMPREHENSIVE METABOLIC PANEL (CC13)
ALBUMIN: 4.4 g/dL (ref 3.5–5.0)
ALK PHOS: 67 U/L (ref 40–150)
ALT: 37 U/L (ref 0–55)
ANION GAP: 10 meq/L (ref 3–11)
AST: 23 U/L (ref 5–34)
BILIRUBIN TOTAL: 0.69 mg/dL (ref 0.20–1.20)
BUN: 14.9 mg/dL (ref 7.0–26.0)
CO2: 25 meq/L (ref 22–29)
CREATININE: 0.8 mg/dL (ref 0.7–1.3)
Calcium: 9.5 mg/dL (ref 8.4–10.4)
Chloride: 107 mEq/L (ref 98–109)
EGFR: >90 mL/min/{1.73_m2} (ref >90)
Glucose: 85 mg/dl (ref 70–140)
Potassium: 3.9 mEq/L (ref 3.5–5.1)
Sodium: 142 mEq/L (ref 136–145)
TOTAL PROTEIN: 7.4 g/dL (ref 6.4–8.3)

## 2015-08-20 MED ORDER — ROMIPLOSTIM 250 MCG ~~LOC~~ SOLR
250.0000 ug | SUBCUTANEOUS | Status: DC
  Administered 2015-08-20: 250 ug via SUBCUTANEOUS
  Filled 2015-08-20: qty 0.5

## 2015-08-20 NOTE — Progress Notes (Signed)
Per Dr Bertis Ruddy  Same dose of Cell cept.  Increase N-plate dose per pharmacy protocol.

## 2015-08-27 ENCOUNTER — Ambulatory Visit (HOSPITAL_BASED_OUTPATIENT_CLINIC_OR_DEPARTMENT_OTHER): Payer: Self-pay

## 2015-08-27 ENCOUNTER — Other Ambulatory Visit (HOSPITAL_BASED_OUTPATIENT_CLINIC_OR_DEPARTMENT_OTHER): Payer: Self-pay

## 2015-08-27 ENCOUNTER — Other Ambulatory Visit: Payer: Self-pay | Admitting: *Deleted

## 2015-08-27 VITALS — BP 103/69 | HR 67 | Temp 98.4°F

## 2015-08-27 DIAGNOSIS — Z23 Encounter for immunization: Secondary | ICD-10-CM

## 2015-08-27 DIAGNOSIS — D696 Thrombocytopenia, unspecified: Secondary | ICD-10-CM

## 2015-08-27 DIAGNOSIS — D693 Immune thrombocytopenic purpura: Secondary | ICD-10-CM

## 2015-08-27 LAB — COMPREHENSIVE METABOLIC PANEL (CC13)
ALBUMIN: 4.1 g/dL (ref 3.5–5.0)
ALK PHOS: 61 U/L (ref 40–150)
ALT: 31 U/L (ref 0–55)
AST: 24 U/L (ref 5–34)
Anion Gap: 8 mEq/L (ref 3–11)
BILIRUBIN TOTAL: 0.51 mg/dL (ref 0.20–1.20)
BUN: 11.8 mg/dL (ref 7.0–26.0)
CO2: 27 meq/L (ref 22–29)
Calcium: 9.4 mg/dL (ref 8.4–10.4)
Chloride: 106 mEq/L (ref 98–109)
Creatinine: 1 mg/dL (ref 0.7–1.3)
EGFR: >90 mL/min/{1.73_m2} (ref >90)
GLUCOSE: 107 mg/dL (ref 70–140)
POTASSIUM: 3.6 meq/L (ref 3.5–5.1)
SODIUM: 141 meq/L (ref 136–145)
TOTAL PROTEIN: 7.2 g/dL (ref 6.4–8.3)

## 2015-08-27 LAB — CBC WITH DIFFERENTIAL/PLATELET
BASO%: 0.8 % (ref 0.0–2.0)
Basophils Absolute: 0.1 10*3/uL (ref 0.0–0.1)
EOS%: 4.1 % (ref 0.0–7.0)
Eosinophils Absolute: 0.4 10*3/uL (ref 0.0–0.5)
HCT: 45 % (ref 38.4–49.9)
HEMOGLOBIN: 14.5 g/dL (ref 13.0–17.1)
LYMPH%: 17.6 % (ref 14.0–49.0)
MCH: 25.3 pg — ABNORMAL LOW (ref 27.2–33.4)
MCHC: 32.3 g/dL (ref 32.0–36.0)
MCV: 78.3 fL — ABNORMAL LOW (ref 79.3–98.0)
MONO#: 0.5 10*3/uL (ref 0.1–0.9)
MONO%: 5.3 % (ref 0.0–14.0)
NEUT%: 72.2 % (ref 39.0–75.0)
NEUTROS ABS: 6.5 10*3/uL (ref 1.5–6.5)
Platelets: 64 10*3/uL — ABNORMAL LOW (ref 140–400)
RBC: 5.75 10*6/uL (ref 4.20–5.82)
RDW: 14.8 % — AB (ref 11.0–14.6)
WBC: 9 10*3/uL (ref 4.0–10.3)
lymph#: 1.6 10*3/uL (ref 0.9–3.3)

## 2015-08-27 MED ORDER — ROMIPLOSTIM INJECTION 500 MCG
340.0000 ug | SUBCUTANEOUS | Status: DC
  Administered 2015-08-27: 340 ug via SUBCUTANEOUS
  Filled 2015-08-27: qty 0.68

## 2015-08-27 MED ORDER — CLINDAMYCIN PHOSPHATE 1 % EX GEL: CUTANEOUS | Status: DC

## 2015-08-27 MED ORDER — INFLUENZA VAC SPLIT QUAD 0.5 ML IM SUSY
0.5000 mL | PREFILLED_SYRINGE | Freq: Once | INTRAMUSCULAR | Status: AC
  Administered 2015-08-27: 0.5 mL via INTRAMUSCULAR
  Filled 2015-08-27: qty 0.5

## 2015-08-27 NOTE — Telephone Encounter (Signed)
Instructed pt per Dr. Bertis Ruddy to continue Cellcept 500 mg once daily.   He verbalized understanding.

## 2015-09-01 ENCOUNTER — Ambulatory Visit (HOSPITAL_COMMUNITY)
Admission: RE | Admit: 2015-09-01 | Discharge: 2015-09-01 | Disposition: A | Discharge status: Home / Self Care | Payer: Self-pay | Source: Ambulatory Visit | Attending: Hematology and Oncology | Admitting: Hematology and Oncology

## 2015-09-01 ENCOUNTER — Ambulatory Visit (HOSPITAL_BASED_OUTPATIENT_CLINIC_OR_DEPARTMENT_OTHER): Payer: Self-pay

## 2015-09-01 ENCOUNTER — Other Ambulatory Visit: Payer: Self-pay | Admitting: *Deleted

## 2015-09-01 ENCOUNTER — Telehealth: Payer: Self-pay | Admitting: *Deleted

## 2015-09-01 VITALS — BP 125/64 | HR 61 | Temp 97.0°F | Resp 18

## 2015-09-01 DIAGNOSIS — D696 Thrombocytopenia, unspecified: Secondary | ICD-10-CM

## 2015-09-01 DIAGNOSIS — D693 Immune thrombocytopenic purpura: Secondary | ICD-10-CM

## 2015-09-01 LAB — COMPREHENSIVE METABOLIC PANEL (CC13)
ALBUMIN: 4.4 g/dL (ref 3.5–5.0)
ALK PHOS: 59 U/L (ref 40–150)
ALT: 35 U/L (ref 0–55)
ANION GAP: 8 meq/L (ref 3–11)
AST: 25 U/L (ref 5–34)
BILIRUBIN TOTAL: 0.65 mg/dL (ref 0.20–1.20)
BUN: 14 mg/dL (ref 7.0–26.0)
CO2: 26 mEq/L (ref 22–29)
CREATININE: 1 mg/dL (ref 0.7–1.3)
Calcium: 9.8 mg/dL (ref 8.4–10.4)
Chloride: 106 mEq/L (ref 98–109)
GLUCOSE: 116 mg/dL (ref 70–140)
Potassium: 3.6 mEq/L (ref 3.5–5.1)
SODIUM: 140 meq/L (ref 136–145)
TOTAL PROTEIN: 7.5 g/dL (ref 6.4–8.3)

## 2015-09-01 LAB — CBC WITH DIFFERENTIAL/PLATELET
BASO%: 0.3 % (ref 0.0–2.0)
BASOS ABS: 0 10*3/uL (ref 0.0–0.1)
EOS ABS: 0.2 10*3/uL (ref 0.0–0.5)
EOS%: 2.6 % (ref 0.0–7.0)
HCT: 46.3 % (ref 38.4–49.9)
HEMOGLOBIN: 15.6 g/dL (ref 13.0–17.1)
LYMPH#: 1.8 10*3/uL (ref 0.9–3.3)
LYMPH%: 19 % (ref 14.0–49.0)
MCH: 26.2 pg — ABNORMAL LOW (ref 27.2–33.4)
MCHC: 33.7 g/dL (ref 32.0–36.0)
MCV: 77.8 fL — AB (ref 79.3–98.0)
MONO#: 0.6 10*3/uL (ref 0.1–0.9)
MONO%: 7 % (ref 0.0–14.0)
NEUT#: 6.5 10*3/uL (ref 1.5–6.5)
NEUT%: 71.1 % (ref 39.0–75.0)
NRBC: 0 % (ref 0–0)
PLATELETS: 17 10*3/uL — AB (ref 140–400)
RBC: 5.95 10*6/uL — ABNORMAL HIGH (ref 4.20–5.82)
RDW: 13.6 % (ref 11.0–14.6)
WBC: 9.2 10*3/uL (ref 4.0–10.3)

## 2015-09-01 MED ORDER — DIPHENHYDRAMINE HCL 25 MG PO CAPS
ORAL_CAPSULE | ORAL | Status: AC
  Filled 2015-09-01: qty 1

## 2015-09-01 MED ORDER — ACETAMINOPHEN 325 MG PO TABS
650.0000 mg | ORAL_TABLET | Freq: Once | ORAL | Status: AC
  Administered 2015-09-01: 650 mg via ORAL

## 2015-09-01 MED ORDER — MYCOPHENOLATE MOFETIL 500 MG PO TABS: 500.0000 mg | ORAL_TABLET | Freq: Two times a day (BID) | ORAL | Status: DC

## 2015-09-01 MED ORDER — ROMIPLOSTIM INJECTION 500 MCG
400.0000 ug | SUBCUTANEOUS | Status: DC
  Administered 2015-09-01: 400 ug via SUBCUTANEOUS
  Filled 2015-09-01: qty 0.8

## 2015-09-01 MED ORDER — ACETAMINOPHEN 325 MG PO TABS
ORAL_TABLET | ORAL | Status: AC
  Filled 2015-09-01: qty 2

## 2015-09-01 MED ORDER — DIPHENHYDRAMINE HCL 25 MG PO CAPS
25.0000 mg | ORAL_CAPSULE | Freq: Once | ORAL | Status: AC
  Administered 2015-09-01: 25 mg via ORAL

## 2015-09-01 NOTE — Telephone Encounter (Signed)
Spanish language interpreter, Raynelle Fanning, reports pt called her to report some new spots on his legs.  He can come in for lab at 11:30.  Dr. Bertis Ruddy made aware and pt added for lab appt and Raynelle Fanning will call pt to confirm.

## 2015-09-01 NOTE — Telephone Encounter (Signed)
Language Resource sent Spanish speaking interpreter for pt's transfusion appt.Marland Kitchen

## 2015-09-01 NOTE — Telephone Encounter (Signed)
Dr. Bertis Ruddy ordered;  1. Increase Cellcept to 500 mg BID 2.  Platelet Transfusion today 3.  N-Plate today and cancel lab/injection for Friday 9/9 and scheduled Lab only for Monday 9/12.    Instructions given to John Brennan, John Brennan.  She will give instructions to pt.Marland Kitchen He will wait in lobby for transfusion.  I went out to lobby and also informed him of time for transfusion here at 1:45 pm and gave him pager #[phone].  Called for Spanish Brennan for transfusion appt at [phone] and s/w Lana.  She will request Brennan but unsure if anyone available. She will call nurse back.

## 2015-09-01 NOTE — Patient Instructions (Signed)
Trombocitopenia (Thrombocytopenia)  La trombocitopenia es un trastorno en el que hay un nmero de plaquetas en la sangre que es anormalmente bajo. Las plaquetas tambin se denominan trombocitos. Son necesarias para la coagulacin de la [place].  CAUSAS La trombocitopenia tiene su origen en:   La disminucin en la produccin de plaquetas. Las causas pueden ser:  Anemia aplsica en la que la mdula sea deja de fabricar los componentes de la [place].  Cncer de la mdula sea.  Ciertos medicamentos, incluyendo la quimioterapia.  Infeccin de la mdula sea.  Consumo excesivo de alcohol.  El incremento en la destruccin de plaquetas. Las causas pueden ser:  Materials engineer enfermedades del sistema digestivo.  Uso de ciertos frmacos  Ciertos trastornos de Doctor, hospital de Risk manager.  Ciertos trastornos hereditarios.  Ciertos trastornos hemorrgicos.  Embarazo.  Tener un bazo agrandado (hiperesplenismo). En el hiperesplenismo, el bazo toma las plaquetas de la circulacin. Esto significa que las plaquetas no estn disponibles para intervenir en la coagulacin de la [place]. El bazo puede agrandarse por cirrosis u otras enfermedades. SNTOMAS Todos los sntomas de trombocitopenia son esencialmente un efecto secundario de un dficit en la coagulacin de la [place]. Entre ellos se incluyen:  Hemorragias anormales.  Hemorragias nasales.  Perodos menstruales abundantes.  Sangre en la orina o en la materia fecal.  Prpura: coloracin purprea de la piel producida por pequeas hemorragias de los vasos sanguneos cerca de la superficie de la piel.  Hematomas.  Una erupcin que puede ser petequial. Esto aparece como puntitos, manchas de color rojo violceo sobre la piel y las mucosas. La causa es un sangrado de los pequeos vasos sanguneos (capilares). DIAGNSTICO El Hydrologist un diagnstico basado en el examen e indicando estudios de [place]. En algunos casos se realiza un estudio  de la mdula sea para observar las clulas originales (megacariocitos) que son los que forman las plaquetas. TRATAMIENTO El tratamiento depende de la causa.  Podrn indicarle medicamentos para proteger las plaquetas y Automotive engineer que se destruyan.  En algunos casos se realiza la reposicin de plaquetas (transfusin) para Adult nurse.  A veces es necesario extirpar quirrgicamente el bazo. INSTRUCCIONES PARA EL CUIDADO DOMICILIARIO  Controle su piel y el interior de su boca para ver si hay hematomas o sangre, segn las indicaciones de su mdico.  Controle el esputo, la orina y las heces para ver si hay sangre, segn le indique su mdico.  No vuelva a las actividades que puedan causar bultos o hematomas hasta que su mdico lo autorice.  Tome precauciones extra para no cortarse cuando se rasure, use tijeras, agujas, cuchillos u otras herramientas.  Tome precauciones extra para no sufrir quemaduras al Banker.  Consulte a su mdico si puede beber alcohol.  Tome slo medicamentos de venta libre o recetados, segn las indicaciones del mdico.  Notifique a los profesionales de la salud, incluyendo dentistas u oftalmlogos acerca de su enfermedad. SOLICITE ATENCIN MDICA INMEDIATAMENTE SI:  Tiene una hemorragia activa en cualquier lugar del cuerpo.  Se le forman moretones o tiene sangrados sin causa aparente.  Observa sangre en el esputo, la orina o las heces. ASEGRESE DE QUE:   Comprende estas instrucciones.  Controlar su enfermedad.  Solicitar ayuda de inmediato si no mejora o si empeora. Document Released: 03/29/2009 Document Revised: 03/04/2012 [facility] Patient Information 2015 [place], [place]. This information is not intended to replace advice given to you by your health care provider. Make sure you discuss any questions you have with your health  care provider.  

## 2015-09-02 LAB — PREPARE PLATELET PHERESIS: UNIT DIVISION: 0

## 2015-09-03 ENCOUNTER — Ambulatory Visit: Payer: Self-pay

## 2015-09-03 ENCOUNTER — Other Ambulatory Visit: Payer: Self-pay

## 2015-09-06 ENCOUNTER — Telehealth: Payer: Self-pay | Admitting: Oncology

## 2015-09-06 ENCOUNTER — Other Ambulatory Visit (HOSPITAL_BASED_OUTPATIENT_CLINIC_OR_DEPARTMENT_OTHER): Payer: Self-pay

## 2015-09-06 ENCOUNTER — Other Ambulatory Visit: Payer: Self-pay | Admitting: *Deleted

## 2015-09-06 DIAGNOSIS — D696 Thrombocytopenia, unspecified: Secondary | ICD-10-CM

## 2015-09-06 DIAGNOSIS — D693 Immune thrombocytopenic purpura: Secondary | ICD-10-CM

## 2015-09-06 LAB — COMPREHENSIVE METABOLIC PANEL (CC13)
ALT: 25 U/L (ref 0–55)
AST: 21 U/L (ref 5–34)
Albumin: 4.3 g/dL (ref 3.5–5.0)
Alkaline Phosphatase: 62 U/L (ref 40–150)
Anion Gap: 7 mEq/L (ref 3–11)
BUN: 10.3 mg/dL (ref 7.0–26.0)
CHLORIDE: 106 meq/L (ref 98–109)
CO2: 29 meq/L (ref 22–29)
CREATININE: 1.2 mg/dL (ref 0.7–1.3)
Calcium: 9.8 mg/dL (ref 8.4–10.4)
EGFR: 82 mL/min/{1.73_m2} — ABNORMAL LOW (ref >90)
Glucose: 99 mg/dl (ref 70–140)
Potassium: 4 mEq/L (ref 3.5–5.1)
Sodium: 142 mEq/L (ref 136–145)
Total Bilirubin: 0.43 mg/dL (ref 0.20–1.20)
Total Protein: 7.5 g/dL (ref 6.4–8.3)

## 2015-09-06 LAB — CBC WITH DIFFERENTIAL/PLATELET
BASO%: 0.6 % (ref 0.0–2.0)
Basophils Absolute: 0.1 10*3/uL (ref 0.0–0.1)
EOS%: 2.9 % (ref 0.0–7.0)
Eosinophils Absolute: 0.3 10*3/uL (ref 0.0–0.5)
HCT: 46.5 % (ref 38.4–49.9)
HGB: 14.8 g/dL (ref 13.0–17.1)
LYMPH#: 1.7 10*3/uL (ref 0.9–3.3)
LYMPH%: 15.6 % (ref 14.0–49.0)
MCH: 24.9 pg — ABNORMAL LOW (ref 27.2–33.4)
MCHC: 31.9 g/dL — AB (ref 32.0–36.0)
MCV: 78.1 fL — ABNORMAL LOW (ref 79.3–98.0)
MONO#: 0.6 10*3/uL (ref 0.1–0.9)
MONO%: 5.3 % (ref 0.0–14.0)
NEUT#: 8.2 10*3/uL — ABNORMAL HIGH (ref 1.5–6.5)
NEUT%: 75.6 % — AB (ref 39.0–75.0)
Platelets: 88 10*3/uL — ABNORMAL LOW (ref 140–400)
RBC: 5.95 10*6/uL — ABNORMAL HIGH (ref 4.20–5.82)
RDW: 14.9 % — ABNORMAL HIGH (ref 11.0–14.6)
WBC: 10.9 10*3/uL — ABNORMAL HIGH (ref 4.0–10.3)

## 2015-09-06 MED ORDER — CLINDAMYCIN PHOSPHATE 1 % EX GEL: CUTANEOUS | Status: DC

## 2015-09-06 NOTE — Telephone Encounter (Signed)
Approached by interpreter, Delorise Royals, to see if the patient has an appointment with the [facility] today. Appointment for lab at 115 relayed to patient through interpreter.

## 2015-09-10 ENCOUNTER — Ambulatory Visit (HOSPITAL_BASED_OUTPATIENT_CLINIC_OR_DEPARTMENT_OTHER): Payer: Self-pay

## 2015-09-10 ENCOUNTER — Other Ambulatory Visit: Payer: Self-pay | Admitting: Hematology and Oncology

## 2015-09-10 ENCOUNTER — Telehealth: Payer: Self-pay | Admitting: Hematology and Oncology

## 2015-09-10 ENCOUNTER — Other Ambulatory Visit (HOSPITAL_BASED_OUTPATIENT_CLINIC_OR_DEPARTMENT_OTHER): Payer: Self-pay

## 2015-09-10 VITALS — BP 117/52 | HR 66 | Temp 98.2°F

## 2015-09-10 VITALS — BP 126/56 | HR 62 | Temp 98.0°F | Resp 18

## 2015-09-10 DIAGNOSIS — D693 Immune thrombocytopenic purpura: Secondary | ICD-10-CM

## 2015-09-10 DIAGNOSIS — D696 Thrombocytopenia, unspecified: Secondary | ICD-10-CM

## 2015-09-10 LAB — CBC WITH DIFFERENTIAL/PLATELET
BASO%: 0.7 % (ref 0.0–2.0)
BASOS ABS: 0.1 10*3/uL (ref 0.0–0.1)
EOS%: 3.3 % (ref 0.0–7.0)
Eosinophils Absolute: 0.3 10*3/uL (ref 0.0–0.5)
HCT: 45.6 % (ref 38.4–49.9)
HEMOGLOBIN: 15.2 g/dL (ref 13.0–17.1)
LYMPH%: 15 % (ref 14.0–49.0)
MCH: 25.9 pg — AB (ref 27.2–33.4)
MCHC: 33.3 g/dL (ref 32.0–36.0)
MCV: 77.8 fL — AB (ref 79.3–98.0)
MONO#: 0.5 10*3/uL (ref 0.1–0.9)
MONO%: 4.8 % (ref 0.0–14.0)
NEUT#: 7.7 10*3/uL — ABNORMAL HIGH (ref 1.5–6.5)
NEUT%: 76.2 % — AB (ref 39.0–75.0)
Platelets: 6 10*3/uL — CL (ref 140–400)
RBC: 5.86 10*6/uL — AB (ref 4.20–5.82)
RDW: 13.4 % (ref 11.0–14.6)
WBC: 10.1 10*3/uL (ref 4.0–10.3)
lymph#: 1.5 10*3/uL (ref 0.9–3.3)
nRBC: 0 % (ref 0–0)

## 2015-09-10 LAB — COMPREHENSIVE METABOLIC PANEL (CC13)
ALBUMIN: 4.4 g/dL (ref 3.5–5.0)
ALK PHOS: 61 U/L (ref 40–150)
ALT: 26 U/L (ref 0–55)
AST: 23 U/L (ref 5–34)
Anion Gap: 8 mEq/L (ref 3–11)
BUN: 11.8 mg/dL (ref 7.0–26.0)
CO2: 27 mEq/L (ref 22–29)
Calcium: 9.7 mg/dL (ref 8.4–10.4)
Chloride: 105 mEq/L (ref 98–109)
Creatinine: 0.9 mg/dL (ref 0.7–1.3)
GLUCOSE: 147 mg/dL — AB (ref 70–140)
POTASSIUM: 4 meq/L (ref 3.5–5.1)
SODIUM: 140 meq/L (ref 136–145)
Total Bilirubin: 0.68 mg/dL (ref 0.20–1.20)
Total Protein: 7.4 g/dL (ref 6.4–8.3)

## 2015-09-10 MED ORDER — ACETAMINOPHEN 325 MG PO TABS
ORAL_TABLET | ORAL | Status: AC
  Filled 2015-09-10: qty 2

## 2015-09-10 MED ORDER — DIPHENHYDRAMINE HCL 25 MG PO CAPS
ORAL_CAPSULE | ORAL | Status: AC
  Filled 2015-09-10: qty 1

## 2015-09-10 MED ORDER — DIPHENHYDRAMINE HCL 25 MG PO CAPS
25.0000 mg | ORAL_CAPSULE | Freq: Once | ORAL | Status: AC
  Administered 2015-09-10: 25 mg via ORAL

## 2015-09-10 MED ORDER — SODIUM CHLORIDE 0.9 % IV SOLN
250.0000 mL | Freq: Once | INTRAVENOUS | Status: AC
  Administered 2015-09-10: 250 mL via INTRAVENOUS

## 2015-09-10 MED ORDER — ACETAMINOPHEN 325 MG PO TABS
650.0000 mg | ORAL_TABLET | Freq: Once | ORAL | Status: AC
  Administered 2015-09-10: 650 mg via ORAL

## 2015-09-10 MED ORDER — ROMIPLOSTIM INJECTION 500 MCG
470.0000 ug | SUBCUTANEOUS | Status: DC
  Administered 2015-09-10: 470 ug via SUBCUTANEOUS
  Filled 2015-09-10: qty 0.94

## 2015-09-10 NOTE — Telephone Encounter (Signed)
per pof to sch pt appt-gave pt copy of avs °

## 2015-09-10 NOTE — Patient Instructions (Signed)
Informacin sobre la transfusin de plaquetas (Platelet Transfusion Information) Esta es informacin acerca de la transfusin de plaquetas. Las plaquetas son pequeas clulas producidas en la mdula sea y que se encuentran en la sangre. Cuando un vaso sanguneo se daa, las plaquetas fluyen hacia la zona lesionada para formar un cogulo. Este procedimiento hace comenzar el proceso de curacin. Cuando el nivel de plaquetas es muy bajo en su sangre, podr tener problemas de coagulacin. Esto puede deberse a:  Enfermedades  Trastornos en la sangre.  Quimioterapia para tratar el cncer. A menudo, muchas veces los niveles bajos de plaquetas no causan problemas.  Las plaquetas pueden durar entre 7 y 10 das. Si no se utilizan en una lesin el bazo o el hgado las rompen. Los sntomas de un bajo recuento de plaquetas incluyen:  Hemorragias nasales.  Encas que sangran.  Abundante sangrado menstrual.  Hematomas y pequeos puntos rojos en la piel.  Los pequeos puntos de la piel se denominan petequias.  Grandes hematomas(prpura).  La hemorragia puede ser ms seria si sucede en el cerebro o el intestino. Las transfusiones de plaquetas a menudo se utilizan para mantener un nivel aceptable. Es poco comn que se produzca una hemorragia grave debido a la baja cantidad de plaquetas. RIESGOS Y COMPLICACIONES  No son frecuentes los efectos secundarios graves de este tratamiento. Las reacciones menores pueden incluir:  Picazn  Erupciones  Temperaturas alta y escalofros. Existen medicamentos disponibles para detener las reacciones a las transfusiones. Comente con el profesional que lo asiste si tiene alguno de estos problemas.  Si recibe transfusiones de plaquetas de manera frecuente pueden ser menos efectivas. Esto se denomina ser refractario a la plaquetas. Esto no es frecuente. Puede suceder por causas inmunes y no inmunes. Las causas no inmunes incluyen:  Fiebre alta.  Algunos  medicamentos.  Bazo agrandado. Las causas inmunes se producen cuando su organismo detecta que las plaquetas no le son propias y fabrican anticuerpos en contra de ellas. Los anticuerpos destruyen las plaquetas rpidamente. An con la transfusin de plaquetas usted podra continuar con problemas de sangrado o hematomas. Comunquele a sus mdicos si esto le sucede. Hay otras medidas para tomar si esto le sucede. ANTES DEL PROCEDIMIENTO  El mdico controlar su nivel de plaquetas de forma regular.  Si el nivel es muy bajo ser necesario realizarle una transfusin.  Esto es ms importante antes de ciertos procedimiento con riesgo de hemorragia como una puncin espinal.  La transfusin de plaquetas reduce el riesgo de hemorragias durante o despus del procedimiento.  Excepto durante emergencias, la transfusin requiere un consentimiento escrito. Antes de extraer sangre de un donante, se tomar un historial completo para asegurarse que la persona no tiene antecedentes de enfermedades previas y no est presenta conductas sociales de riesgo. Son ejemplo de estas ltimas el consumo de drogas por va intravenosa o la actividad sexual con mltiples parejas. Esto podra llevar a que fueran utilizados tanto sangre como sus derivados estando infectados. Esto se realiza incluso adems del exhaustivo control para asegurarse de que la sangre es segura. Todos los productos sanguneos que se transfunden se estudian para asegurarse de que son compatibles con la persona que la recibe. Tambin se controla para descartar la presencia de infecciones. Actualmente la sangre de transfusin es ms segura que nunca antes. El riesgo de infeccin es muy bajo. PROCEDIMIENTO  Las plaquetas se almacenan en pequeas bolsas plsticas que se guardan a bajas temperaturas.  Cada bolsa se denomina unidad y a veces se administran dos. Se   administran a travs de una gua intravenosa por goteo durante aproximadamente media hora.  Por lo  general la sangre se obtiene de muchas personas para tener la cantidad suficiente para transfundir.  A veces, las plaquetas se obtienen de una sola persona. Esto se realiza mediante una mquina especial que separa las plaquetas de la sangre. Esta se denomina mquina de afresis. Las plaquetas obtenidas por esta va se denominan plaquetas por afresis. Las plaquetas por afresis reducen el riesgo de volverse sensible a las plaquetas. Esto disminuye las posibilidades de tener una reaccin a la transfusin.  Como lleva muy poco tiempo donar plaquetas, este tratamiento puede administrarse en un consultorio externo. Las plaquetas tambin pueden administrarse antes o despus de otros tratamientos. SOLICITE ATENCIN MDICA INMEDIATAMENTE SI: Aparecen algunos de los siguientes sntomas en las 12 horas o en los das siguientes:  Siente escalofros.  Sube la fiebre, con una temperatura superior a los 102 F (38.9 C)  Dolores de espalda o musculares  Las personas que lo rodean creen que usted no acta correctamente o lo ven confundido  Sangre al orinar o en la deposicin o hemorragia en cualquier parte del cuerpo.  Le falta la respiracin o presenta dificultades respiratorias.  Mareos.  Desmayos  Desarrolla un sarpullido o urticaria.  Disminuye la cantidad de orina, o sta toma un color oscuro o cambia hacia un tono rosado, rojo o marrn.  Dolor de cabeza intenso o rigidez en el cuello.  Se le forman hematomas fcilmente. Document Released: 03/29/2009 Document Revised: 03/04/2012 [employer] Patient Information 2015 [employer]. This information is not intended to replace advice given to you by your health care provider. Make sure you discuss any questions you have with your health care provider.   

## 2015-09-12 LAB — PREPARE PLATELET PHERESIS: UNIT DIVISION: 0

## 2015-09-13 ENCOUNTER — Encounter: Payer: Self-pay | Admitting: Hematology and Oncology

## 2015-09-13 ENCOUNTER — Other Ambulatory Visit: Payer: Self-pay | Admitting: Neurology

## 2015-09-13 ENCOUNTER — Other Ambulatory Visit (HOSPITAL_BASED_OUTPATIENT_CLINIC_OR_DEPARTMENT_OTHER): Payer: Self-pay

## 2015-09-13 ENCOUNTER — Ambulatory Visit (HOSPITAL_BASED_OUTPATIENT_CLINIC_OR_DEPARTMENT_OTHER): Payer: Self-pay

## 2015-09-13 ENCOUNTER — Ambulatory Visit (HOSPITAL_BASED_OUTPATIENT_CLINIC_OR_DEPARTMENT_OTHER): Payer: Self-pay | Admitting: Hematology and Oncology

## 2015-09-13 VITALS — BP 133/78 | HR 79 | Temp 98.3°F | Resp 18 | Ht 62.0 in | Wt 148.1 lb

## 2015-09-13 VITALS — BP 131/77 | HR 67 | Temp 98.1°F | Resp 16

## 2015-09-13 DIAGNOSIS — D696 Thrombocytopenia, unspecified: Secondary | ICD-10-CM

## 2015-09-13 DIAGNOSIS — D693 Immune thrombocytopenic purpura: Secondary | ICD-10-CM

## 2015-09-13 LAB — CBC WITH DIFFERENTIAL/PLATELET
BASO%: 1.2 % (ref 0.0–2.0)
BASOS ABS: 0.1 10*3/uL (ref 0.0–0.1)
EOS%: 3 % (ref 0.0–7.0)
Eosinophils Absolute: 0.3 10*3/uL (ref 0.0–0.5)
HEMATOCRIT: 46.9 % (ref 38.4–49.9)
HGB: 15.4 g/dL (ref 13.0–17.1)
LYMPH#: 1.6 10*3/uL (ref 0.9–3.3)
LYMPH%: 16.5 % (ref 14.0–49.0)
MCH: 25.4 pg — AB (ref 27.2–33.4)
MCHC: 32.9 g/dL (ref 32.0–36.0)
MCV: 77.3 fL — ABNORMAL LOW (ref 79.3–98.0)
MONO#: 0.6 10*3/uL (ref 0.1–0.9)
MONO%: 6.8 % (ref 0.0–14.0)
NEUT#: 6.9 10*3/uL — ABNORMAL HIGH (ref 1.5–6.5)
NEUT%: 72.5 % (ref 39.0–75.0)
Platelets: 11 10*3/uL — ABNORMAL LOW (ref 140–400)
RBC: 6.07 10*6/uL — AB (ref 4.20–5.82)
RDW: 14.7 % — ABNORMAL HIGH (ref 11.0–14.6)
WBC: 9.5 10*3/uL (ref 4.0–10.3)

## 2015-09-13 LAB — COMPREHENSIVE METABOLIC PANEL (CC13)
ALT: 38 U/L (ref 0–55)
ANION GAP: 8 meq/L (ref 3–11)
AST: 27 U/L (ref 5–34)
Albumin: 4.5 g/dL (ref 3.5–5.0)
Alkaline Phosphatase: 64 U/L (ref 40–150)
BUN: 12.7 mg/dL (ref 7.0–26.0)
CALCIUM: 9.7 mg/dL (ref 8.4–10.4)
CHLORIDE: 105 meq/L (ref 98–109)
CO2: 28 meq/L (ref 22–29)
Creatinine: 1 mg/dL (ref 0.7–1.3)
EGFR: >90 mL/min/{1.73_m2} (ref >90)
Glucose: 119 mg/dl (ref 70–140)
POTASSIUM: 3.9 meq/L (ref 3.5–5.1)
Sodium: 141 mEq/L (ref 136–145)
Total Bilirubin: 0.6 mg/dL (ref 0.20–1.20)
Total Protein: 7.8 g/dL (ref 6.4–8.3)

## 2015-09-13 MED ORDER — ACETAMINOPHEN 325 MG PO TABS
ORAL_TABLET | ORAL | Status: AC
  Filled 2015-09-13: qty 2

## 2015-09-13 MED ORDER — DIPHENHYDRAMINE HCL 25 MG PO CAPS
25.0000 mg | ORAL_CAPSULE | Freq: Once | ORAL | Status: AC
  Administered 2015-09-13: 25 mg via ORAL

## 2015-09-13 MED ORDER — ACETAMINOPHEN 325 MG PO TABS
650.0000 mg | ORAL_TABLET | Freq: Once | ORAL | Status: AC
  Administered 2015-09-13: 650 mg via ORAL

## 2015-09-13 MED ORDER — DIPHENHYDRAMINE HCL 25 MG PO CAPS
ORAL_CAPSULE | ORAL | Status: AC
  Filled 2015-09-13: qty 1

## 2015-09-13 MED ORDER — SODIUM CHLORIDE 0.9 % IV SOLN
250.0000 mL | Freq: Once | INTRAVENOUS | Status: AC
  Administered 2015-09-13: 250 mL via INTRAVENOUS

## 2015-09-13 NOTE — Patient Instructions (Signed)
Informacin sobre la transfusin de plaquetas (Platelet Transfusion Information) Esta es informacin acerca de la transfusin de plaquetas. Las plaquetas son pequeas clulas producidas en la mdula sea y que se encuentran en la sangre. Cuando un vaso sanguneo se daa, las plaquetas fluyen hacia la zona lesionada para formar un cogulo. Este procedimiento hace comenzar el proceso de curacin. Cuando el nivel de plaquetas es muy bajo en su sangre, podr tener problemas de coagulacin. Esto puede deberse a:  Enfermedades  Trastornos en la sangre.  Quimioterapia para tratar el cncer. A menudo, muchas veces los niveles bajos de plaquetas no causan problemas.  Las plaquetas pueden durar entre 7 y 10 das. Si no se utilizan en una lesin el bazo o el hgado las rompen. Los sntomas de un bajo recuento de plaquetas incluyen:  Hemorragias nasales.  Encas que sangran.  Abundante sangrado menstrual.  Hematomas y pequeos puntos rojos en la piel.  Los pequeos puntos de la piel se denominan petequias.  Grandes hematomas(prpura).  La hemorragia puede ser ms seria si sucede en el cerebro o el intestino. Las transfusiones de plaquetas a menudo se utilizan para mantener un nivel aceptable. Es poco comn que se produzca una hemorragia grave debido a la baja cantidad de plaquetas. RIESGOS Y COMPLICACIONES  No son frecuentes los efectos secundarios graves de este tratamiento. Las reacciones menores pueden incluir:  Picazn  Erupciones  Temperaturas alta y escalofros. Existen medicamentos disponibles para detener las reacciones a las transfusiones. Comente con el profesional que lo asiste si tiene alguno de estos problemas.  Si recibe transfusiones de plaquetas de manera frecuente pueden ser menos efectivas. Esto se denomina ser refractario a la plaquetas. Esto no es frecuente. Puede suceder por causas inmunes y no inmunes. Las causas no inmunes incluyen:  Fiebre alta.  Algunos  medicamentos.  Bazo agrandado. Las causas inmunes se producen cuando su organismo detecta que las plaquetas no le son propias y fabrican anticuerpos en contra de ellas. Los anticuerpos destruyen las plaquetas rpidamente. An con la transfusin de plaquetas usted podra continuar con problemas de sangrado o hematomas. Comunquele a sus mdicos si esto le sucede. Hay otras medidas para tomar si esto le sucede. ANTES DEL PROCEDIMIENTO  El mdico controlar su nivel de plaquetas de forma regular.  Si el nivel es muy bajo ser necesario realizarle una transfusin.  Esto es ms importante antes de ciertos procedimiento con riesgo de hemorragia como una puncin espinal.  La transfusin de plaquetas reduce el riesgo de hemorragias durante o despus del procedimiento.  Excepto durante emergencias, la transfusin requiere un consentimiento escrito. Antes de extraer sangre de un donante, se tomar un historial completo para asegurarse que la persona no tiene antecedentes de enfermedades previas y no est presenta conductas sociales de riesgo. Son ejemplo de estas ltimas el consumo de drogas por va intravenosa o la actividad sexual con mltiples parejas. Esto podra llevar a que fueran utilizados tanto sangre como sus derivados estando infectados. Esto se realiza incluso adems del exhaustivo control para asegurarse de que la sangre es segura. Todos los productos sanguneos que se transfunden se estudian para asegurarse de que son compatibles con la persona que la recibe. Tambin se controla para descartar la presencia de infecciones. Actualmente la sangre de transfusin es ms segura que nunca antes. El riesgo de infeccin es muy bajo. PROCEDIMIENTO  Las plaquetas se almacenan en pequeas bolsas plsticas que se guardan a bajas temperaturas.  Cada bolsa se denomina unidad y a veces se administran dos. Se   administran a travs de una gua intravenosa por goteo durante aproximadamente media hora.  Por lo  general la sangre se obtiene de muchas personas para tener la cantidad suficiente para transfundir.  A veces, las plaquetas se obtienen de una sola persona. Esto se realiza mediante una mquina especial que separa las plaquetas de la sangre. Esta se denomina mquina de afresis. Las plaquetas obtenidas por esta va se denominan plaquetas por afresis. Las plaquetas por afresis reducen el riesgo de volverse sensible a las plaquetas. Esto disminuye las posibilidades de tener una reaccin a la transfusin.  Como lleva muy poco tiempo donar plaquetas, este tratamiento puede administrarse en un consultorio externo. Las plaquetas tambin pueden administrarse antes o despus de otros tratamientos. SOLICITE ATENCIN MDICA INMEDIATAMENTE SI: Aparecen algunos de los siguientes sntomas en las 12 horas o en los das siguientes:  Siente escalofros.  Sube la fiebre, con una temperatura superior a los 102 F (38.9 C)  Dolores de espalda o musculares  Las personas que lo rodean creen que usted no acta correctamente o lo ven confundido  Sangre al orinar o en la deposicin o hemorragia en cualquier parte del cuerpo.  Le falta la respiracin o presenta dificultades respiratorias.  Mareos.  Desmayos  Desarrolla un sarpullido o urticaria.  Disminuye la cantidad de orina, o sta toma un color oscuro o cambia hacia un tono rosado, rojo o marrn.  Dolor de cabeza intenso o rigidez en el cuello.  Se le forman hematomas fcilmente. Document Released: 03/29/2009 Document Revised: 03/04/2012 [employer] Patient Information 2015 [employer]. This information is not intended to replace advice given to you by your health care provider. Make sure you discuss any questions you have with your health care provider.   

## 2015-09-13 NOTE — Progress Notes (Signed)
[facility] OFFICE PROGRESS NOTE  No PCP Per Patient SUMMARY OF HEMATOLOGIC HISTORY:  John Brennan 29 y.o. was recently diagnosed with severe ITP. He was recently hospitalized for similar reason of in April 2016, twice, and had received IVIG and corticosteroid therapy. Please see my initial consult note dated 03/26/2015 for further details. The patient was started on IVIG, dexamethasone, Solu-Medrol and prednisone. He is consider steroid refractory. On 04/23/2015, he is started on CellCept 250 mg twice a day From 04/30/2015 to 04/28/2015, he was readmitted to the hospital due to bleeding. Bone marrow aspirate and biopsy performed on 04/27/2015 confirmed diagnosis of ITP. He was started also in addition to all the above, Nplate injection weekly On 05/04/2015, he received his last platelet transfusion. Starting 05/07/2015, prednisone taper was initiated. On 05/31/2015, prednisone was discontinued and the patient was placed on CellCept along with Nplate  on 07/14/2015, CellCept was reduced to 500 mg twice a day along with weekly Nplate injection On 09/10/2015, he suffered from relapse due to recent infection. He started to become platelet transfusion dependent On 09/13/2015, he received further platelet transfusion along with increased dose CellCept 750 mg twice a day  INTERVAL HISTORY: John Brennan 29 y.o. male returns for further follow-up. Interpreter is present throughout. He stated he has been compliant taking his medication. Recently, he has contact with sick individuals and he came down with a cold recently. The patient denies any recent signs or symptoms of bleeding such as spontaneous epistaxis, hematuria or hematochezia. He also complained of intermittent abdominal pain that comes and goes. He denies change in appetite or bowel habits  I have reviewed the past medical history, past surgical history, social history and family history with the patient and they are  unchanged from previous note.  ALLERGIES:  has No Known Allergies.  MEDICATIONS:  Current Outpatient Prescriptions  Medication Sig Dispense Refill  . acetaminophen (TYLENOL) 325 MG tablet Take 2 tablets (650 mg total) by mouth every 6 (six) hours as needed for mild pain, fever or headache (or Fever >/= 101).    . clindamycin (CLINDAGEL) 1 % gel APPLY TO AFFECTED AREA TWICE DAILY 30 g 0  . hydrocortisone 1 % ointment Apply 1 application topically 2 (two) times daily. Large tube available 30 g 0  . mycophenolate (CELLCEPT) 500 MG tablet Take 1 tablet (500 mg total) by mouth 2 (two) times daily. 60 tablet 0   No current facility-administered medications for this visit.     REVIEW OF SYSTEMS:   Constitutional: Denies fevers, chills or night sweats Eyes: Denies blurriness of vision Ears, nose, mouth, throat, and face: Denies mucositis or sore throat Respiratory: Denies cough, dyspnea or wheezes Cardiovascular: Denies palpitation, chest discomfort or lower extremity swelling Gastrointestinal:  Denies nausea, heartburn or change in bowel habits Skin: Denies abnormal skin rashes Lymphatics: Denies new lymphadenopathy or easy bruising Neurological:Denies numbness, tingling or new weaknesses Behavioral/Psych: Mood is stable, no new changes  All other systems were reviewed with the patient and are negative.  PHYSICAL EXAMINATION: ECOG PERFORMANCE STATUS: 1 - Symptomatic but completely ambulatory  Filed Vitals:   09/13/15 1029  BP: 133/78  Pulse: 79  Temp: 98.3 F (36.8 C)  Resp: 18   Filed Weights   09/13/15 1029  Weight: 148 lb 1.6 oz (67.178 kg)    GENERAL:alert, no distress and comfortable SKIN: he has diffuse acne on his face. EYES: normal, Conjunctiva are pink and non-injected, sclera clear OROPHARYNX:no exudate, no erythema and lips, buccal mucosa,  and tongue normal  Musculoskeletal:no cyanosis of digits and no clubbing  NEURO: alert & oriented x 3 with fluent speech, no  focal motor/sensory deficits  LABORATORY DATA:  I have reviewed the data as listed Results for orders placed or performed in visit on 09/13/15 (from the past 48 hour(s))  Prepare Pheresed Platelets     Status: None (Preliminary result)   Collection Time: 09/13/15 11:30 AM  Result Value Ref Range   Unit Number [id number]    Blood Component Type PLTPH LI2 PAS    Unit division 00    Status of Unit ISSUED    Transfusion Status OK TO TRANSFUSE     Lab Results  Component Value Date   WBC 9.5 09/13/2015   HGB 15.4 09/13/2015   HCT 46.9 09/13/2015   MCV 77.3* 09/13/2015   PLT 11* 09/13/2015    ASSESSMENT & PLAN:  ITP (idiopathic thrombocytopenic purpura) Unfortunately, he had relapse from ITP and is now needing platelet transfusion. He had sick contact and was recently down with a cold. He stated he has been compliant with treatment. I will continue transfusion of platelets today and will continue to monitor his platelet count closely. I plan to increase Nplate and CellCept to 750 mg twice a day.. The patient continued to be distressed that he is not getting better. I discussed with him treatment options. Unfortunately, the patient is an illegal immigrant and does not have Orthoptist and insurance. Other options including Promacta which he would not be able to afford and we cannot help to apply for any assistance in payment due to his illegal status. I also doubt that any surgeon will take him to surgery due to lack of insurance. We have a long discussion today. If we maxed out the dose of Nplate and CellCept, I would have no other treatment options available for him. Long-term platelet transfusion is not an option. The patient needs to consider going back to his home country for long-term treatment  We discussed some of the risks, benefits, and alternatives of platelets transfusions. The patient is symptomatic from low platelet counts with bruising/bleeding/at high  risk of life-threatening bleeding and the platelet count is critically low.  Some of the side-effects to be expected including risks of transfusion reactions, chills, infection, syndrome of volume overload and risk of hospitalization from various reasons and the patient is willing to proceed and went ahead to sign consent today.   All questions were answered. The patient knows to call the clinic with any problems, questions or concerns. No barriers to learning was detected.  I spent 25 minutes counseling the patient face to face. The total time spent in the appointment was 30 minutes and more than 50% was on counseling.     Bertis Ruddy, NI, MD 9/19/20162:10 PM

## 2015-09-13 NOTE — Assessment & Plan Note (Signed)
Unfortunately, he had relapse from ITP and is now needing platelet transfusion. He had sick contact and was recently down with a cold. He stated he has been compliant with treatment. I will continue transfusion of platelets today and will continue to monitor his platelet count closely. I plan to increase Nplate and CellCept to 750 mg twice a day.. The patient continued to be distressed that he is not getting better. I discussed with him treatment options. Unfortunately, the patient is an illegal immigrant and does not have Orthoptist and insurance. Other options including Promacta which he would not be able to afford and we cannot help to apply for any assistance in payment due to his illegal status. I also doubt that any surgeon will take him to surgery due to lack of insurance. We have a long discussion today. If we maxed out the dose of Nplate and CellCept, I would have no other treatment options available for him. Long-term platelet transfusion is not an option. The patient needs to consider going back to his home [place] for long-term treatment

## 2015-09-14 LAB — PREPARE PLATELET PHERESIS: UNIT DIVISION: 0

## 2015-09-15 ENCOUNTER — Other Ambulatory Visit (HOSPITAL_BASED_OUTPATIENT_CLINIC_OR_DEPARTMENT_OTHER): Payer: Self-pay

## 2015-09-15 ENCOUNTER — Other Ambulatory Visit: Payer: Self-pay

## 2015-09-15 DIAGNOSIS — D693 Immune thrombocytopenic purpura: Secondary | ICD-10-CM

## 2015-09-15 DIAGNOSIS — D696 Thrombocytopenia, unspecified: Secondary | ICD-10-CM

## 2015-09-15 LAB — CBC WITH DIFFERENTIAL/PLATELET
BASO%: 0.6 % (ref 0.0–2.0)
Basophils Absolute: 0.1 10*3/uL (ref 0.0–0.1)
EOS%: 2.5 % (ref 0.0–7.0)
Eosinophils Absolute: 0.3 10*3/uL (ref 0.0–0.5)
HCT: 47.7 % (ref 38.4–49.9)
HEMOGLOBIN: 15.9 g/dL (ref 13.0–17.1)
LYMPH#: 1.8 10*3/uL (ref 0.9–3.3)
LYMPH%: 16.5 % (ref 14.0–49.0)
MCH: 26 pg — AB (ref 27.2–33.4)
MCHC: 33.3 g/dL (ref 32.0–36.0)
MCV: 77.9 fL — ABNORMAL LOW (ref 79.3–98.0)
MONO#: 0.7 10*3/uL (ref 0.1–0.9)
MONO%: 6.9 % (ref 0.0–14.0)
NEUT#: 7.8 10*3/uL — ABNORMAL HIGH (ref 1.5–6.5)
NEUT%: 73.5 % (ref 39.0–75.0)
NRBC: 0 % (ref 0–0)
Platelets: 30 10*3/uL — ABNORMAL LOW (ref 140–400)
RBC: 6.12 10*6/uL — AB (ref 4.20–5.82)
RDW: 13.5 % (ref 11.0–14.6)
WBC: 10.6 10*3/uL — ABNORMAL HIGH (ref 4.0–10.3)

## 2015-09-17 ENCOUNTER — Other Ambulatory Visit (HOSPITAL_BASED_OUTPATIENT_CLINIC_OR_DEPARTMENT_OTHER): Payer: Self-pay

## 2015-09-17 ENCOUNTER — Ambulatory Visit (HOSPITAL_BASED_OUTPATIENT_CLINIC_OR_DEPARTMENT_OTHER): Payer: Self-pay

## 2015-09-17 VITALS — BP 132/84 | HR 75 | Temp 98.2°F

## 2015-09-17 DIAGNOSIS — D696 Thrombocytopenia, unspecified: Secondary | ICD-10-CM

## 2015-09-17 DIAGNOSIS — D693 Immune thrombocytopenic purpura: Secondary | ICD-10-CM

## 2015-09-17 LAB — CBC WITH DIFFERENTIAL/PLATELET
BASO%: 0.8 % (ref 0.0–2.0)
Basophils Absolute: 0.1 10*3/uL (ref 0.0–0.1)
EOS%: 2.4 % (ref 0.0–7.0)
Eosinophils Absolute: 0.3 10*3/uL (ref 0.0–0.5)
HCT: 47.1 % (ref 38.4–49.9)
HEMOGLOBIN: 15.2 g/dL (ref 13.0–17.1)
LYMPH#: 1.9 10*3/uL (ref 0.9–3.3)
LYMPH%: 14.6 % (ref 14.0–49.0)
MCH: 24.8 pg — AB (ref 27.2–33.4)
MCHC: 32.3 g/dL (ref 32.0–36.0)
MCV: 76.8 fL — ABNORMAL LOW (ref 79.3–98.0)
MONO#: 0.8 10*3/uL (ref 0.1–0.9)
MONO%: 6 % (ref 0.0–14.0)
NEUT%: 76.2 % — ABNORMAL HIGH (ref 39.0–75.0)
NEUTROS ABS: 10 10*3/uL — AB (ref 1.5–6.5)
Platelets: 89 10*3/uL — ABNORMAL LOW (ref 140–400)
RBC: 6.13 10*6/uL — ABNORMAL HIGH (ref 4.20–5.82)
RDW: 14.1 % (ref 11.0–14.6)
WBC: 13.1 10*3/uL — AB (ref 4.0–10.3)

## 2015-09-17 LAB — COMPREHENSIVE METABOLIC PANEL (CC13)
ALBUMIN: 4.5 g/dL (ref 3.5–5.0)
ALK PHOS: 67 U/L (ref 40–150)
ALT: 36 U/L (ref 0–55)
AST: 23 U/L (ref 5–34)
Anion Gap: 9 mEq/L (ref 3–11)
BUN: 13.5 mg/dL (ref 7.0–26.0)
CO2: 29 mEq/L (ref 22–29)
CREATININE: 1.2 mg/dL (ref 0.7–1.3)
Calcium: 9.9 mg/dL (ref 8.4–10.4)
Chloride: 102 mEq/L (ref 98–109)
EGFR: 87 mL/min/{1.73_m2} — ABNORMAL LOW (ref >90)
GLUCOSE: 105 mg/dL (ref 70–140)
POTASSIUM: 4.2 meq/L (ref 3.5–5.1)
SODIUM: 140 meq/L (ref 136–145)
TOTAL PROTEIN: 7.8 g/dL (ref 6.4–8.3)
Total Bilirubin: 0.48 mg/dL (ref 0.20–1.20)

## 2015-09-17 MED ORDER — CLINDAMYCIN PHOSPHATE 1 % EX GEL: CUTANEOUS | Status: DC

## 2015-09-17 MED ORDER — ROMIPLOSTIM INJECTION 500 MCG
7.0000 ug/kg | SUBCUTANEOUS | Status: DC
  Administered 2015-09-17: 470 ug via SUBCUTANEOUS
  Filled 2015-09-17: qty 0.94

## 2015-09-17 MED ORDER — ROMIPLOSTIM 250 MCG ~~LOC~~ SOLR
3.0000 ug/kg | SUBCUTANEOUS | Status: DC
  Filled 2015-09-17: qty 0.4

## 2015-09-24 ENCOUNTER — Other Ambulatory Visit (HOSPITAL_BASED_OUTPATIENT_CLINIC_OR_DEPARTMENT_OTHER): Payer: Self-pay

## 2015-09-24 ENCOUNTER — Ambulatory Visit: Payer: Self-pay | Admitting: Internal Medicine

## 2015-09-24 ENCOUNTER — Ambulatory Visit (HOSPITAL_BASED_OUTPATIENT_CLINIC_OR_DEPARTMENT_OTHER): Payer: Self-pay

## 2015-09-24 DIAGNOSIS — D696 Thrombocytopenia, unspecified: Secondary | ICD-10-CM

## 2015-09-24 DIAGNOSIS — D693 Immune thrombocytopenic purpura: Secondary | ICD-10-CM

## 2015-09-24 LAB — CBC WITH DIFFERENTIAL/PLATELET
BASO%: 0.8 % (ref 0.0–2.0)
Basophils Absolute: 0.1 10*3/uL (ref 0.0–0.1)
EOS%: 3.5 % (ref 0.0–7.0)
Eosinophils Absolute: 0.4 10*3/uL (ref 0.0–0.5)
HCT: 47 % (ref 38.4–49.9)
HGB: 15.4 g/dL (ref 13.0–17.1)
LYMPH%: 19.1 % (ref 14.0–49.0)
MCH: 25.7 pg — ABNORMAL LOW (ref 27.2–33.4)
MCHC: 32.8 g/dL (ref 32.0–36.0)
MCV: 78.5 fL — ABNORMAL LOW (ref 79.3–98.0)
MONO#: 0.7 10*3/uL (ref 0.1–0.9)
MONO%: 6.1 % (ref 0.0–14.0)
NEUT%: 70.5 % (ref 39.0–75.0)
NEUTROS ABS: 8.1 10*3/uL — AB (ref 1.5–6.5)
Platelets: 96 10*3/uL — ABNORMAL LOW (ref 140–400)
RBC: 5.99 10*6/uL — AB (ref 4.20–5.82)
RDW: 13.4 % (ref 11.0–14.6)
WBC: 11.4 10*3/uL — AB (ref 4.0–10.3)
lymph#: 2.2 10*3/uL (ref 0.9–3.3)
nRBC: 0 % (ref 0–0)

## 2015-09-24 LAB — COMPREHENSIVE METABOLIC PANEL (CC13)
ALT: 38 U/L (ref 0–55)
AST: 23 U/L (ref 5–34)
Albumin: 4.5 g/dL (ref 3.5–5.0)
Alkaline Phosphatase: 63 U/L (ref 40–150)
Anion Gap: 8 mEq/L (ref 3–11)
BUN: 9.4 mg/dL (ref 7.0–26.0)
CHLORIDE: 105 meq/L (ref 98–109)
CO2: 28 meq/L (ref 22–29)
CREATININE: 0.9 mg/dL (ref 0.7–1.3)
Calcium: 9.8 mg/dL (ref 8.4–10.4)
EGFR: >90 mL/min/{1.73_m2} (ref >90)
Glucose: 117 mg/dl (ref 70–140)
Potassium: 4.1 mEq/L (ref 3.5–5.1)
Sodium: 141 mEq/L (ref 136–145)
Total Bilirubin: 0.39 mg/dL (ref 0.20–1.20)
Total Protein: 7.8 g/dL (ref 6.4–8.3)

## 2015-09-24 MED ORDER — ROMIPLOSTIM 250 MCG ~~LOC~~ SOLR
3.0000 ug/kg | SUBCUTANEOUS | Status: DC
  Filled 2015-09-24: qty 0.4

## 2015-09-24 MED ORDER — ROMIPLOSTIM INJECTION 500 MCG
470.0000 ug | SUBCUTANEOUS | Status: DC
  Administered 2015-09-24: 470 ug via SUBCUTANEOUS
  Filled 2015-09-24: qty 0.94

## 2015-10-01 ENCOUNTER — Other Ambulatory Visit (HOSPITAL_BASED_OUTPATIENT_CLINIC_OR_DEPARTMENT_OTHER): Payer: Self-pay

## 2015-10-01 ENCOUNTER — Telehealth: Payer: Self-pay | Admitting: *Deleted

## 2015-10-01 ENCOUNTER — Ambulatory Visit (HOSPITAL_BASED_OUTPATIENT_CLINIC_OR_DEPARTMENT_OTHER): Payer: Self-pay

## 2015-10-01 VITALS — BP 130/70 | HR 77 | Temp 98.8°F | Resp 18

## 2015-10-01 DIAGNOSIS — D696 Thrombocytopenia, unspecified: Secondary | ICD-10-CM

## 2015-10-01 DIAGNOSIS — D693 Immune thrombocytopenic purpura: Secondary | ICD-10-CM

## 2015-10-01 LAB — CBC WITH DIFFERENTIAL/PLATELET
BASO%: 0.8 % (ref 0.0–2.0)
Basophils Absolute: 0.1 10*3/uL (ref 0.0–0.1)
EOS ABS: 0.5 10*3/uL (ref 0.0–0.5)
EOS%: 3.5 % (ref 0.0–7.0)
HCT: 45.6 % (ref 38.4–49.9)
HEMOGLOBIN: 14.8 g/dL (ref 13.0–17.1)
LYMPH%: 18.2 % (ref 14.0–49.0)
MCH: 24.9 pg — ABNORMAL LOW (ref 27.2–33.4)
MCHC: 32.4 g/dL (ref 32.0–36.0)
MCV: 76.8 fL — ABNORMAL LOW (ref 79.3–98.0)
MONO#: 0.9 10*3/uL (ref 0.1–0.9)
MONO%: 6.8 % (ref 0.0–14.0)
NEUT%: 70.7 % (ref 39.0–75.0)
NEUTROS ABS: 9.3 10*3/uL — AB (ref 1.5–6.5)
Platelets: 256 10*3/uL (ref 140–400)
RBC: 5.93 10*6/uL — ABNORMAL HIGH (ref 4.20–5.82)
RDW: 14 % (ref 11.0–14.6)
WBC: 13.1 10*3/uL — AB (ref 4.0–10.3)
lymph#: 2.4 10*3/uL (ref 0.9–3.3)

## 2015-10-01 LAB — COMPREHENSIVE METABOLIC PANEL (CC13)
ALBUMIN: 4.5 g/dL (ref 3.5–5.0)
ALK PHOS: 67 U/L (ref 40–150)
ALT: 38 U/L (ref 0–55)
AST: 30 U/L (ref 5–34)
Anion Gap: 9 mEq/L (ref 3–11)
BILIRUBIN TOTAL: 0.46 mg/dL (ref 0.20–1.20)
BUN: 11.8 mg/dL (ref 7.0–26.0)
CO2: 27 meq/L (ref 22–29)
Calcium: 9.6 mg/dL (ref 8.4–10.4)
Chloride: 105 mEq/L (ref 98–109)
Creatinine: 0.8 mg/dL (ref 0.7–1.3)
EGFR: >90 mL/min/{1.73_m2} (ref >90)
GLUCOSE: 112 mg/dL (ref 70–140)
Potassium: 3.7 mEq/L (ref 3.5–5.1)
SODIUM: 141 meq/L (ref 136–145)
TOTAL PROTEIN: 7.8 g/dL (ref 6.4–8.3)

## 2015-10-01 MED ORDER — ROMIPLOSTIM INJECTION 500 MCG
470.0000 ug | SUBCUTANEOUS | Status: DC
  Administered 2015-10-01: 470 ug via SUBCUTANEOUS
  Filled 2015-10-01: qty 0.94

## 2015-10-01 NOTE — Telephone Encounter (Signed)
Used Interpreter service to leave message below

## 2015-10-01 NOTE — Patient Instructions (Signed)
Informacin sobre la transfusin de plaquetas (Platelet Transfusion Information) Esta es informacin acerca de la transfusin de plaquetas. Las plaquetas son pequeas clulas producidas en la mdula sea y que se encuentran en la sangre. Cuando un vaso sanguneo se daa, las plaquetas fluyen hacia la zona lesionada para formar un cogulo. Este procedimiento hace comenzar el proceso de curacin. Cuando el nivel de plaquetas es muy bajo en su sangre, podr tener problemas de coagulacin. Esto puede deberse a:  Enfermedades  Trastornos en la sangre.  Quimioterapia para tratar el cncer. A menudo, muchas veces los niveles bajos de plaquetas no causan problemas.  Las plaquetas pueden durar entre 7 y 10 das. Si no se utilizan en una lesin el bazo o el hgado las rompen. Los sntomas de un bajo recuento de plaquetas incluyen:  Hemorragias nasales.  Encas que sangran.  Abundante sangrado menstrual.  Hematomas y pequeos puntos rojos en la piel.  Los pequeos puntos de la piel se denominan petequias.  Grandes hematomas(prpura).  La hemorragia puede ser ms seria si sucede en el cerebro o el intestino. Las transfusiones de plaquetas a menudo se utilizan para mantener un nivel aceptable. Es poco comn que se produzca una hemorragia grave debido a la baja cantidad de plaquetas. RIESGOS Y COMPLICACIONES  No son frecuentes los efectos secundarios graves de este tratamiento. Las reacciones menores pueden incluir:  Picazn  Erupciones  Temperaturas alta y escalofros. Existen medicamentos disponibles para detener las reacciones a las transfusiones. Comente con el profesional que lo asiste si tiene alguno de estos problemas.  Si recibe transfusiones de plaquetas de manera frecuente pueden ser menos efectivas. Esto se denomina ser refractario a la plaquetas. Esto no es frecuente. Puede suceder por causas inmunes y no inmunes. Las causas no inmunes incluyen:  Fiebre alta.  Algunos  medicamentos.  Bazo agrandado. Las causas inmunes se producen cuando su organismo detecta que las plaquetas no le son propias y fabrican anticuerpos en contra de ellas. Los anticuerpos destruyen las plaquetas rpidamente. An con la transfusin de plaquetas usted podra continuar con problemas de sangrado o hematomas. Comunquele a sus mdicos si esto le sucede. Hay otras medidas para tomar si esto le sucede. ANTES DEL PROCEDIMIENTO  El mdico controlar su nivel de plaquetas de forma regular.  Si el nivel es muy bajo ser necesario realizarle una transfusin.  Esto es ms importante antes de ciertos procedimiento con riesgo de hemorragia como una puncin espinal.  La transfusin de plaquetas reduce el riesgo de hemorragias durante o despus del procedimiento.  Excepto durante emergencias, la transfusin requiere un consentimiento escrito. Antes de extraer sangre de un donante, se tomar un historial completo para asegurarse que la persona no tiene antecedentes de enfermedades previas y no est presenta conductas sociales de riesgo. Son ejemplo de estas ltimas el consumo de drogas por va intravenosa o la actividad sexual con mltiples parejas. Esto podra llevar a que fueran utilizados tanto sangre como sus derivados estando infectados. Esto se realiza incluso adems del exhaustivo control para asegurarse de que la sangre es segura. Todos los productos sanguneos que se transfunden se estudian para asegurarse de que son compatibles con la persona que la recibe. Tambin se controla para descartar la presencia de infecciones. Actualmente la sangre de transfusin es ms segura que nunca antes. El riesgo de infeccin es muy bajo. PROCEDIMIENTO  Las plaquetas se almacenan en pequeas bolsas plsticas que se guardan a bajas temperaturas.  Cada bolsa se denomina unidad y a veces se administran dos. Se   administran a travs de una gua intravenosa por goteo durante aproximadamente media hora.  Por lo  general la sangre se obtiene de muchas personas para tener la cantidad suficiente para transfundir.  A veces, las plaquetas se obtienen de una sola persona. Esto se realiza mediante una mquina especial que separa las plaquetas de la sangre. Esta se denomina mquina de afresis. Las plaquetas obtenidas por esta va se denominan plaquetas por afresis. Las plaquetas por afresis reducen el riesgo de volverse sensible a las plaquetas. Esto disminuye las posibilidades de tener una reaccin a la transfusin.  Como lleva muy poco tiempo donar plaquetas, este tratamiento puede administrarse en un consultorio externo. Las plaquetas tambin pueden administrarse antes o despus de otros tratamientos. SOLICITE ATENCIN MDICA INMEDIATAMENTE SI: Aparecen algunos de los siguientes sntomas en las 12 horas o en los das siguientes:  Siente escalofros.  Sube la fiebre, con una temperatura superior a los 102 F (38.9 C)  Dolores de espalda o musculares  Las personas que lo rodean creen que usted no acta correctamente o lo ven confundido  Sangre al orinar o en la deposicin o hemorragia en cualquier parte del cuerpo.  Le falta la respiracin o presenta dificultades respiratorias.  Mareos.  Desmayos  Desarrolla un sarpullido o urticaria.  Disminuye la cantidad de orina, o sta toma un color oscuro o cambia hacia un tono rosado, rojo o marrn.  Dolor de cabeza intenso o rigidez en el cuello.  Se le forman hematomas fcilmente. Document Released: 03/29/2009 Document Revised: 03/04/2012 [employer] Patient Information 2015 [employer]. This information is not intended to replace advice given to you by your health care provider. Make sure you discuss any questions you have with your health care provider.   

## 2015-10-01 NOTE — Telephone Encounter (Signed)
-----   Message from Artis Delay, MD sent at 10/01/2015  3:59 PM EDT ----- Regarding: call pt No change in Rx. Same dose cellcept ----- Message -----    From: [facility] Interface    Sent: 10/01/2015   1:30 PM      To: Artis Delay, MD

## 2015-10-08 ENCOUNTER — Ambulatory Visit (HOSPITAL_BASED_OUTPATIENT_CLINIC_OR_DEPARTMENT_OTHER): Payer: Self-pay | Admitting: Hematology and Oncology

## 2015-10-08 ENCOUNTER — Encounter: Payer: Self-pay | Admitting: Hematology and Oncology

## 2015-10-08 ENCOUNTER — Other Ambulatory Visit (HOSPITAL_BASED_OUTPATIENT_CLINIC_OR_DEPARTMENT_OTHER): Payer: Self-pay

## 2015-10-08 ENCOUNTER — Ambulatory Visit: Payer: Self-pay

## 2015-10-08 ENCOUNTER — Telehealth: Payer: Self-pay | Admitting: Hematology and Oncology

## 2015-10-08 VITALS — BP 131/82 | HR 100 | Temp 99.1°F | Resp 17 | Ht 62.0 in | Wt 150.0 lb

## 2015-10-08 DIAGNOSIS — L270 Generalized skin eruption due to drugs and medicaments taken internally: Secondary | ICD-10-CM

## 2015-10-08 DIAGNOSIS — D693 Immune thrombocytopenic purpura: Secondary | ICD-10-CM

## 2015-10-08 DIAGNOSIS — D696 Thrombocytopenia, unspecified: Secondary | ICD-10-CM

## 2015-10-08 LAB — COMPREHENSIVE METABOLIC PANEL (CC13)
ALBUMIN: 4.3 g/dL (ref 3.5–5.0)
ALK PHOS: 64 U/L (ref 40–150)
ALT: 36 U/L (ref 0–55)
AST: 25 U/L (ref 5–34)
Anion Gap: 8 mEq/L (ref 3–11)
BILIRUBIN TOTAL: 0.54 mg/dL (ref 0.20–1.20)
BUN: 13.4 mg/dL (ref 7.0–26.0)
CO2: 27 mEq/L (ref 22–29)
Calcium: 8.7 mg/dL (ref 8.4–10.4)
Chloride: 106 mEq/L (ref 98–109)
Creatinine: 1.1 mg/dL (ref 0.7–1.3)
GLUCOSE: 90 mg/dL (ref 70–140)
Potassium: 4 mEq/L (ref 3.5–5.1)
SODIUM: 140 meq/L (ref 136–145)
TOTAL PROTEIN: 7.4 g/dL (ref 6.4–8.3)

## 2015-10-08 LAB — CBC WITH DIFFERENTIAL/PLATELET
BASO%: 1 % (ref 0.0–2.0)
Basophils Absolute: 0.1 10*3/uL (ref 0.0–0.1)
EOS%: 3 % (ref 0.0–7.0)
Eosinophils Absolute: 0.4 10*3/uL (ref 0.0–0.5)
HCT: 45.5 % (ref 38.4–49.9)
HGB: 14.5 g/dL (ref 13.0–17.1)
LYMPH#: 1.9 10*3/uL (ref 0.9–3.3)
LYMPH%: 15.2 % (ref 14.0–49.0)
MCH: 24.9 pg — ABNORMAL LOW (ref 27.2–33.4)
MCHC: 31.9 g/dL — AB (ref 32.0–36.0)
MCV: 77.8 fL — ABNORMAL LOW (ref 79.3–98.0)
MONO#: 0.6 10*3/uL (ref 0.1–0.9)
MONO%: 5.3 % (ref 0.0–14.0)
NEUT%: 75.5 % — AB (ref 39.0–75.0)
NEUTROS ABS: 9.2 10*3/uL — AB (ref 1.5–6.5)
Platelets: 94 10*3/uL — ABNORMAL LOW (ref 140–400)
RBC: 5.85 10*6/uL — AB (ref 4.20–5.82)
RDW: 13.8 % (ref 11.0–14.6)
WBC: 12.2 10*3/uL — AB (ref 4.0–10.3)

## 2015-10-08 NOTE — Progress Notes (Signed)
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 He was recently hospitalized for similar reason of in April 2016, twice, and had received IVIG and corticosteroid therapy. Please see my initial consult note dated 03/26/2015 for further details. The patient was started on IVIG, dexamethasone, Solu-Medrol and prednisone. He is consider steroid refractory. On 04/23/2015, he is started on CellCept 250 mg twice a day From 04/30/2015 to 04/28/2015, he was readmitted to the hospital due to bleeding. Bone marrow aspirate and biopsy performed on 04/27/2015 confirmed diagnosis of ITP. He was started also in addition to all the above, Nplate injection weekly On 05/04/2015, he received his last platelet transfusion. Starting 05/07/2015, prednisone taper was initiated. On 05/31/2015, prednisone was discontinued and the patient was placed on CellCept along with Nplate  on 65/68/1275, CellCept was reduced to 500 mg twice a day along with weekly Nplate injection On 17/00/1749, he suffered from relapse due to recent infection. 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 male returns for further follow-up. He feels well. He continues of mild acne but it does not bother him. The patient denies any recent signs or symptoms of bleeding such as spontaneous epistaxis, hematuria or hematochezia.   I have reviewed the past medical history, past surgical history, social history and family history with the patient and they are unchanged from previous note.  ALLERGIES:  has No Known Allergies.  MEDICATIONS:  Current Outpatient Prescriptions  Medication Sig Dispense Refill  .  acetaminophen (TYLENOL) 325 MG tablet Take 2 tablets (650 mg total) by mouth every 6 (six) hours as needed for mild pain, fever or headache (or Fever >/= 101).    . clindamycin (CLINDAGEL) 1 % gel APPLY TO AFFECTED AREA TWICE DAILY 30 g 0  . hydrocortisone 1 % ointment Apply 1 application topically 2 (two) times daily. Large tube available 30 g 0  . mycophenolate (CELLCEPT) 250 MG capsule Take 250 mg by mouth 2 (two) times daily. Take w/ 500 mg daily to equal 750 mg twice daily.    . mycophenolate (CELLCEPT) 500 MG tablet Take 1 tablet (500 mg total) by mouth 2 (two) times daily. (Patient taking differently: Take 500 mg by mouth 2 (two) times daily. W/ 250 mg to equal 750 mg twice daily.) 60 tablet 0   No current facility-administered medications for this visit.     REVIEW OF SYSTEMS:   Constitutional: Denies fevers, chills or night sweats Eyes: Denies blurriness of vision Ears, nose, mouth, throat, and face: Denies mucositis or sore throat Respiratory: Denies cough, dyspnea or wheezes Cardiovascular: Denies palpitation, chest discomfort or lower extremity swelling Gastrointestinal:  Denies nausea, heartburn or change in bowel habits Skin: Denies abnormal skin rashes Lymphatics: Denies new lymphadenopathy or easy bruising Neurological:Denies numbness, tingling or new weaknesses Behavioral/Psych: Mood is stable, no new changes  All other systems were reviewed with the patient and are negative.  PHYSICAL EXAMINATION: ECOG PERFORMANCE STATUS: 0 - Asymptomatic  Filed Vitals:   10/08/15 1438  BP: 131/82  Pulse: 100  Temp: 99.1 F (37.3 C)  Resp: 17   Filed Weights   10/08/15 1438  Weight: 150 lb (68.04 kg)    GENERAL:alert, no distress and comfortable SKIN: skin color, texture, turgor are  normal, no rashes or significant lesions. Noted acne rash EYES: normal, Conjunctiva are pink and non-injected, sclera clear Musculoskeletal:no cyanosis of digits and no clubbing  NEURO: alert &  oriented x 3 with fluent speech, no focal motor/sensory deficits  LABORATORY DATA:  I have reviewed the data as listed Results for orders placed or performed in visit on 10/08/15 (from the past 48 hour(s))  CBC with Differential/Platelet     Status: Abnormal   Collection Time: 10/08/15  2:15 PM  Result Value Ref Range   WBC 12.2 (H) 4.0 - 10.3 10e3/uL   NEUT# 9.2 (H) 1.5 - 6.5 10e3/uL   HGB 14.5 13.0 - 17.1 g/dL   HCT 45.5 38.4 - 49.9 %   Platelets 94 (L) 140 - 400 10e3/uL   MCV 77.8 (L) 79.3 - 98.0 fL   MCH 24.9 (L) 27.2 - 33.4 pg   MCHC 31.9 (L) 32.0 - 36.0 g/dL   RBC 5.85 (H) 4.20 - 5.82 10e6/uL   RDW 13.8 11.0 - 14.6 %   lymph# 1.9 0.9 - 3.3 10e3/uL   MONO# 0.6 0.1 - 0.9 10e3/uL   Eosinophils Absolute 0.4 0.0 - 0.5 10e3/uL   Basophils Absolute 0.1 0.0 - 0.1 10e3/uL   NEUT% 75.5 (H) 39.0 - 75.0 %   LYMPH% 15.2 14.0 - 49.0 %   MONO% 5.3 0.0 - 14.0 %   EOS% 3.0 0.0 - 7.0 %   BASO% 1.0 0.0 - 2.0 %  Comprehensive metabolic panel     Status: None   Collection Time: 10/08/15  2:15 PM  Result Value Ref Range   Sodium 140 136 - 145 mEq/L   Potassium 4.0 3.5 - 5.1 mEq/L   Chloride 106 98 - 109 mEq/L   CO2 27 22 - 29 mEq/L   Glucose 90 70 - 140 mg/dl    Comment: Glucose reference range is for nonfasting patients. Fasting glucose reference range is 70- 100.   BUN 13.4 7.0 - 26.0 mg/dL   Creatinine 1.1 0.7 - 1.3 mg/dL   Total Bilirubin 0.54 0.20 - 1.20 mg/dL   Alkaline Phosphatase 64 40 - 150 U/L   AST 25 5 - 34 U/L   ALT 36 0 - 55 U/L   Total Protein 7.4 6.4 - 8.3 g/dL   Albumin 4.3 3.5 - 5.0 g/dL   Calcium 8.7 8.4 - 10.4 mg/dL   Anion Gap 8 3 - 11 mEq/L   EGFR >90 >90 ml/min/1.73 m2    Comment: eGFR is calculated using the CKD-EPI Creatinine Equation (2009)    Lab Results  Component Value Date   WBC 12.2* 10/08/2015   HGB 14.5 10/08/2015   HCT 45.5 10/08/2015   MCV 77.8* 10/08/2015   PLT 94* 10/08/2015  ASSESSMENT & PLAN:  ITP (idiopathic thrombocytopenic  purpura) His most recent drop in platelet count coincide with an illness. Since then, his platelet count has now recovered greater than 50,000. We discussed long-term management. I recommend discontinuation of Nplate and to stay on CellCept only. He will continue weekly blood draw. If his platelet count remained stable, I plan to slowly reduce the dose of CellCept in the future. He agreed with the plan of care.  Drug-induced skin rash  He has skin reaction likely due to excessive sun exposure while on Cellcept. He is on hydrocortisone cream and clindamycin. This is improving. I recommend we continue the same.       All questions were answered. The patient knows to call the clinic with  any problems, questions or concerns. No barriers to learning was detected.  I spent 15 minutes counseling the patient face to face. The total time spent in the appointment was 20 minutes and more than 50% was on counseling.     [facility], Ledon Weihe, MD 10/14/20163:19 PM

## 2015-10-08 NOTE — Telephone Encounter (Signed)
per pof tos sch pt appt-gave pt copy of avs °

## 2015-10-08 NOTE — Assessment & Plan Note (Signed)
His most recent drop in platelet count coincide with an illness. Since then, his platelet count has now recovered greater than 50,000. We discussed long-term management. I recommend discontinuation of Nplate and to stay on CellCept only. He will continue weekly blood draw. If his platelet count remained stable, I plan to slowly reduce the dose of CellCept in the future. He agreed with the plan of care.

## 2015-10-08 NOTE — Telephone Encounter (Signed)
per pof to sch pt appt-gave pt copy of avs °

## 2015-10-08 NOTE — Assessment & Plan Note (Signed)
He has skin reaction likely due to excessive sun exposure while on Cellcept. He is on hydrocortisone cream and clindamycin. This is improving. I recommend we continue the same.

## 2015-10-13 ENCOUNTER — Other Ambulatory Visit (HOSPITAL_BASED_OUTPATIENT_CLINIC_OR_DEPARTMENT_OTHER): Payer: Self-pay

## 2015-10-13 ENCOUNTER — Ambulatory Visit (HOSPITAL_COMMUNITY)
Admission: RE | Admit: 2015-10-13 | Discharge: 2015-10-13 | Disposition: A | Discharge status: Home / Self Care | Payer: Self-pay | Source: Ambulatory Visit | Attending: Hematology and Oncology | Admitting: Hematology and Oncology

## 2015-10-13 ENCOUNTER — Ambulatory Visit (HOSPITAL_BASED_OUTPATIENT_CLINIC_OR_DEPARTMENT_OTHER): Payer: Self-pay | Admitting: Hematology and Oncology

## 2015-10-13 ENCOUNTER — Encounter: Payer: Self-pay | Admitting: Hematology and Oncology

## 2015-10-13 VITALS — BP 107/57 | HR 67 | Temp 98.1°F | Resp 20

## 2015-10-13 VITALS — BP 134/71 | HR 65 | Temp 97.3°F | Resp 18 | Ht 62.0 in | Wt 146.9 lb

## 2015-10-13 DIAGNOSIS — D693 Immune thrombocytopenic purpura: Secondary | ICD-10-CM

## 2015-10-13 DIAGNOSIS — D696 Thrombocytopenia, unspecified: Secondary | ICD-10-CM

## 2015-10-13 DIAGNOSIS — D72829 Elevated white blood cell count, unspecified: Secondary | ICD-10-CM

## 2015-10-13 DIAGNOSIS — L270 Generalized skin eruption due to drugs and medicaments taken internally: Secondary | ICD-10-CM

## 2015-10-13 LAB — COMPREHENSIVE METABOLIC PANEL (CC13)
ALT: 30 U/L (ref 0–55)
ANION GAP: 8 meq/L (ref 3–11)
AST: 28 U/L (ref 5–34)
Albumin: 4.5 g/dL (ref 3.5–5.0)
Alkaline Phosphatase: 64 U/L (ref 40–150)
BILIRUBIN TOTAL: 0.67 mg/dL (ref 0.20–1.20)
BUN: 13.6 mg/dL (ref 7.0–26.0)
CALCIUM: 10 mg/dL (ref 8.4–10.4)
CHLORIDE: 106 meq/L (ref 98–109)
CO2: 26 mEq/L (ref 22–29)
CREATININE: 0.9 mg/dL (ref 0.7–1.3)
EGFR: >90 mL/min/{1.73_m2} (ref >90)
Glucose: 89 mg/dl (ref 70–140)
Potassium: 3.9 mEq/L (ref 3.5–5.1)
Sodium: 140 mEq/L (ref 136–145)
Total Protein: 7.8 g/dL (ref 6.4–8.3)

## 2015-10-13 LAB — CBC WITH DIFFERENTIAL/PLATELET
BASO%: 1.3 % (ref 0.0–2.0)
BASOS ABS: 0.1 10*3/uL (ref 0.0–0.1)
EOS%: 3.3 % (ref 0.0–7.0)
Eosinophils Absolute: 0.4 10*3/uL (ref 0.0–0.5)
HEMATOCRIT: 47.2 % (ref 38.4–49.9)
HGB: 15.1 g/dL (ref 13.0–17.1)
LYMPH#: 1.9 10*3/uL (ref 0.9–3.3)
LYMPH%: 16.4 % (ref 14.0–49.0)
MCH: 24.9 pg — AB (ref 27.2–33.4)
MCHC: 32 g/dL (ref 32.0–36.0)
MCV: 77.8 fL — ABNORMAL LOW (ref 79.3–98.0)
MONO#: 0.6 10*3/uL (ref 0.1–0.9)
MONO%: 4.8 % (ref 0.0–14.0)
NEUT#: 8.7 10*3/uL — ABNORMAL HIGH (ref 1.5–6.5)
NEUT%: 74.2 % (ref 39.0–75.0)
PLATELETS: 2 10*3/uL — AB (ref 140–400)
RBC: 6.07 10*6/uL — ABNORMAL HIGH (ref 4.20–5.82)
RDW: 13.9 % (ref 11.0–14.6)
WBC: 11.7 10*3/uL — ABNORMAL HIGH (ref 4.0–10.3)

## 2015-10-13 MED ORDER — ACETAMINOPHEN 325 MG PO TABS
650.0000 mg | ORAL_TABLET | Freq: Once | ORAL | Status: AC
  Administered 2015-10-13: 650 mg via ORAL
  Filled 2015-10-13: qty 2

## 2015-10-13 MED ORDER — DIPHENHYDRAMINE HCL 25 MG PO CAPS
25.0000 mg | ORAL_CAPSULE | Freq: Once | ORAL | Status: AC
  Administered 2015-10-13: 25 mg via ORAL
  Filled 2015-10-13: qty 1

## 2015-10-13 MED ORDER — SODIUM CHLORIDE 0.9 % IV SOLN
250.0000 mL | Freq: Once | INTRAVENOUS | Status: AC
  Administered 2015-10-13: 250 mL via INTRAVENOUS

## 2015-10-13 MED ORDER — ROMIPLOSTIM INJECTION 500 MCG
470.0000 ug | SUBCUTANEOUS | Status: DC
  Administered 2015-10-13: 470 ug via SUBCUTANEOUS
  Filled 2015-10-13: qty 0.94

## 2015-10-13 NOTE — Procedures (Signed)
Associated diagnosis: Idiopathic thrombocytopenia purpura MD; Bertis RuddyGorsuch  Procedure Note: Infusion 1 unit platelets Condition during procedure: Tolerated well Condition after procedure: Alert, oriented, and ambulatory

## 2015-10-13 NOTE — Assessment & Plan Note (Signed)
Unfortunately, he is dependent on growth factor stimulation. Since discontinuation of endplate, he has now relapse of ITP. Again, we have discussed treatment options. I will proceed with platelet transfusion today along with restarting him back on Nplate.  We discussed some of the risks, benefits, and alternatives of platelets transfusions. The patient is symptomatic from low platelet counts with bruising/bleeding/at high risk of life-threatening bleeding and the platelet count is critically low.  Some of the side-effects to be expected including risks of transfusion reactions, chills, infection, syndrome of volume overload and risk of hospitalization from various reasons and the patient is willing to proceed and went ahead to sign consent today. He will return in 2 days and then weekly to get his injection. Once we can get his platelet count stable, I could attempt to see if we can withdraw CellCept in the future.

## 2015-10-13 NOTE — Assessment & Plan Note (Signed)
This is due to Cellcept therapy. Observe.

## 2015-10-13 NOTE — Assessment & Plan Note (Signed)
As above, we will proceed with platelet transfusion today. Whenever his platelet count is 15,000 or lower, he will get 1 unit of platelets.

## 2015-10-13 NOTE — Assessment & Plan Note (Signed)
He has skin reaction likely due to excessive sun exposure while on Cellcept. He is on hydrocortisone cream and clindamycin. This is improving. I recommend we continue the same.     

## 2015-10-13 NOTE — Progress Notes (Signed)
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 He was recently hospitalized for similar reason of in April 2016, twice, and had received IVIG and corticosteroid therapy. Please see my initial consult note dated 03/26/2015 for further details. The patient was started on IVIG, dexamethasone, Solu-Medrol and prednisone. He is consider steroid refractory. On 04/23/2015, he is started on CellCept 250 mg twice a day From 04/30/2015 to 04/28/2015, he was readmitted to the hospital due to bleeding. Bone marrow aspirate and biopsy performed on 04/27/2015 confirmed diagnosis of ITP. He was started also in addition to all the above, Nplate injection weekly On 05/04/2015, he received his last platelet transfusion. Starting 05/07/2015, prednisone taper was initiated. On 05/31/2015, prednisone was discontinued and the patient was placed on CellCept along with Nplate  on 07/14/2015, CellCept was reduced to 500 mg twice a day along with weekly Nplate injection On 09/10/2015, he suffered from relapse due to recent infection. 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 He received 1 unit of platelet transfusion INTERVAL HISTORY:History is obtained via an interpreter John Brennan 29 y.o. male returns for urgent evaluation today. He noticed significant bruising and petechiae rash. The patient denies any recent signs or symptoms of bleeding such as spontaneous epistaxis, hematuria or hematochezia.   I have reviewed the past medical history, past surgical history, social history and family history with the patient and they are unchanged from previous note.  ALLERGIES:  has  No Known Allergies.  MEDICATIONS:  Current Outpatient Prescriptions  Medication Sig Dispense Refill  . acetaminophen (TYLENOL) 325 MG tablet Take 2 tablets (650 mg total) by mouth every 6 (six) hours as needed for mild pain, fever or headache (or Fever >/= 101).    . clindamycin (CLINDAGEL) 1 % gel APPLY TO AFFECTED AREA TWICE DAILY 30 g 0  . hydrocortisone 1 % ointment Apply 1 application topically 2 (two) times daily. Large tube available 30 g 0  . mycophenolate (CELLCEPT) 250 MG capsule Take 250 mg by mouth 2 (two) times daily. Take w/ 500 mg daily to equal 750 mg twice daily.    . mycophenolate (CELLCEPT) 500 MG tablet Take 1 tablet (500 mg total) by mouth 2 (two) times daily. (Patient taking differently: Take 500 mg by mouth 2 (two) times daily. W/ 250 mg to equal 750 mg twice daily.) 60 tablet 0   Current Facility-Administered Medications  Medication Dose Route Frequency Provider Last Rate Last Dose  . romiPLOStim (NPLATE) injection 470 mcg  470 mcg Subcutaneous Weekly Wen Munford, MD         REVIEW OF SYSTEMS:   Constitutional: Denies fevers, chills or night sweats Eyes: Denies blurriness of vision Ears, nose, mouth, throat, and face: Denies mucositis or sore throat Respiratory: Denies cough, dyspnea or wheezes Cardiovascular: Denies palpitation, chest discomfort or lower extremity swelling Gastrointestinal:  Denies nausea, heartburn or change in bowel habits Skin: Denies abnormal skin rashes Lymphatics: Denies new lymphadenopathy  Neurological:Denies numbness, tingling or new weaknesses Behavioral/Psych: Mood is stable, no new changes  All other systems were reviewed with the patient and are negative.  PHYSICAL EXAMINATION: ECOG PERFORMANCE STATUS: 0 - Asymptomatic  Filed Vitals:   10/13/15 1418  BP: 134/71  Pulse: 65  Temp: 97.3 F (36.3 C)  Resp: 18   Filed Weights   10/13/15 1418  Weight: 146 lb 14.4 oz (66.633 kg)    GENERAL:alert, no distress and  comfortable SKIN: noted petechiae rash. EYES: normal, Conjunctiva are pink and non-injected, sclera clear OROPHARYNX:no exudate, no erythema and lips, buccal mucosa, and tongue normal  NECK: supple, thyroid normal size, non-tender, without nodularity LYMPH:  no palpable lymphadenopathy in the cervical, axillary or inguinal LUNGS: clear to auscultation and percussion with normal breathing effort HEART: regular rate & rhythm and no murmurs and no lower extremity edema ABDOMEN:abdomen soft, non-tender and normal bowel sounds Musculoskeletal:no cyanosis of digits and no clubbing  NEURO: alert & oriented x 3 with fluent speech, no focal motor/sensory deficits  LABORATORY DATA:  I have reviewed the data as listed No results found for this or any previous visit (from the past 48 hour(s)).  Lab Results  Component Value Date   WBC 11.7* 10/13/2015   HGB 15.1 10/13/2015   HCT 47.2 10/13/2015   MCV 77.8* 10/13/2015   PLT 2* 10/13/2015    ASSESSMENT & PLAN:  ITP (idiopathic thrombocytopenic purpura) Unfortunately, he is dependent on growth factor stimulation. Since discontinuation of endplate, he has now relapse of ITP. Again, we have discussed treatment options. I will proceed with platelet transfusion today along with restarting him back on Nplate.  We discussed some of the risks, benefits, and alternatives of platelets transfusions. The patient is symptomatic from low platelet counts with bruising/bleeding/at high risk of life-threatening bleeding and the platelet count is critically low.  Some of the side-effects to be expected including risks of transfusion reactions, chills, infection, syndrome of volume overload and risk of hospitalization from various reasons and the patient is willing to proceed and went ahead to sign consent today. He will return in 2 days and then weekly to get his injection. Once we can get his platelet count stable, I could attempt to see if we can withdraw CellCept  in the future.  Thrombocytopenia As above, we will proceed with platelet transfusion today. Whenever his platelet count is 15,000 or lower, he will get 1 unit of platelets.  Drug-induced skin rash  He has skin reaction likely due to excessive sun exposure while on Cellcept. He is on hydrocortisone cream and clindamycin. This is improving. I recommend we continue the same.    Leukocytosis This is due to Cellcept therapy. Observe.     All questions were answered. The patient knows to call the clinic with any problems, questions or concerns. No barriers to learning was detected.  I spent 25 minutes counseling the patient face to face. The total time spent in the appointment was 40 minutes and more than 50% was on counseling.     Almetta Liddicoat, MD 10/19/20162:37 PM

## 2015-10-13 NOTE — Addendum Note (Signed)
Addended by: Carola RhineHOLLAND, Levenia Skalicky A on: 10/13/2015 02:52 PM   Modules accepted: Orders

## 2015-10-14 LAB — PREPARE PLATELET PHERESIS: Unit division: 0

## 2015-10-15 ENCOUNTER — Other Ambulatory Visit (HOSPITAL_BASED_OUTPATIENT_CLINIC_OR_DEPARTMENT_OTHER): Payer: Self-pay

## 2015-10-15 ENCOUNTER — Ambulatory Visit: Payer: Self-pay

## 2015-10-15 ENCOUNTER — Other Ambulatory Visit: Payer: Self-pay | Admitting: *Deleted

## 2015-10-15 VITALS — BP 124/62 | HR 64 | Temp 98.2°F | Resp 20

## 2015-10-15 DIAGNOSIS — D696 Thrombocytopenia, unspecified: Secondary | ICD-10-CM

## 2015-10-15 DIAGNOSIS — D693 Immune thrombocytopenic purpura: Secondary | ICD-10-CM

## 2015-10-15 LAB — CBC WITH DIFFERENTIAL/PLATELET
BASO%: 0.5 % (ref 0.0–2.0)
BASOS ABS: 0.1 10*3/uL (ref 0.0–0.1)
EOS%: 1.5 % (ref 0.0–7.0)
Eosinophils Absolute: 0.2 10*3/uL (ref 0.0–0.5)
HEMATOCRIT: 48.2 % (ref 38.4–49.9)
HGB: 15.5 g/dL (ref 13.0–17.1)
LYMPH#: 1.4 10*3/uL (ref 0.9–3.3)
LYMPH%: 9.5 % — AB (ref 14.0–49.0)
MCH: 24.8 pg — AB (ref 27.2–33.4)
MCHC: 32.2 g/dL (ref 32.0–36.0)
MCV: 77.1 fL — ABNORMAL LOW (ref 79.3–98.0)
MONO#: 0.6 10*3/uL (ref 0.1–0.9)
MONO%: 4.3 % (ref 0.0–14.0)
NEUT#: 12.2 10*3/uL — ABNORMAL HIGH (ref 1.5–6.5)
NEUT%: 84.2 % — AB (ref 39.0–75.0)
Platelets: 2 10*3/uL — CL (ref 140–400)
RBC: 6.26 10*6/uL — AB (ref 4.20–5.82)
RDW: 14.2 % (ref 11.0–14.6)
WBC: 14.5 10*3/uL — ABNORMAL HIGH (ref 4.0–10.3)

## 2015-10-15 LAB — COMPREHENSIVE METABOLIC PANEL (CC13)
ALT: 34 U/L (ref 0–55)
ANION GAP: 10 meq/L (ref 3–11)
AST: 30 U/L (ref 5–34)
Albumin: 4.6 g/dL (ref 3.5–5.0)
Alkaline Phosphatase: 62 U/L (ref 40–150)
BUN: 13.9 mg/dL (ref 7.0–26.0)
CALCIUM: 10.1 mg/dL (ref 8.4–10.4)
CHLORIDE: 105 meq/L (ref 98–109)
CO2: 26 meq/L (ref 22–29)
Creatinine: 1 mg/dL (ref 0.7–1.3)
EGFR: >90 mL/min/{1.73_m2} (ref >90)
Glucose: 104 mg/dl (ref 70–140)
Potassium: 3.9 mEq/L (ref 3.5–5.1)
Sodium: 141 mEq/L (ref 136–145)
Total Bilirubin: 0.62 mg/dL (ref 0.20–1.20)
Total Protein: 7.9 g/dL (ref 6.4–8.3)

## 2015-10-15 MED ORDER — DIPHENHYDRAMINE HCL 25 MG PO CAPS
ORAL_CAPSULE | ORAL | Status: AC
  Filled 2015-10-15: qty 1

## 2015-10-15 MED ORDER — ACETAMINOPHEN 325 MG PO TABS
650.0000 mg | ORAL_TABLET | Freq: Once | ORAL | Status: AC
  Administered 2015-10-15: 650 mg via ORAL

## 2015-10-15 MED ORDER — DIPHENHYDRAMINE HCL 25 MG PO CAPS
25.0000 mg | ORAL_CAPSULE | Freq: Once | ORAL | Status: AC
  Administered 2015-10-15: 25 mg via ORAL

## 2015-10-15 MED ORDER — ACETAMINOPHEN 325 MG PO TABS
ORAL_TABLET | ORAL | Status: AC
  Filled 2015-10-15: qty 2

## 2015-10-15 MED ORDER — SODIUM CHLORIDE 0.9 % IV SOLN
INTRAVENOUS | Status: DC
  Administered 2015-10-15: 15:00:00 via INTRAVENOUS

## 2015-10-15 NOTE — Patient Instructions (Signed)
Blood Transfusion   A blood transfusion is a procedure that gives you donated blood through an IV tube. You may need blood because of illness, surgery, or injury. The blood may come from a donor. The blood may also be your own blood that you donated earlier.  The blood you get is made up of different types of cells. You may get:    Red blood cells. These carry oxygen and replace lost blood.    Platelets. These control bleeding.    Plasma. This helps blood to clot.  If you have a clotting disorder, you may also get other types of blood products.   BEFORE THE PROCEDURE   You may have a blood test. This finds out what type of blood you have. It also finds out what kind of blood your body will accept.    If you are going to have a planned surgery, you may donate your own blood. This is done in case you need to have a transfusion.    If you have had an allergic transfusion reaction before, you may be given medicine to help prevent a reaction. Take this medicine only as told by your doctor.   You will have your temperature, blood pressure, and pulse checked.  PROCEDURE    An IV will be started in your hand or arm.    The bag of donated blood will be attached to your IV and run into your vein.    A doctor will regularly check your temperature, blood pressure, and pulse during the procedure. This is done to find any early signs of a transfusion reaction.   If you have any signs or symptoms of a reaction, the procedure may be stopped and you may be given medicine.    When the transfusion is over, your IV will be removed.    Pressure may be applied to the IV site for a few minutes.    A bandage (dressing) will be applied.   The procedure may vary among doctors and hospitals.   AFTER THE PROCEDURE   Your blood pressure, temperature, and pulse will be checked regularly.     This information is not intended to replace advice given to you by your health care provider. Make sure you discuss any questions  you have with your health care provider.     Document Released: 03/09/2009 Document Revised: 01/01/2015 Document Reviewed: 10/21/2014  [employer]vier Interactive Patient Education 2016 [employer]

## 2015-10-17 LAB — PREPARE PLATELET PHERESIS: Unit division: 0

## 2015-10-22 ENCOUNTER — Other Ambulatory Visit (HOSPITAL_BASED_OUTPATIENT_CLINIC_OR_DEPARTMENT_OTHER): Payer: Self-pay

## 2015-10-22 ENCOUNTER — Ambulatory Visit (HOSPITAL_BASED_OUTPATIENT_CLINIC_OR_DEPARTMENT_OTHER): Payer: Self-pay

## 2015-10-22 VITALS — BP 132/57 | HR 73 | Temp 98.8°F

## 2015-10-22 DIAGNOSIS — D696 Thrombocytopenia, unspecified: Secondary | ICD-10-CM

## 2015-10-22 DIAGNOSIS — D693 Immune thrombocytopenic purpura: Secondary | ICD-10-CM

## 2015-10-22 LAB — CBC WITH DIFFERENTIAL/PLATELET
BASO%: 1.1 % (ref 0.0–2.0)
BASOS ABS: 0.1 10*3/uL (ref 0.0–0.1)
EOS ABS: 0.3 10*3/uL (ref 0.0–0.5)
EOS%: 3.1 % (ref 0.0–7.0)
HEMATOCRIT: 45.2 % (ref 38.4–49.9)
HEMOGLOBIN: 14.6 g/dL (ref 13.0–17.1)
LYMPH%: 20.5 % (ref 14.0–49.0)
MCH: 25.2 pg — AB (ref 27.2–33.4)
MCHC: 32.3 g/dL (ref 32.0–36.0)
MCV: 77.8 fL — AB (ref 79.3–98.0)
MONO#: 0.8 10*3/uL (ref 0.1–0.9)
MONO%: 7.5 % (ref 0.0–14.0)
NEUT#: 7.2 10*3/uL — ABNORMAL HIGH (ref 1.5–6.5)
NEUT%: 67.8 % (ref 39.0–75.0)
PLATELETS: 105 10*3/uL — AB (ref 140–400)
RBC: 5.81 10*6/uL (ref 4.20–5.82)
RDW: 13.9 % (ref 11.0–14.6)
WBC: 10.6 10*3/uL — AB (ref 4.0–10.3)
lymph#: 2.2 10*3/uL (ref 0.9–3.3)

## 2015-10-22 LAB — COMPREHENSIVE METABOLIC PANEL (CC13)
ALBUMIN: 4.5 g/dL (ref 3.5–5.0)
ALK PHOS: 61 U/L (ref 40–150)
ALT: 36 U/L (ref 0–55)
ANION GAP: 8 meq/L (ref 3–11)
AST: 30 U/L (ref 5–34)
BILIRUBIN TOTAL: 0.55 mg/dL (ref 0.20–1.20)
BUN: 16.2 mg/dL (ref 7.0–26.0)
CALCIUM: 9.8 mg/dL (ref 8.4–10.4)
CO2: 27 mEq/L (ref 22–29)
CREATININE: 0.9 mg/dL (ref 0.7–1.3)
Chloride: 106 mEq/L (ref 98–109)
GLUCOSE: 107 mg/dL (ref 70–140)
POTASSIUM: 4 meq/L (ref 3.5–5.1)
Sodium: 141 mEq/L (ref 136–145)
TOTAL PROTEIN: 7.7 g/dL (ref 6.4–8.3)

## 2015-10-22 MED ORDER — ROMIPLOSTIM INJECTION 500 MCG
470.0000 ug | SUBCUTANEOUS | Status: DC
  Administered 2015-10-22: 470 ug via SUBCUTANEOUS
  Filled 2015-10-22: qty 0.94

## 2015-10-29 ENCOUNTER — Other Ambulatory Visit: Payer: Self-pay | Admitting: *Deleted

## 2015-10-29 ENCOUNTER — Ambulatory Visit (HOSPITAL_BASED_OUTPATIENT_CLINIC_OR_DEPARTMENT_OTHER): Payer: Self-pay

## 2015-10-29 ENCOUNTER — Other Ambulatory Visit (HOSPITAL_BASED_OUTPATIENT_CLINIC_OR_DEPARTMENT_OTHER): Payer: Self-pay

## 2015-10-29 VITALS — BP 128/57 | HR 76 | Temp 98.2°F

## 2015-10-29 DIAGNOSIS — D696 Thrombocytopenia, unspecified: Secondary | ICD-10-CM

## 2015-10-29 DIAGNOSIS — D693 Immune thrombocytopenic purpura: Secondary | ICD-10-CM

## 2015-10-29 LAB — COMPREHENSIVE METABOLIC PANEL (CC13)
ALT: 37 U/L (ref 0–55)
ANION GAP: 10 meq/L (ref 3–11)
AST: 22 U/L (ref 5–34)
Albumin: 4.5 g/dL (ref 3.5–5.0)
Alkaline Phosphatase: 58 U/L (ref 40–150)
BILIRUBIN TOTAL: 0.59 mg/dL (ref 0.20–1.20)
BUN: 13.1 mg/dL (ref 7.0–26.0)
CALCIUM: 9.8 mg/dL (ref 8.4–10.4)
CO2: 26 meq/L (ref 22–29)
CREATININE: 0.8 mg/dL (ref 0.7–1.3)
Chloride: 106 mEq/L (ref 98–109)
EGFR: >90 mL/min/{1.73_m2} (ref >90)
Glucose: 118 mg/dl (ref 70–140)
Potassium: 4 mEq/L (ref 3.5–5.1)
Sodium: 142 mEq/L (ref 136–145)
TOTAL PROTEIN: 7.5 g/dL (ref 6.4–8.3)

## 2015-10-29 LAB — CBC WITH DIFFERENTIAL/PLATELET
BASO%: 0.6 % (ref 0.0–2.0)
Basophils Absolute: 0.1 10*3/uL (ref 0.0–0.1)
EOS ABS: 0.4 10*3/uL (ref 0.0–0.5)
EOS%: 3.2 % (ref 0.0–7.0)
HEMATOCRIT: 44.8 % (ref 38.4–49.9)
HGB: 14.8 g/dL (ref 13.0–17.1)
LYMPH#: 1.9 10*3/uL (ref 0.9–3.3)
LYMPH%: 17.1 % (ref 14.0–49.0)
MCH: 26.1 pg — ABNORMAL LOW (ref 27.2–33.4)
MCHC: 33 g/dL (ref 32.0–36.0)
MCV: 79 fL — ABNORMAL LOW (ref 79.3–98.0)
MONO#: 0.5 10*3/uL (ref 0.1–0.9)
MONO%: 4.7 % (ref 0.0–14.0)
NEUT%: 74.4 % (ref 39.0–75.0)
NEUTROS ABS: 8.3 10*3/uL — AB (ref 1.5–6.5)
NRBC: 0 % (ref 0–0)
PLATELETS: 32 10*3/uL — AB (ref 140–400)
RBC: 5.67 10*6/uL (ref 4.20–5.82)
RDW: 13.8 % (ref 11.0–14.6)
WBC: 11.1 10*3/uL — AB (ref 4.0–10.3)

## 2015-10-29 MED ORDER — MYCOPHENOLATE MOFETIL 250 MG PO CAPS: 250.0000 mg | ORAL_CAPSULE | Freq: Two times a day (BID) | ORAL | Status: DC

## 2015-10-29 MED ORDER — MYCOPHENOLATE MOFETIL 500 MG PO TABS: 500.0000 mg | ORAL_TABLET | Freq: Two times a day (BID) | ORAL | Status: DC

## 2015-10-29 MED ORDER — ROMIPLOSTIM INJECTION 500 MCG
530.0000 ug | SUBCUTANEOUS | Status: DC
  Administered 2015-10-29: 530 ug via SUBCUTANEOUS
  Filled 2015-10-29: qty 1.06

## 2015-11-01 ENCOUNTER — Telehealth: Payer: Self-pay | Admitting: *Deleted

## 2015-11-01 NOTE — Telephone Encounter (Signed)
[facility] left VM to confirm dose of Cellcept.   Confirmed with Dr. Bertis RuddyGorsuch dose is 750 mg BID.  Pt to take one 500 mg plus 250 mg tablet twice a day to equal 750 mg BID as prescribed.

## 2015-11-05 ENCOUNTER — Other Ambulatory Visit: Payer: Self-pay | Admitting: Hematology and Oncology

## 2015-11-05 ENCOUNTER — Ambulatory Visit (HOSPITAL_BASED_OUTPATIENT_CLINIC_OR_DEPARTMENT_OTHER): Payer: Self-pay

## 2015-11-05 ENCOUNTER — Other Ambulatory Visit (HOSPITAL_BASED_OUTPATIENT_CLINIC_OR_DEPARTMENT_OTHER): Payer: Self-pay

## 2015-11-05 VITALS — BP 127/67 | HR 100 | Temp 98.4°F | Resp 18

## 2015-11-05 DIAGNOSIS — D693 Immune thrombocytopenic purpura: Secondary | ICD-10-CM

## 2015-11-05 DIAGNOSIS — D696 Thrombocytopenia, unspecified: Secondary | ICD-10-CM

## 2015-11-05 LAB — COMPREHENSIVE METABOLIC PANEL (CC13)
ALT: 43 U/L (ref 0–55)
ANION GAP: 10 meq/L (ref 3–11)
AST: 25 U/L (ref 5–34)
Albumin: 4.5 g/dL (ref 3.5–5.0)
Alkaline Phosphatase: 59 U/L (ref 40–150)
BILIRUBIN TOTAL: 0.53 mg/dL (ref 0.20–1.20)
BUN: 15.8 mg/dL (ref 7.0–26.0)
CALCIUM: 9.7 mg/dL (ref 8.4–10.4)
CHLORIDE: 104 meq/L (ref 98–109)
CO2: 27 meq/L (ref 22–29)
CREATININE: 0.9 mg/dL (ref 0.7–1.3)
Glucose: 103 mg/dl (ref 70–140)
Potassium: 4 mEq/L (ref 3.5–5.1)
Sodium: 141 mEq/L (ref 136–145)
TOTAL PROTEIN: 7.6 g/dL (ref 6.4–8.3)

## 2015-11-05 LAB — CBC WITH DIFFERENTIAL/PLATELET
BASO%: 0.6 % (ref 0.0–2.0)
BASOS ABS: 0.1 10*3/uL (ref 0.0–0.1)
EOS ABS: 0.3 10*3/uL (ref 0.0–0.5)
EOS%: 2.6 % (ref 0.0–7.0)
HEMATOCRIT: 47.3 % (ref 38.4–49.9)
HGB: 15.1 g/dL (ref 13.0–17.1)
LYMPH#: 1.7 10*3/uL (ref 0.9–3.3)
LYMPH%: 15 % (ref 14.0–49.0)
MCH: 24.9 pg — ABNORMAL LOW (ref 27.2–33.4)
MCHC: 32 g/dL (ref 32.0–36.0)
MCV: 77.9 fL — AB (ref 79.3–98.0)
MONO#: 0.5 10*3/uL (ref 0.1–0.9)
MONO%: 4.7 % (ref 0.0–14.0)
NEUT#: 8.7 10*3/uL — ABNORMAL HIGH (ref 1.5–6.5)
NEUT%: 77.1 % — AB (ref 39.0–75.0)
PLATELETS: 77 10*3/uL — AB (ref 140–400)
RBC: 6.07 10*6/uL — AB (ref 4.20–5.82)
RDW: 14.5 % (ref 11.0–14.6)
WBC: 11.3 10*3/uL — ABNORMAL HIGH (ref 4.0–10.3)

## 2015-11-05 MED ORDER — ROMIPLOSTIM INJECTION 500 MCG
530.0000 ug | SUBCUTANEOUS | Status: DC
  Administered 2015-11-05: 530 ug via SUBCUTANEOUS
  Filled 2015-11-05: qty 1.06

## 2015-11-05 NOTE — Patient Instructions (Signed)
Romiplostim injection Qu es este medicamento? El ROMIPLOSTIM ayuda a que su cuerpo produzca ms plaquetas. Este medicamento se [place] Islandsutiliza para tratar el nivel bajo de plaquetas causado por prpura trombocitopnica inmunitaria crnica (PTI). Este medicamento puede ser utilizado para otros usos; si tiene alguna pregunta consulte con su proveedor de atencin mdica o con su farmacutico. MARCAS COMERCIALES DISPONIBLES: Nplate Qu le debo informar a mi profesional de la salud antes de tomar este medicamento? Necesita saber si usted presenta alguno de los siguientes problemas o situaciones: -si tiene cncer o sndrome mielodisplsico -conteos sanguneos bajos, como baja cantidad de glbulos blancos, glbulos rojos y plaquetas -toma medicamentos que tratan o previenen cogulos sanguneos -una reaccin alrgica o inusual al romiplostim, al manitol, a otros medicamentos, alimentos, colorantes o conservantes -si est embarazada o buscando quedar embarazada -si est amamantando a un beb Cmo debo utilizar este medicamento? Este medicamento se administra mediante inyeccin debajo de la piel. Lo administra un profesional de Radiographer, therapeuticla salud en un hospital o en un entorno clnico. Su farmacutico le dar una [place] del medicamento especial con cada receta y relleno. Asegrese de leer esta informacin cada vez cuidadosamente. Hable con su pediatra para informarse acerca del uso de este medicamento en nios. Puede requerir atencin especial. Sobredosis: Pngase en contacto inmediatamente con un centro toxicolgico o una sala de urgencia si usted cree que haya tomado demasiado medicamento. ATENCIN: Reynolds AmericanEste medicamento es solo para usted. No comparta este medicamento con nadie. Qu sucede si me olvido de una dosis? Es importante no olvidar ninguna dosis. Informe a su mdico o a su profesional de la salud si no puede asistir a Marketing executiveuna cita. Qu puede interactuar con este medicamento? No se esperan interacciones. Puede ser  que esta lista no menciona todas las posibles interacciones. Informe a su profesional de Beazer Homesla salud de [employer] Inctodos los productos a base de hierbas, medicamentos de [place] libre o suplementos nutritivos que est tomando. Si usted fuma, consume bebidas alcohlicas o si utiliza drogas ilegales, indqueselo tambin a su profesional de Beazer Homesla salud. Algunas sustancias pueden interactuar con su medicamento. A qu debo estar atento al usar PPL Corporationeste medicamento? Se supervisar su estado de salud atentamente mientras reciba este medicamento. Visite a su profesional que lo receta o a su profesional de la salud para International aid/development workerchequear su evolucin peridicamente y para International aid/development workerrealizarse a los anlisis de sangre necesarios. Es importante asistir a todas sus citas. Qu efectos secundarios puedo tener al Boston Scientificutilizar este medicamento? Efectos secundarios que debe informar a su mdico o a Producer, television/film/videosu profesional de la salud tan pronto como sea posible: -Therapist, artreacciones alrgicas como erupcin cutnea, picazn o urticarias, hinchazn de la cara, labios o lengua -falta de aliento, dolor en el pecho, hinchazn de la pierna -sangrado, magulladuras inusuales Efectos secundarios que, por lo general, no requieren Psychologist, prison and probation servicesatencin mdica (debe informarlos a su mdico o a Producer, television/film/videosu profesional de la salud si persisten o si son molestos): -mareos -dolor de Training and development officercabeza -dolores musculares -dolor de los brazos y las piernas -Theme park managerdolor estomacal -dificultad para Financial traderconciliar el sueo Puede ser que esta lista no menciona todos los posibles efectos secundarios. Comunquese a su mdico por asesoramiento mdico Hewlett-Packardsobre los efectos secundarios. Usted puede informar los efectos secundarios a la FDA por telfono al 1-800-FDA-1088. Dnde debo guardar mi medicina? Este medicamento se administra en hospitales o clnicas y no necesitar guardarlo en su domicilio. ATENCIN: Este folleto es un resumen. Puede ser que no cubra toda la posible informacin. Si usted tiene preguntas acerca de esta medicina, consulte  con su mdico, su  farmacutico o su profesional de la salud.  2015, Elsevier/Gold Standard. (2007-12-30 13:37:00)  

## 2015-11-08 ENCOUNTER — Encounter: Payer: Self-pay | Admitting: Hematology and Oncology

## 2015-11-08 NOTE — Progress Notes (Signed)
Supportive Care       romiPLOStim (NPLATE) injection 530 mcg (Discontinued - Patient Discharge)     Use initial actual body wt for all dosing. Plt < 50K, increase by 1 mcg/kg, Plt > 200K x 2 consecutive weeks decrease by 1 mcg/kg, Plt > 400K hold dose & after plts <200K restart at dose reduced by 1 mcg/kg. Max dose = 10 mcg/kg., 530 mcg, Subcutaneous, Weekly, First dose on Fri 11/05/15 at 1400

## 2015-11-12 ENCOUNTER — Ambulatory Visit (HOSPITAL_BASED_OUTPATIENT_CLINIC_OR_DEPARTMENT_OTHER): Payer: Self-pay | Admitting: Hematology and Oncology

## 2015-11-12 ENCOUNTER — Ambulatory Visit (HOSPITAL_BASED_OUTPATIENT_CLINIC_OR_DEPARTMENT_OTHER): Payer: Self-pay

## 2015-11-12 ENCOUNTER — Encounter: Payer: Self-pay | Admitting: Hematology and Oncology

## 2015-11-12 ENCOUNTER — Other Ambulatory Visit (HOSPITAL_BASED_OUTPATIENT_CLINIC_OR_DEPARTMENT_OTHER): Payer: Self-pay

## 2015-11-12 ENCOUNTER — Telehealth: Payer: Self-pay | Admitting: Hematology and Oncology

## 2015-11-12 VITALS — BP 134/65 | HR 93 | Temp 98.2°F | Resp 18 | Ht 62.0 in | Wt 149.7 lb

## 2015-11-12 DIAGNOSIS — D693 Immune thrombocytopenic purpura: Secondary | ICD-10-CM

## 2015-11-12 DIAGNOSIS — D696 Thrombocytopenia, unspecified: Secondary | ICD-10-CM

## 2015-11-12 DIAGNOSIS — L270 Generalized skin eruption due to drugs and medicaments taken internally: Secondary | ICD-10-CM

## 2015-11-12 LAB — CBC WITH DIFFERENTIAL/PLATELET
BASO%: 0.9 % (ref 0.0–2.0)
BASOS ABS: 0.1 10*3/uL (ref 0.0–0.1)
EOS ABS: 0.4 10*3/uL (ref 0.0–0.5)
EOS%: 3.1 % (ref 0.0–7.0)
HCT: 46.5 % (ref 38.4–49.9)
HGB: 15 g/dL (ref 13.0–17.1)
LYMPH%: 20.4 % (ref 14.0–49.0)
MCH: 25.3 pg — AB (ref 27.2–33.4)
MCHC: 32.2 g/dL (ref 32.0–36.0)
MCV: 78.5 fL — AB (ref 79.3–98.0)
MONO#: 0.7 10*3/uL (ref 0.1–0.9)
MONO%: 5.3 % (ref 0.0–14.0)
NEUT#: 9.4 10*3/uL — ABNORMAL HIGH (ref 1.5–6.5)
NEUT%: 70.3 % (ref 39.0–75.0)
Platelets: 120 10*3/uL — ABNORMAL LOW (ref 140–400)
RBC: 5.92 10*6/uL — AB (ref 4.20–5.82)
RDW: 14.5 % (ref 11.0–14.6)
WBC: 13.3 10*3/uL — ABNORMAL HIGH (ref 4.0–10.3)
lymph#: 2.7 10*3/uL (ref 0.9–3.3)

## 2015-11-12 MED ORDER — ROMIPLOSTIM INJECTION 500 MCG
470.0000 ug | SUBCUTANEOUS | Status: DC
  Administered 2015-11-12: 470 ug via SUBCUTANEOUS
  Filled 2015-11-12: qty 0.94

## 2015-11-12 NOTE — Progress Notes (Signed)
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 He was recently hospitalized for similar reason of in April 2016, twice, and had received IVIG and corticosteroid therapy. Please see my initial consult note dated 03/26/2015 for further details. The patient was started on IVIG, dexamethasone, Solu-Medrol and prednisone. He is consider steroid refractory. On 04/23/2015, he is started on CellCept 250 mg twice a day From 04/30/2015 to 04/28/2015, he was readmitted to the hospital due to bleeding. Bone marrow aspirate and biopsy performed on 04/27/2015 confirmed diagnosis of ITP. He was started also in addition to all the above, Nplate injection weekly On 05/04/2015, he received his last platelet transfusion. Starting 05/07/2015, prednisone taper was initiated. On 05/31/2015, prednisone was discontinued and the patient was placed on CellCept along with Nplate  on 07/14/2015, CellCept was reduced to 500 mg twice a day along with weekly Nplate injection On 09/10/2015, he suffered from relapse due to recent infection. 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 He received 1 unit of platelet transfusion INTERVAL HISTORY:History is obtained via an interpreter John Brennan 29 y.o. male returns for urgent evaluation today. He noticed significant bruising and petechiae rash. The patient denies any recent signs or symptoms of bleeding such as spontaneous epistaxis, hematuria or hematochezia. Denies recent infection. His acne appears to be stable.  I have reviewed the past medical history, past surgical history, social history and family history with the patient and  they are unchanged from previous note.  ALLERGIES:  has No Known Allergies.  MEDICATIONS:  Current Outpatient Prescriptions  Medication Sig Dispense Refill  . acetaminophen (TYLENOL) 325 MG tablet Take 2 tablets (650 mg total) by mouth every 6 (six) hours as needed for mild pain, fever or headache (or Fever >/= 101).    . clindamycin (CLINDAGEL) 1 % gel APPLY TO AFFECTED AREA TWICE DAILY 30 g 0  . hydrocortisone 1 % ointment Apply 1 application topically 2 (two) times daily. Large tube available 30 g 0  . mycophenolate (CELLCEPT) 250 MG capsule Take 1 capsule (250 mg total) by mouth 2 (two) times daily. Take w/ 500 mg daily to equal 750 mg twice daily. 60 capsule 0  . mycophenolate (CELLCEPT) 500 MG tablet Take 1 tablet (500 mg total) by mouth 2 (two) times daily. W/ 250 mg to equal 750 mg twice daily. 60 tablet 0   No current facility-administered medications for this visit.   Facility-Administered Medications Ordered in Other Visits  Medication Dose Route Frequency Provider Last Rate Last Dose  . romiPLOStim (NPLATE) injection 470 mcg  470 mcg Subcutaneous Weekly Camdynn Maranto, MD         REVIEW OF SYSTEMS:   Constitutional: Denies fevers, chills or night sweats Eyes: Denies blurriness of vision Ears, nose, mouth, throat, and face: Denies mucositis or sore throat Respiratory: Denies cough, dyspnea or wheezes Cardiovascular: Denies palpitation, chest discomfort or lower extremity swelling Gastrointestinal:  Denies nausea, heartburn or change in bowel habits Skin: Denies abnormal skin rashes Lymphatics: Denies new lymphadenopathy or easy bruising Neurological:Denies numbness, tingling or new weaknesses Behavioral/Psych: Mood is stable, no new changes  All other systems were reviewed with the patient and are negative.  PHYSICAL EXAMINATION: ECOG PERFORMANCE STATUS: 0 - Asymptomatic  Filed Vitals:   11/12/15 1430  BP: 134/65  Pulse: 93  Temp: 98.2 F (36.8 C)  Resp: 18   Filed  Weights   11/12/15 1430  Weight: 149 lb 11.2 oz (67.903 kg)    GENERAL:alert, no distress and comfortable SKIN: No acne on his face. EYES: normal, Conjunctiva are pink and non-injected, sclera clear Musculoskeletal:no cyanosis of digits and no clubbing  NEURO: alert & oriented x 3 with fluent speech, no focal motor/sensory deficits  LABORATORY DATA:  I have reviewed the data as listed Results for orders placed or performed in visit on 11/12/15 (from the past 48 hour(s))  CBC with Differential/Platelet     Status: Abnormal   Collection Time: 11/12/15  2:21 PM  Result Value Ref Range   WBC 13.3 (H) 4.0 - 10.3 10e3/uL   NEUT# 9.4 (H) 1.5 - 6.5 10e3/uL   HGB 15.0 13.0 - 17.1 g/dL   HCT 16.146.5 09.638.4 - 04.549.9 %   Platelets 120 (L) 140 - 400 10e3/uL   MCV 78.5 (L) 79.3 - 98.0 fL   MCH 25.3 (L) 27.2 - 33.4 pg   MCHC 32.2 32.0 - 36.0 g/dL   RBC 4.095.92 (H) 8.114.20 - 9.145.82 10e6/uL   RDW 14.5 11.0 - 14.6 %   lymph# 2.7 0.9 - 3.3 10e3/uL   MONO# 0.7 0.1 - 0.9 10e3/uL   Eosinophils Absolute 0.4 0.0 - 0.5 10e3/uL   Basophils Absolute 0.1 0.0 - 0.1 10e3/uL   NEUT% 70.3 39.0 - 75.0 %   LYMPH% 20.4 14.0 - 49.0 %   MONO% 5.3 0.0 - 14.0 %   EOS% 3.1 0.0 - 7.0 %   BASO% 0.9 0.0 - 2.0 %    Lab Results  Component Value Date   WBC 13.3* 11/12/2015   HGB 15.0 11/12/2015   HCT 46.5 11/12/2015   MCV 78.5* 11/12/2015   PLT 120* 11/12/2015    ASSESSMENT & PLAN:  ITP (idiopathic thrombocytopenic purpura) Unfortunately, he is dependent on growth factor stimulation. Since discontinuation of Nplate, he had recent relapse of ITP. Again, we have discussed treatment options. With higher dose of Nplate, he is responding well. In the future, I plan to wean him off CellCept  Drug-induced skin rash  He has skin reaction likely due to excessive sun exposure while on Cellcept. He is on clindamycin. This is improving. I recommend we continue the same.     All questions were answered. The patient knows to  call the clinic with any problems, questions or concerns. No barriers to learning was detected.  I spent 15 minutes counseling the patient face to face. The total time spent in the appointment was 20 minutes and more than 50% was on counseling.     [facility]rent[facility] Psychiatric HospitalGORSUCH, Jacqueline Spofford, MD 11/18/20163:04 PM

## 2015-11-12 NOTE — Telephone Encounter (Signed)
Gave and printed appt sched and avs fo rpt for NOV thr Jan 2017

## 2015-11-12 NOTE — Assessment & Plan Note (Signed)
He has skin reaction likely due to excessive sun exposure while on Cellcept. He is on clindamycin. This is improving. I recommend we continue the same.

## 2015-11-12 NOTE — Assessment & Plan Note (Signed)
Unfortunately, he is dependent on growth factor stimulation. Since discontinuation of Nplate, he had recent relapse of ITP. Again, we have discussed treatment options. With higher dose of Nplate, he is responding well. In the future, I plan to wean him off CellCept

## 2015-11-15 ENCOUNTER — Encounter: Payer: Self-pay | Admitting: Pharmacist

## 2015-11-15 NOTE — Progress Notes (Signed)
11/21: Faxed completed [employer] application for NPLATE to [phone]

## 2015-11-19 ENCOUNTER — Ambulatory Visit (HOSPITAL_BASED_OUTPATIENT_CLINIC_OR_DEPARTMENT_OTHER): Payer: Self-pay

## 2015-11-19 ENCOUNTER — Other Ambulatory Visit (HOSPITAL_BASED_OUTPATIENT_CLINIC_OR_DEPARTMENT_OTHER): Payer: Self-pay

## 2015-11-19 VITALS — BP 133/64 | HR 73 | Temp 98.4°F

## 2015-11-19 DIAGNOSIS — D693 Immune thrombocytopenic purpura: Secondary | ICD-10-CM

## 2015-11-19 DIAGNOSIS — D696 Thrombocytopenia, unspecified: Secondary | ICD-10-CM

## 2015-11-19 LAB — CBC WITH DIFFERENTIAL/PLATELET
BASO%: 1.1 % (ref 0.0–2.0)
BASOS ABS: 0.1 10*3/uL (ref 0.0–0.1)
EOS%: 3.5 % (ref 0.0–7.0)
Eosinophils Absolute: 0.4 10*3/uL (ref 0.0–0.5)
HEMATOCRIT: 45.3 % (ref 38.4–49.9)
HGB: 14.5 g/dL (ref 13.0–17.1)
LYMPH#: 2.4 10*3/uL (ref 0.9–3.3)
LYMPH%: 20.5 % (ref 14.0–49.0)
MCH: 25.1 pg — AB (ref 27.2–33.4)
MCHC: 32 g/dL (ref 32.0–36.0)
MCV: 78.4 fL — ABNORMAL LOW (ref 79.3–98.0)
MONO#: 0.7 10*3/uL (ref 0.1–0.9)
MONO%: 6.5 % (ref 0.0–14.0)
NEUT#: 7.9 10*3/uL — ABNORMAL HIGH (ref 1.5–6.5)
NEUT%: 68.4 % (ref 39.0–75.0)
Platelets: 57 10*3/uL — ABNORMAL LOW (ref 140–400)
RBC: 5.77 10*6/uL (ref 4.20–5.82)
RDW: 14.4 % (ref 11.0–14.6)
WBC: 11.5 10*3/uL — ABNORMAL HIGH (ref 4.0–10.3)

## 2015-11-19 MED ORDER — ROMIPLOSTIM INJECTION 500 MCG
470.0000 ug | SUBCUTANEOUS | Status: DC
  Administered 2015-11-19: 470 ug via SUBCUTANEOUS
  Filled 2015-11-19: qty 0.94

## 2015-11-26 ENCOUNTER — Other Ambulatory Visit (HOSPITAL_BASED_OUTPATIENT_CLINIC_OR_DEPARTMENT_OTHER): Payer: Self-pay

## 2015-11-26 ENCOUNTER — Ambulatory Visit (HOSPITAL_BASED_OUTPATIENT_CLINIC_OR_DEPARTMENT_OTHER): Payer: Self-pay

## 2015-11-26 ENCOUNTER — Other Ambulatory Visit: Payer: Self-pay | Admitting: Hematology and Oncology

## 2015-11-26 VITALS — BP 142/72 | HR 75 | Temp 98.9°F

## 2015-11-26 DIAGNOSIS — D693 Immune thrombocytopenic purpura: Secondary | ICD-10-CM

## 2015-11-26 DIAGNOSIS — D696 Thrombocytopenia, unspecified: Secondary | ICD-10-CM

## 2015-11-26 LAB — CBC WITH DIFFERENTIAL/PLATELET
BASO%: 1.1 % (ref 0.0–2.0)
BASOS ABS: 0.1 10*3/uL (ref 0.0–0.1)
EOS ABS: 0.5 10*3/uL (ref 0.0–0.5)
EOS%: 4 % (ref 0.0–7.0)
HEMATOCRIT: 47.7 % (ref 38.4–49.9)
HGB: 15.2 g/dL (ref 13.0–17.1)
LYMPH#: 2.1 10*3/uL (ref 0.9–3.3)
LYMPH%: 17.3 % (ref 14.0–49.0)
MCH: 25.3 pg — AB (ref 27.2–33.4)
MCHC: 31.8 g/dL — AB (ref 32.0–36.0)
MCV: 79.4 fL (ref 79.3–98.0)
MONO#: 0.8 10*3/uL (ref 0.1–0.9)
MONO%: 7 % (ref 0.0–14.0)
NEUT#: 8.5 10*3/uL — ABNORMAL HIGH (ref 1.5–6.5)
NEUT%: 70.6 % (ref 39.0–75.0)
Platelets: 76 10*3/uL — ABNORMAL LOW (ref 140–400)
RBC: 6.01 10*6/uL — AB (ref 4.20–5.82)
RDW: 14.8 % — ABNORMAL HIGH (ref 11.0–14.6)
WBC: 12 10*3/uL — ABNORMAL HIGH (ref 4.0–10.3)

## 2015-11-26 LAB — COMPREHENSIVE METABOLIC PANEL
ALT: 43 U/L (ref 0–55)
AST: 28 U/L (ref 5–34)
Albumin: 4.6 g/dL (ref 3.5–5.0)
Alkaline Phosphatase: 61 U/L (ref 40–150)
Anion Gap: 12 mEq/L — ABNORMAL HIGH (ref 3–11)
BUN: 17.8 mg/dL (ref 7.0–26.0)
CALCIUM: 9.9 mg/dL (ref 8.4–10.4)
CHLORIDE: 106 meq/L (ref 98–109)
CO2: 24 meq/L (ref 22–29)
CREATININE: 1 mg/dL (ref 0.7–1.3)
EGFR: >90 mL/min/{1.73_m2} (ref >90)
Glucose: 81 mg/dl (ref 70–140)
POTASSIUM: 4.1 meq/L (ref 3.5–5.1)
Sodium: 142 mEq/L (ref 136–145)
Total Bilirubin: 0.64 mg/dL (ref 0.20–1.20)
Total Protein: 8 g/dL (ref 6.4–8.3)

## 2015-11-26 MED ORDER — ROMIPLOSTIM INJECTION 500 MCG
470.0000 ug | SUBCUTANEOUS | Status: DC
  Administered 2015-11-26: 470 ug via SUBCUTANEOUS
  Filled 2015-11-26: qty 0.94

## 2015-12-03 ENCOUNTER — Other Ambulatory Visit (HOSPITAL_BASED_OUTPATIENT_CLINIC_OR_DEPARTMENT_OTHER): Payer: Self-pay

## 2015-12-03 ENCOUNTER — Ambulatory Visit (HOSPITAL_BASED_OUTPATIENT_CLINIC_OR_DEPARTMENT_OTHER): Payer: Self-pay

## 2015-12-03 ENCOUNTER — Other Ambulatory Visit: Payer: Self-pay | Admitting: *Deleted

## 2015-12-03 VITALS — BP 130/64 | HR 68 | Temp 98.9°F

## 2015-12-03 DIAGNOSIS — D693 Immune thrombocytopenic purpura: Secondary | ICD-10-CM

## 2015-12-03 DIAGNOSIS — D696 Thrombocytopenia, unspecified: Secondary | ICD-10-CM

## 2015-12-03 LAB — CBC WITH DIFFERENTIAL/PLATELET
BASO%: 0.9 % (ref 0.0–2.0)
BASOS ABS: 0.1 10*3/uL (ref 0.0–0.1)
EOS%: 3.3 % (ref 0.0–7.0)
Eosinophils Absolute: 0.4 10*3/uL (ref 0.0–0.5)
HCT: 47.6 % (ref 38.4–49.9)
HGB: 15.1 g/dL (ref 13.0–17.1)
LYMPH%: 19.5 % (ref 14.0–49.0)
MCH: 25.2 pg — AB (ref 27.2–33.4)
MCHC: 31.7 g/dL — AB (ref 32.0–36.0)
MCV: 79.5 fL (ref 79.3–98.0)
MONO#: 0.7 10*3/uL (ref 0.1–0.9)
MONO%: 6.5 % (ref 0.0–14.0)
NEUT#: 7.9 10*3/uL — ABNORMAL HIGH (ref 1.5–6.5)
NEUT%: 69.8 % (ref 39.0–75.0)
PLATELETS: 75 10*3/uL — AB (ref 140–400)
RBC: 5.99 10*6/uL — AB (ref 4.20–5.82)
RDW: 15.1 % — ABNORMAL HIGH (ref 11.0–14.6)
WBC: 11.3 10*3/uL — ABNORMAL HIGH (ref 4.0–10.3)
lymph#: 2.2 10*3/uL (ref 0.9–3.3)

## 2015-12-03 MED ORDER — MYCOPHENOLATE MOFETIL 500 MG PO TABS: 500.0000 mg | ORAL_TABLET | Freq: Two times a day (BID) | ORAL | Status: DC

## 2015-12-03 MED ORDER — ROMIPLOSTIM INJECTION 500 MCG
470.0000 ug | SUBCUTANEOUS | Status: DC
  Administered 2015-12-03: 470 ug via SUBCUTANEOUS
  Filled 2015-12-03: qty 0.94

## 2015-12-10 ENCOUNTER — Other Ambulatory Visit: Payer: Self-pay | Admitting: Hematology and Oncology

## 2015-12-10 ENCOUNTER — Ambulatory Visit (HOSPITAL_BASED_OUTPATIENT_CLINIC_OR_DEPARTMENT_OTHER): Payer: Self-pay

## 2015-12-10 ENCOUNTER — Other Ambulatory Visit (HOSPITAL_BASED_OUTPATIENT_CLINIC_OR_DEPARTMENT_OTHER): Payer: Self-pay

## 2015-12-10 VITALS — BP 126/73 | HR 71 | Temp 98.2°F

## 2015-12-10 DIAGNOSIS — D696 Thrombocytopenia, unspecified: Secondary | ICD-10-CM

## 2015-12-10 DIAGNOSIS — D693 Immune thrombocytopenic purpura: Secondary | ICD-10-CM

## 2015-12-10 LAB — CBC WITH DIFFERENTIAL/PLATELET
BASO%: 0.8 % (ref 0.0–2.0)
BASOS ABS: 0.1 10*3/uL (ref 0.0–0.1)
EOS ABS: 0.2 10*3/uL (ref 0.0–0.5)
EOS%: 1.4 % (ref 0.0–7.0)
HCT: 50.5 % — ABNORMAL HIGH (ref 38.4–49.9)
HGB: 15.9 g/dL (ref 13.0–17.1)
LYMPH%: 8.9 % — AB (ref 14.0–49.0)
MCH: 25.1 pg — AB (ref 27.2–33.4)
MCHC: 31.5 g/dL — AB (ref 32.0–36.0)
MCV: 79.9 fL (ref 79.3–98.0)
MONO#: 0.7 10*3/uL (ref 0.1–0.9)
MONO%: 4.2 % (ref 0.0–14.0)
NEUT#: 13.5 10*3/uL — ABNORMAL HIGH (ref 1.5–6.5)
NEUT%: 84.7 % — ABNORMAL HIGH (ref 39.0–75.0)
Platelets: 72 10*3/uL — ABNORMAL LOW (ref 140–400)
RBC: 6.32 10*6/uL — AB (ref 4.20–5.82)
RDW: 14.9 % — ABNORMAL HIGH (ref 11.0–14.6)
WBC: 16 10*3/uL — AB (ref 4.0–10.3)
lymph#: 1.4 10*3/uL (ref 0.9–3.3)

## 2015-12-10 MED ORDER — ROMIPLOSTIM INJECTION 500 MCG
470.0000 ug | SUBCUTANEOUS | Status: DC
  Administered 2015-12-10: 470 ug via SUBCUTANEOUS
  Filled 2015-12-10: qty 0.94

## 2015-12-10 NOTE — Progress Notes (Signed)
Per Dr Bertis RuddyGorsuch,  Platelets are being consistant for the last month.  Decrease Cellcept to 500 mg twice daily.  Information given to [place] through the interrupter.  States understanding.

## 2015-12-17 ENCOUNTER — Ambulatory Visit (HOSPITAL_BASED_OUTPATIENT_CLINIC_OR_DEPARTMENT_OTHER): Payer: Self-pay

## 2015-12-17 ENCOUNTER — Other Ambulatory Visit (HOSPITAL_BASED_OUTPATIENT_CLINIC_OR_DEPARTMENT_OTHER): Payer: Self-pay

## 2015-12-17 VITALS — BP 126/67 | HR 63 | Temp 97.9°F

## 2015-12-17 DIAGNOSIS — D693 Immune thrombocytopenic purpura: Secondary | ICD-10-CM

## 2015-12-17 DIAGNOSIS — D696 Thrombocytopenia, unspecified: Secondary | ICD-10-CM

## 2015-12-17 LAB — CBC WITH DIFFERENTIAL/PLATELET
BASO%: 1 % (ref 0.0–2.0)
BASOS ABS: 0.1 10*3/uL (ref 0.0–0.1)
EOS ABS: 0.3 10*3/uL (ref 0.0–0.5)
EOS%: 2.5 % (ref 0.0–7.0)
HEMATOCRIT: 50 % — AB (ref 38.4–49.9)
HEMOGLOBIN: 15.7 g/dL (ref 13.0–17.1)
LYMPH#: 1.9 10*3/uL (ref 0.9–3.3)
LYMPH%: 17.2 % (ref 14.0–49.0)
MCH: 25.2 pg — AB (ref 27.2–33.4)
MCHC: 31.5 g/dL — ABNORMAL LOW (ref 32.0–36.0)
MCV: 80.1 fL (ref 79.3–98.0)
MONO#: 0.7 10*3/uL (ref 0.1–0.9)
MONO%: 5.8 % (ref 0.0–14.0)
NEUT#: 8.3 10*3/uL — ABNORMAL HIGH (ref 1.5–6.5)
NEUT%: 73.5 % (ref 39.0–75.0)
Platelets: 120 10*3/uL — ABNORMAL LOW (ref 140–400)
RBC: 6.24 10*6/uL — ABNORMAL HIGH (ref 4.20–5.82)
RDW: 14.9 % — AB (ref 11.0–14.6)
WBC: 11.3 10*3/uL — ABNORMAL HIGH (ref 4.0–10.3)

## 2015-12-17 MED ORDER — ROMIPLOSTIM INJECTION 500 MCG
470.0000 ug | SUBCUTANEOUS | Status: DC
  Administered 2015-12-17: 470 ug via SUBCUTANEOUS
  Filled 2015-12-17: qty 0.94

## 2015-12-24 ENCOUNTER — Ambulatory Visit (HOSPITAL_BASED_OUTPATIENT_CLINIC_OR_DEPARTMENT_OTHER): Payer: Self-pay

## 2015-12-24 ENCOUNTER — Other Ambulatory Visit (HOSPITAL_BASED_OUTPATIENT_CLINIC_OR_DEPARTMENT_OTHER): Payer: Self-pay

## 2015-12-24 ENCOUNTER — Other Ambulatory Visit: Payer: Self-pay | Admitting: *Deleted

## 2015-12-24 ENCOUNTER — Telehealth: Payer: Self-pay | Admitting: Hematology and Oncology

## 2015-12-24 VITALS — BP 124/96 | HR 66 | Temp 98.4°F

## 2015-12-24 DIAGNOSIS — D693 Immune thrombocytopenic purpura: Secondary | ICD-10-CM

## 2015-12-24 DIAGNOSIS — D696 Thrombocytopenia, unspecified: Secondary | ICD-10-CM

## 2015-12-24 LAB — COMPREHENSIVE METABOLIC PANEL
ALBUMIN: 4.7 g/dL (ref 3.5–5.0)
ALK PHOS: 63 U/L (ref 40–150)
ALT: 30 U/L (ref 0–55)
AST: 23 U/L (ref 5–34)
Anion Gap: 8 mEq/L (ref 3–11)
BUN: 11.9 mg/dL (ref 7.0–26.0)
CALCIUM: 9.7 mg/dL (ref 8.4–10.4)
CO2: 25 meq/L (ref 22–29)
Chloride: 107 mEq/L (ref 98–109)
Creatinine: 0.9 mg/dL (ref 0.7–1.3)
EGFR: >90 mL/min/{1.73_m2} (ref >90)
GLUCOSE: 100 mg/dL (ref 70–140)
POTASSIUM: 4 meq/L (ref 3.5–5.1)
SODIUM: 140 meq/L (ref 136–145)
TOTAL PROTEIN: 7.9 g/dL (ref 6.4–8.3)
Total Bilirubin: 0.57 mg/dL (ref 0.20–1.20)

## 2015-12-24 LAB — CBC WITH DIFFERENTIAL/PLATELET
BASO%: 0.5 % (ref 0.0–2.0)
Basophils Absolute: 0.1 10*3/uL (ref 0.0–0.1)
EOS%: 3.7 % (ref 0.0–7.0)
Eosinophils Absolute: 0.4 10*3/uL (ref 0.0–0.5)
HEMATOCRIT: 47.9 % (ref 38.4–49.9)
HEMOGLOBIN: 16 g/dL (ref 13.0–17.1)
LYMPH#: 1.6 10*3/uL (ref 0.9–3.3)
LYMPH%: 16.7 % (ref 14.0–49.0)
MCH: 26.7 pg — AB (ref 27.2–33.4)
MCHC: 33.4 g/dL (ref 32.0–36.0)
MCV: 80 fL (ref 79.3–98.0)
MONO#: 0.6 10*3/uL (ref 0.1–0.9)
MONO%: 6.2 % (ref 0.0–14.0)
NEUT%: 72.9 % (ref 39.0–75.0)
NEUTROS ABS: 6.8 10*3/uL — AB (ref 1.5–6.5)
NRBC: 0 % (ref 0–0)
Platelets: 29 10*3/uL — ABNORMAL LOW (ref 140–400)
RBC: 5.99 10*6/uL — ABNORMAL HIGH (ref 4.20–5.82)
RDW: 13.8 % (ref 11.0–14.6)
WBC: 9.4 10*3/uL (ref 4.0–10.3)

## 2015-12-24 MED ORDER — ROMIPLOSTIM INJECTION 500 MCG
530.0000 ug | SUBCUTANEOUS | Status: DC
  Administered 2015-12-24: 530 ug via SUBCUTANEOUS
  Filled 2015-12-24: qty 1.06

## 2015-12-24 NOTE — Telephone Encounter (Signed)
Patient in office for lab/inj this afternoon. Gave patient/interpreter Raynelle FanningJulie add on appointment for 12/28/15 @ 1:30 pm. No other orders per 12/30 pof.

## 2015-12-24 NOTE — Progress Notes (Signed)
Nplate dose to be increased to 530 mcg today-pharmacy notified. Increase cell cept to 750 mg BID. Repeat CBC on 12/28/15 Injection nurse to review with patient/interpreter

## 2015-12-26 ENCOUNTER — Inpatient Hospital Stay (HOSPITAL_COMMUNITY)
Admission: EM | Admit: 2015-12-26 | Discharge: 2015-12-27 | DRG: 813 | Disposition: A | Discharge status: Home / Self Care | Payer: Medicaid Other | Attending: Internal Medicine | Admitting: Internal Medicine

## 2015-12-26 ENCOUNTER — Encounter (HOSPITAL_COMMUNITY): Payer: Self-pay | Admitting: Emergency Medicine

## 2015-12-26 DIAGNOSIS — Z79899 Other long term (current) drug therapy: Secondary | ICD-10-CM

## 2015-12-26 DIAGNOSIS — Z833 Family history of diabetes mellitus: Secondary | ICD-10-CM

## 2015-12-26 DIAGNOSIS — R509 Fever, unspecified: Secondary | ICD-10-CM | POA: Diagnosis present

## 2015-12-26 DIAGNOSIS — D693 Immune thrombocytopenic purpura: Principal | ICD-10-CM | POA: Diagnosis present

## 2015-12-26 DIAGNOSIS — K069 Disorder of gingiva and edentulous alveolar ridge, unspecified: Secondary | ICD-10-CM | POA: Diagnosis present

## 2015-12-26 DIAGNOSIS — D696 Thrombocytopenia, unspecified: Secondary | ICD-10-CM

## 2015-12-26 DIAGNOSIS — K760 Fatty (change of) liver, not elsewhere classified: Secondary | ICD-10-CM | POA: Diagnosis present

## 2015-12-26 DIAGNOSIS — K068 Other specified disorders of gingiva and edentulous alveolar ridge: Secondary | ICD-10-CM | POA: Diagnosis not present

## 2015-12-26 LAB — PROTIME-INR
INR: 1.18 (ref 0.00–1.49)
Prothrombin Time: 15.2 seconds (ref 11.6–15.2)

## 2015-12-26 LAB — BASIC METABOLIC PANEL
ANION GAP: 9 (ref 5–15)
BUN: 18 mg/dL (ref 6–20)
CO2: 27 mmol/L (ref 22–32)
Calcium: 9 mg/dL (ref 8.9–10.3)
Chloride: 104 mmol/L (ref 101–111)
Creatinine, Ser: 0.78 mg/dL (ref 0.61–1.24)
GLUCOSE: 98 mg/dL (ref 65–99)
POTASSIUM: 3.5 mmol/L (ref 3.5–5.1)
Sodium: 140 mmol/L (ref 135–145)

## 2015-12-26 LAB — CBC WITH DIFFERENTIAL/PLATELET
BASOS ABS: 0 10*3/uL (ref 0.0–0.1)
Basophils Relative: 0 %
Eosinophils Absolute: 0.1 10*3/uL (ref 0.0–0.7)
Eosinophils Relative: 1 %
HCT: 45.6 % (ref 39.0–52.0)
Hemoglobin: 14.8 g/dL (ref 13.0–17.0)
LYMPHS ABS: 0.7 10*3/uL (ref 0.7–4.0)
Lymphocytes Relative: 11 %
MCH: 26.2 pg (ref 26.0–34.0)
MCHC: 32.5 g/dL (ref 30.0–36.0)
MCV: 80.9 fL (ref 78.0–100.0)
MONO ABS: 0.7 10*3/uL (ref 0.1–1.0)
MONOS PCT: 10 %
NEUTROS PCT: 78 %
Neutro Abs: 5.3 10*3/uL (ref 1.7–7.7)
RBC: 5.64 MIL/uL (ref 4.22–5.81)
RDW: 13.7 % (ref 11.5–15.5)
WBC: 6.8 10*3/uL (ref 4.0–10.5)

## 2015-12-26 LAB — APTT: APTT: 38 s — AB (ref 24–37)

## 2015-12-26 MED ORDER — DEXAMETHASONE SODIUM PHOSPHATE 10 MG/ML IJ SOLN
40.0000 mg | INTRAMUSCULAR | Status: DC
  Administered 2015-12-27: 40 mg via INTRAVENOUS
  Filled 2015-12-26: qty 4

## 2015-12-26 MED ORDER — IMMUNE GLOBULIN (HUMAN) 10 GM/100ML IV SOLN
135.0000 g | Freq: Once | INTRAVENOUS | Status: AC
  Administered 2015-12-26: 135 g via INTRAVENOUS
  Filled 2015-12-26: qty 50

## 2015-12-26 MED ORDER — PANTOPRAZOLE SODIUM 40 MG PO TBEC
40.0000 mg | DELAYED_RELEASE_TABLET | Freq: Every day | ORAL | Status: DC
  Administered 2015-12-26 – 2015-12-27 (×2): 40 mg via ORAL
  Filled 2015-12-26 (×2): qty 1

## 2015-12-26 MED ORDER — DEXAMETHASONE SODIUM PHOSPHATE 10 MG/ML IJ SOLN
40.0000 mg | Freq: Once | INTRAMUSCULAR | Status: AC
  Administered 2015-12-26: 40 mg via INTRAVENOUS
  Filled 2015-12-26: qty 4

## 2015-12-26 NOTE — ED Notes (Signed)
Hospitalist at bedside 

## 2015-12-26 NOTE — H&P (Signed)
History and Physical:    John Brennan   KXF:[id number] DOB: Jan 09, 1986 DOA: 12/26/2015  Referring MD/provider: Dr. Dorie Rank PCP: No PCP Per Patient   Chief Complaint: Bleeding gums.  History of Present Illness:   John Brennan is an 30 y.o. male with a PMH of severe steroid refractory ITP diagnosed 03/2015, under the care of Dr. Alvy Bimler, treated with IVIG and corticosteroids in the past as well as CellCept and Nplate, s/p bone marrow biopsy 04/27/15 confirming ITP, who presents with a 24 hour history of body aches, fever, and bleeding of the gums.  No other bleeding noted.  He was last seen by Dr. Alvy Bimler on 11/12/15 and received Nplate 12/24/15 at the [facility].  No aggravating or alleviating factors.  Upon initial evaluation in the ED, the patients platelets were found to be < 5. [place] translator (ID [id number]) used to communicate with the patient, who is Spanish speaking.  ROS:   Review of Systems  Constitutional: Positive for fever and chills.  HENT: Negative.   Eyes: Negative.   Respiratory: Negative.   Cardiovascular: Negative.   Gastrointestinal: Positive for nausea. Negative for blood in stool and melena.  Genitourinary: Negative for hematuria.  Musculoskeletal: Positive for myalgias.  Skin:       Bleeding gums  Neurological: Negative.   Endo/Heme/Allergies: Bruises/bleeds easily.  Psychiatric/Behavioral: Negative.      Past Medical History:   Past Medical History  Diagnosis Date  . Thrombocytopenia ([place])   . Drug-induced skin rash 07/14/2015  . Fatty liver     Past Surgical History:   History reviewed. No pertinent past surgical history.  Social History:   Social History   Social History  . Marital Status: Single    Spouse Name: N/A  . Number of Children: N/A  . Years of Education: N/A   Occupational History  . Not on file.   Social History Main Topics  . Smoking status: Never Smoker   . Smokeless tobacco: Never Used  . Alcohol Use: 1.8  oz/week    3 Cans of beer per week     Comment: occassional beer  . Drug Use: No  . Sexual Activity: Yes   Other Topics Concern  . Not on file   Social History Narrative    Family history:   Family History  Problem Relation Age of Onset  . Diabetes Father     Allergies   Review of patient's allergies indicates no known allergies.  Current Medications:   Prior to Admission medications   Medication Sig Start Date End Date Taking? Authorizing Provider  acetaminophen (TYLENOL) 325 MG tablet Take 2 tablets (650 mg total) by mouth every 6 (six) hours as needed for mild pain, fever or headache (or Fever >/= 101). 04/28/15  Yes Eugenie Filler, MD  mycophenolate (CELLCEPT) 250 MG capsule Take 1 capsule (250 mg total) by mouth 2 (two) times daily. Take w/ 500 mg daily to equal 750 mg twice daily. 10/29/15  Yes Heath Lark, MD  mycophenolate (CELLCEPT) 500 MG tablet Take 1 tablet (500 mg total) by mouth 2 (two) times daily. W/ 250 mg to equal 750 mg twice daily. 12/03/15  Yes Heath Lark, MD    Physical Exam:   Filed Vitals:   12/26/15 1148 12/26/15 1426  BP: 134/75 113/68  Pulse: 89 63  Temp: 98.1 F (36.7 C)   TempSrc: Oral   Resp: 20 16  SpO2: 100% 100%     Physical Exam: Blood pressure 113/68,  pulse 63, temperature 98.1 F (36.7 C), temperature source Oral, resp. rate 16, SpO2 100 %. Gen: No acute distress. Head: Normocephalic, atraumatic. Eyes: PERRL, EOMI, sclerae nonicteric. Mouth: Oropharynx clear. Neck: Supple, no thyromegaly, no lymphadenopathy, no jugular venous distention. Chest: Lungs CTAB. CV: Heart sounds with soft II/VI SEM, regular. Abdomen: Soft, nontender, nondistended with normal active bowel sounds. Extremities: Extremities are without C/E/C. Skin: Warm and dry. Mild petechiae lower legs. No ecchymosis. Neuro: Alert and oriented times 3, non-focal. Psych: Mood and affect normal.   Data Review:    Labs: Basic Metabolic Panel:  Recent  Labs Lab 12/24/15 1326 12/26/15 1253  NA 140 140  K 4.0 3.5  CL  --  104  CO2 25 27  GLUCOSE 100 98  BUN 11.9 18  CREATININE 0.9 0.78  CALCIUM 9.7 9.0   Liver Function Tests:  Recent Labs Lab 12/24/15 1326  AST 23  ALT 30  ALKPHOS 63  BILITOT 0.57  PROT 7.9  ALBUMIN 4.7   CBC:  Recent Labs Lab 12/24/15 1326 12/26/15 1253  WBC 9.4 6.8  NEUTROABS 6.8* 5.3  HGB 16.0 14.8  HCT 47.9 45.6  MCV 80.0 80.9  PLT 29* <5*    Radiographic Studies: No results found.   EKG: None done.   Assessment/Plan:   Principal Problem:   ITP (idiopathic thrombocytopenic purpura)/thrombocytopenia - Steroid refractory in the past.  - Hematology consult for further treatment recommendations, given treatment failure. - IVIG and decadron per hematologist recommendations. - Suspect will need splenectomy.  DVT prophylaxis - No blood thinners or SCDs due to profound thrombocytopenia.    Code Status / Family Communication / Disposition Plan:   Code Status: Full. Family Communication: Male visitor at bedside. Disposition Plan: Home when platelet count stable, possibly 1-2 days.  Attestation regarding necessity of inpatient status:   The appropriate admission status for this patient is INPATIENT. Inpatient status is judged to be reasonable and necessary in order to provide the required intensity of service to ensure the patient's safety. The patient's presenting symptoms, physical exam findings, and initial radiographic and laboratory data in the context of their chronic comorbidities is felt to place them at high risk for further clinical deterioration. Furthermore, it is not anticipated that the patient will be medically stable for discharge from the hospital within 2 midnights of admission. The following factors support the admission status of inpatient.   -The patient's presenting symptoms include gingival bleeding. - The worrisome physical exam findings include petechiae. - The  initial radiographic and laboratory data are worrisome because of platelet count < 5. - The chronic co-morbidities include ITP. - Patient requires inpatient status due to high intensity of service, high risk for further deterioration and high frequency of surveillance required. - I certify that at the point of admission it is my clinical judgment that the patient will require inpatient hospital care spanning beyond 2 midnights from the point of admission.   Time spent: 45 minutes.  RAMA,CHRISTINA [facility] Pager [phone] Cell: [phone]   If 7PM-7AM, please contact night-coverage [email or web address] Password [facility] 12/26/2015, 3:34 PM

## 2015-12-26 NOTE — ED Notes (Signed)
Pt is being followed by cancer center for thrombocytopenia. Has blood counts every Friday. Pt reports his gums began to bleed last night which can be a sign of his blood counts getting low. Pt also felt feverish and had generalized body aches last night, which resolved prior to coming to the ED today. Pt has appointment on Tuesday. Pt speaks Spanish, used interpreter phone for translation.

## 2015-12-26 NOTE — ED Notes (Signed)
MD at bedside. 

## 2015-12-26 NOTE — Progress Notes (Signed)
IVIG started , patient monitored, no complaints so far, no bleeding noted.

## 2015-12-26 NOTE — ED Notes (Addendum)
RN Fleet Contrasachel starting IV and obtaining labs

## 2015-12-26 NOTE — Consult Note (Signed)
Referral MD  Reason for Referral: Refractory ITP   Chief Complaint  Patient presents with  . gums bleeding   : My gums are bleeding  HPI: John Brennan is a 30 year old Hispanic male. He is a diagnosis of ITP. It sounds like he has very difficult disease. He currently is being treated with CellCept. He also has been on Nplate. However, looks like his plate count has been dropping. He came to the ER today with gingival bleeding.  In the ER, his platelet count less than 5000. His hemoglobin is 14.8. White cell count 6.8. His creatinine 0.78.  I looked at his blood smear. His blood smear did not show any schistocytes. He had very very very few platelets. The platelet that he had was well granulated and a little bit larger. He had no nucleated red cells. He had no target cells. White cells appeared mature.  He does not drink. He does not smoke. He has not traveled recently.  There is no history of HIV or hepatitis exposure.  He is followed by Dr. Bertis Ruddy. She has been incredibly diligent in trying to help his platelet count. He has not yet had his spleen taken out.  His last abdominal ultrasound was back in April 2016. Spleen looked okay. There is a lesion in the liver. On MRI, this appeared to be steatosis.  He's had no bleeding elsewhere. He's had no fever. He's had no rashes. He's had no change in bowel or bladder habits.  Of note, he does not speak [place]. I was able to get a translator over the phone who helped out quite a bit.  He's had no weight loss or weight gain.  Overall, his performance status is ECOG 0. He's been working. He is in [employer].           Past Medical History  Diagnosis Date  . Thrombocytopenia (HCC)   . Drug-induced skin rash 07/14/2015  . Fatty liver   :  History reviewed. No pertinent past surgical history.:   Current facility-administered medications:  .  dexamethasone (DECADRON) injection 40 mg, 40 mg, Intravenous, Once, Linwood Dibbles, MD .   Melene Muller ON 12/27/2015] dexamethasone (DECADRON) injection 40 mg, 40 mg, Intravenous, Q24H, Josph Macho, MD .  Immune Globulin 10% (OCTAGAM) IV infusion 2 g/kg, 2 g/kg, Intravenous, Once, Josph Macho, MD .  pantoprazole (PROTONIX) EC tablet 40 mg, 40 mg, Oral, Daily, Josph Macho, MD  Current outpatient prescriptions:  .  acetaminophen (TYLENOL) 325 MG tablet, Take 2 tablets (650 mg total) by mouth every 6 (six) hours as needed for mild pain, fever or headache (or Fever >/= 101)., Disp: , Rfl:  .  mycophenolate (CELLCEPT) 250 MG capsule, Take 1 capsule (250 mg total) by mouth 2 (two) times daily. Take w/ 500 mg daily to equal 750 mg twice daily., Disp: 60 capsule, Rfl: 0 .  mycophenolate (CELLCEPT) 500 MG tablet, Take 1 tablet (500 mg total) by mouth 2 (two) times daily. W/ 250 mg to equal 750 mg twice daily., Disp: 60 tablet, Rfl: 1:  . dexamethasone  40 mg Intravenous Once  . [START ON 12/27/2015] dexamethasone  40 mg Intravenous Q24H  . IMMUNE GLOBULIN 10% (HUMAN) IV - For Fluid Restriction Only  2 g/kg Intravenous Once  . pantoprazole  40 mg Oral Daily  :  No Known Allergies:  Family History  Problem Relation Age of Onset  . Diabetes Father   :  Social History   Social History  .  Marital Status: Single    Spouse Name: N/A  . Number of Children: N/A  . Years of Education: N/A   Occupational History  . Not on file.   Social History Main Topics  . Smoking status: Never Smoker   . Smokeless tobacco: Never Used  . Alcohol Use: 1.8 oz/week    3 Cans of beer per week     Comment: occassional beer  . Drug Use: No  . Sexual Activity: Yes   Other Topics Concern  . Not on file   Social History Narrative  :  Pertinent items are noted in HPI.  Exam: Patient Vitals for the past 24 hrs:  BP Temp Temp src Pulse Resp SpO2  12/26/15 1426 113/68 mmHg - - 63 16 100 %  12/26/15 1148 134/75 mmHg 98.1 F (36.7 C) Oral 89 20 100 %    John Brennan is a well-developed and  well-nourished young Hispanic male. An exam shows no scleral icterus. He has no oral lesions. He has no adenopathy in the neck. Lungs are clear. Cardiac exam regular rate and rhythm with no murmurs, rubs or bruits. Abdomen is soft. Has good bowel sounds. There is no fluid wave. There is no guarding or rebound tenderness. Has no palpable liver or spleen tip. Externally shows no clubbing, cyanosis or edema. There may be a couple small petechiae on his lower legs. He has no ecchymoses. Neurological exam is nonfocal.    Recent Labs  12/24/15 1326 12/26/15 1253  WBC 9.4 6.8  HGB 16.0 14.8  HCT 47.9 45.6  PLT 29* <5*    Recent Labs  12/24/15 1326 12/26/15 1253  NA 140 140  K 4.0 3.5  CL  --  104  CO2 25 27  GLUCOSE 100 98  BUN 11.9 18  CREATININE 0.9 0.78  CALCIUM 9.7 9.0    Blood smear review:  As above  Pathology: None     Assessment and Plan:  John Brennan is a 30 year old Hispanic male. He is originally from [place]. He has ITP. I would have say that he is refractory.  It does not look like he's had Rituxan as of yet. This always is an option.  He's not been on Promacta.  I think for right now, it would be a good idea to try IVIG. I did this to be worthwhile. I'll give him a full dose of 2 g/kg.  I'll also give him Decadron. I'll give him 40 mg IV daily.   It sounds like he may need to have his spleen taken out. I realize this might be very controversial if he is "steroid refractory".   We will follow along. We will let Dr. Bertis RuddyGorsuch know of his admission.  I do appreciate the help from the hospitalist with his admission.   Hewitt ShortsPete E

## 2015-12-26 NOTE — ED Provider Notes (Signed)
CSN: [id number]     Arrival date & time 12/26/15  1142 History   First MD Initiated Contact with Patient 12/26/15 1151     Chief Complaint  Patient presents with  . gums bleeding    history obtained via Spanish interpreter HPI Patient has a history of severe idiopathic thrombocytopenia.  The patient has required CellCept treatment as well as endplate injections. Patient is followed at the cancer center here at [facility]. Last evening the patient started to feel feverish and had body aches. Those symptoms have resolved however today he started having bleeding from his gums. He has had similar symptoms in the past when his platelets were very low. This appropriately concerned the patient came into the emergency room. Denies any bleeding elsewhere. He denies any chest pain or shortness of breath. Past Medical History  Diagnosis Date  . Thrombocytopenia (HCC)   . Drug-induced skin rash 07/14/2015  . Fatty liver    History reviewed. No pertinent past surgical history. Family History  Problem Relation Age of Onset  . Diabetes Father    Social History  Substance Use Topics  . Smoking status: Never Smoker   . Smokeless tobacco: Never Used  . Alcohol Use: 1.8 oz/week    3 Cans of beer per week     Comment: occassional beer    Review of Systems  All other systems reviewed and are negative.     Allergies  Review of patient's allergies indicates no known allergies.  Home Medications   Prior to Admission medications   Medication Sig Start Date End Date Taking? Authorizing Provider  acetaminophen (TYLENOL) 325 MG tablet Take 2 tablets (650 mg total) by mouth every 6 (six) hours as needed for mild pain, fever or headache (or Fever >/= 101). 04/28/15  Yes Rodolph Bong, MD  mycophenolate (CELLCEPT) 250 MG capsule Take 1 capsule (250 mg total) by mouth 2 (two) times daily. Take w/ 500 mg daily to equal 750 mg twice daily. 10/29/15  Yes Artis Delay, MD  mycophenolate (CELLCEPT) 500 MG tablet  Take 1 tablet (500 mg total) by mouth 2 (two) times daily. W/ 250 mg to equal 750 mg twice daily. 12/03/15  Yes Ni Gorsuch, MD   BP 113/68 mmHg  Pulse 63  Temp(Src) 98.1 F (36.7 C) (Oral)  Resp 16  SpO2 100% Physical Exam  Constitutional: He appears well-developed and well-nourished. No distress.  HENT:  Head: Normocephalic and atraumatic.  Right Ear: External ear normal.  Left Ear: External ear normal.  Blood noted around the gums but no active bleeding  Eyes: Conjunctivae are normal. Right eye exhibits no discharge. Left eye exhibits no discharge. No scleral icterus.  Neck: Neck supple. No tracheal deviation present.  Cardiovascular: Normal rate, regular rhythm and intact distal pulses.   Pulmonary/Chest: Effort normal and breath sounds normal. No stridor. No respiratory distress. He has no wheezes. He has no rales.  Abdominal: Soft. Bowel sounds are normal. He exhibits no distension. There is no tenderness. There is no rebound and no guarding.  Musculoskeletal: He exhibits no edema or tenderness.  Neurological: He is alert. He has normal strength. No cranial nerve deficit (no facial droop, extraocular movements intact, no slurred speech) or sensory deficit. He exhibits normal muscle tone. He displays no seizure activity. Coordination normal.  Skin: Skin is warm and dry. No rash noted.  No petechia or purpura noted  Psychiatric: He has a normal mood and affect.  Nursing note and vitals reviewed.  ED Course  Procedures (including critical care time) Labs Review Labs Reviewed  CBC WITH DIFFERENTIAL/PLATELET - Abnormal; Notable for the following:    Platelets <5 (*)    All other components within normal limits  APTT - Abnormal; Notable for the following:    aPTT 38 (*)    All other components within normal limits  BASIC METABOLIC PANEL  PROTIME-INR    Medications  dexamethasone (DECADRON) injection 40 mg (not administered)     MDM   Final diagnoses:  Thrombocytopenia  (HCC)    I discussed the findings with Dr Myna HidalgoEnnever.  Pt will need admission.  IV decadron, 40 mg.  I will consult the medical service for admission.    Linwood DibblesJon Khaleel Beckom, MD 12/26/15 [phone]

## 2015-12-27 ENCOUNTER — Other Ambulatory Visit: Payer: Self-pay | Admitting: Hematology and Oncology

## 2015-12-27 LAB — CBC
HEMATOCRIT: 44.3 % (ref 39.0–52.0)
HEMOGLOBIN: 14.3 g/dL (ref 13.0–17.0)
MCH: 26.3 pg (ref 26.0–34.0)
MCHC: 32.3 g/dL (ref 30.0–36.0)
MCV: 81.6 fL (ref 78.0–100.0)
PLATELETS: 66 10*3/uL — AB (ref 150–400)
RBC: 5.43 MIL/uL (ref 4.22–5.81)
RDW: 13.7 % (ref 11.5–15.5)
WBC: 9.1 10*3/uL (ref 4.0–10.5)

## 2015-12-27 NOTE — Discharge Instructions (Signed)
Prpura trombocitopnica idioptica (Idiopathic Thrombocytopenic Purpura) La prpura trombocitopnica idioptica (PTI) es una enfermedad en la que el sistema de defensa del cuerpo (sistema inmunitario) ataca las plaquetas. Las plaquetas son glbulos sanguneos que ayudan a formar cogulos y sellar prdidas en vasos sanguneos daados. Con la PTI, hay una gran disminucin de la cantidad de plaquetas. Como consecuencia, sangra con mayor facilidad. Tambin le resulta ms difcil al cuerpo detener el sangrado. En los adultos, la PTI por lo general es una enfermedad de larga duracin. CAUSAS La causa es desconocida. La PTI puede aparecer despus de una infeccin viral, durante el embarazo o por un trastorno inmunitario. FACTORES DE RIESGO El riesgo de PTI puede ser mayor entre:  KenelMujeres.  Adultos entre 20 y 3550 aos. SIGNOS Y SNTOMAS DynegyEntre los signos y los sntomas ms frecuentes, se incluyen los siguientes:  Aparecen hematomas con facilidad.  Un corte que [employer]ra durante mucho tiempo.  Pequeos puntos de sangre prpura (petequias) debajo de la piel, en especial en las espinillas.  Sangre en la orina o en las heces.  Hemorragias nasales.  Encas sangrantes.  Perodos menstruales abundantes. Las formas leves de PTI pueden no causar sntomas. DIAGNSTICO El mdico puede sospechar PTI segn los signos y sntomas. Para hacer un diagnstico, el mdico puede realizar un examen fsico e indicar anlisis de sangre para:  Averiguar cuntas plaquetas tiene.  Determinar la eficacia con la que coagula la Spearfishsangre. Luego, es posible que el mdico realice anlisis de sangre o un estudio de la mdula sea para descartar otras enfermedades que podran causar los sntomas. TRATAMIENTO El tratamiento depende de la gravedad de la enfermedad. Entre las opciones se incluyen las siguientes:  Scientist, physiologicalControlar la enfermedad y el nmero de plaquetas.  Transfusiones sanguneas de anticuerpos o  plaquetas.  Medicamentos como:  Antiinflamatorios fuertes (corticoides).  Medicamentos para estimular la produccin de plaquetas.  Medicamentos para Engineer, agriculturalinhibir el sistema inmunitario.  Extirpacin del bazo. Esto se puede realizar si otros tratamientos no funcionan. INSTRUCCIONES PARA EL CUIDADO EN EL HOGAR  Tome los medicamentos solamente como se lo haya indicado el mdico.  No tome medicamentos de venta libre que disminuyan el nmero de plaquetas, afecten la funcin de las plaquetas o afecten la capacidad de coagulacin del cuerpo. Estos incluyen aspirina e ibuprofeno.  No participe en deportes de contacto u otras actividades de Edison Internationalalto riesgo. Pregntele al mdico qu actividades son seguras para usted.  Concurra a todas las visitas de control como se lo haya indicado el mdico. Esto es importante. SOLICITE ATENCIN MDICA SI:  Aparecen nuevos sntomas.  Los sntomas empeoran. SOLICITE ATENCIN MDICA DE INMEDIATO SI:  Siente un dolor de cabeza intenso y repentino o confusin.  Tiene mucho sangrado.  Tiene nuseas o vmitos.   Esta informacin no tiene Theme park managercomo fin reemplazar el consejo del mdico. Asegrese de hacerle al mdico cualquier pregunta que tenga.   Document Released: 07/08/2014 Elsevier Interactive Patient Education [employer]2016 Elsevier Inc.

## 2015-12-27 NOTE — Discharge Summary (Signed)
 Physician Discharge Summary  John Brennan MRN:[id number] DOB: 05/22/1986 DOA: 12/26/2015  PCP: No PCP Per Patient  Admit date: 12/26/2015 Discharge date: 12/27/2015   Recommendations for Outpatient Follow-Up:   1. F/U scheduled with Dr. Gorsuch.    Discharge Diagnosis:   Principal Problem:    ITP (idiopathic thrombocytopenic purpura) Active Problems:    Thrombocytopenia (HCC)   Discharge disposition:  Home.    Discharge Condition: Improved.  Diet recommendation: Regular.   History of Present Illness:   John Brennan is an 29 y.o. male of severe steroid refractory ITP diagnosed 03/2015, under the care of Dr. Gorsuch, treated with IVIG and corticosteroids in the past as well as CellCept and Nplate, s/p bone marrow biopsy 04/27/15 confirming ITP, who was admitted 12/26/15 with a 24 hour history of body aches, fever, and bleeding of the gums.Upon initial evaluation in the ED, the patients platelets were found to be < 5.   Hospital Course by Problem:    Principal Problem:  ITP (idiopathic thrombocytopenic purpura)/thrombocytopenia - Steroid refractory in the past.  - Hematology consult for further treatment recommendations, given treatment failure. - IVIG and decadron per hematologist recommendations. PPI ordered for GI prophylaxis. - Platelet count 66 today, OK to D/C for platelets > 50 per Dr. Gorsuch. - Suspect will need splenectomy, but patient does not have insurance, limiting treatment options.   Medical Consultants:    Dr. Gorsuch, Hematology.   Discharge Exam:   Filed Vitals:   12/27/15 0000 12/27/15 0611  BP: 110/70 108/62  Pulse: 70 74  Temp: 97.8 F (36.6 C) 97.9 F (36.6 C)  Resp:  16   Filed Vitals:   12/26/15 1930 12/26/15 2053 12/27/15 0000 12/27/15 0611  BP: 100/70  110/70 108/62  Pulse: 80 92 70 74  Temp: 97.1 F (36.2 C)  97.8 F (36.6 C) 97.9 F (36.6 C)  TempSrc: Oral  Oral Oral  Resp: 16   16  Height:      SpO2:  100% 100%  100%    Gen:  NAD Cardiovascular:  RRR, No M/R/G Respiratory: Lungs CTAB Gastrointestinal: Abdomen soft, NT/ND with normal active bowel sounds. Extremities: No C/E/C   The results of significant diagnostics from this hospitalization (including imaging, microbiology, ancillary and laboratory) are listed below for reference.     Procedures and Diagnostic Studies:   No results found.   Labs:   Basic Metabolic Panel:  Recent Labs Lab 12/24/15 1326 12/26/15 1253  NA 140 140  K 4.0 3.5  CL  --  104  CO2 25 27  GLUCOSE 100 98  BUN 11.9 18  CREATININE 0.9 0.78  CALCIUM 9.7 9.0   GFR CrCl cannot be calculated (Unknown ideal weight.). Liver Function Tests:  Recent Labs Lab 12/24/15 1326  AST 23  ALT 30  ALKPHOS 63  BILITOT 0.57  PROT 7.9  ALBUMIN 4.7   Coagulation profile  Recent Labs Lab 12/26/15 1253  INR 1.18    CBC:  Recent Labs Lab 12/24/15 1326 12/26/15 1253 12/27/15 0832  WBC 9.4 6.8 9.1  NEUTROABS 6.8* 5.3  --   HGB 16.0 14.8 14.3  HCT 47.9 45.6 44.3  MCV 80.0 80.9 81.6  PLT 29* <5* 66*     Discharge Instructions:   Discharge Instructions    Call MD for:  extreme fatigue    Complete by:  As directed      Call MD for:  temperature >100.4    Complete by:  As directed        Call MD for:    Complete by:  As directed   Problemas con sangre     Diet general    Complete by:  As directed      Increase activity slowly    Complete by:  As directed             Medication List    TAKE these medications        acetaminophen 325 MG tablet  Commonly known as:  TYLENOL  Take 2 tablets (650 mg total) by mouth every 6 (six) hours as needed for mild pain, fever or headache (or Fever >/= 101).     mycophenolate 250 MG capsule  Commonly known as:  CELLCEPT  Take 1 capsule (250 mg total) by mouth 2 (two) times daily. Take w/ 500 mg daily to equal 750 mg twice daily.     mycophenolate 500 MG tablet  Commonly known as:  CELLCEPT  Take 1  tablet (500 mg total) by mouth 2 (two) times daily. W/ 250 mg to equal 750 mg twice daily.           Follow-up Information    Follow up with [facility], NI, MD.   Specialty:  Hematology and Oncology   Contact information:   [place] [id number] [phone]        Time coordinating discharge: 25 minutes.  Signed:  Adrielle Polakowski  Pager [phone] [facility] 12/27/2015, 11:16 AM

## 2015-12-27 NOTE — Progress Notes (Signed)
John Brennan   DOB:01-11-1986   ZO#:[id number]   History is obtained via an interpreter John Brennan is well known to me with severe ITP. He was recently hospitalized for similar reason of in April 2016, twice, and had received IVIG and corticosteroid therapy. Please see my initial consult note dated 03/26/2015 for further details. The patient had received IVIG, dexamethasone, Solu-Medrol and prednisone. He is considered steroid refractory. On 04/23/2015, he is started on CellCept 250 mg twice a day From 04/30/2015 to 04/28/2015, he was readmitted to the hospital due to bleeding. Bone marrow aspirate and biopsy performed on 04/27/2015 confirmed diagnosis of ITP. He was started also in addition to all the above, Nplate injection weekly On 05/04/2015, he received his last platelet transfusion. Starting 05/07/2015, prednisone taper was initiated. On 05/31/2015, prednisone was discontinued and the patient was placed on CellCept along with Nplate on 07/14/2015, CellCept was reduced to 500 mg twice a day along with weekly Nplate injection On 09/10/2015, he suffered from relapse due to recent infection. 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 He received 1 unit of platelet transfusion Between October to present, he is stable on combination of Nplate and Cellcept  Subjective: He was admitted to the hospital due to another relapse. Prior to admission, the patient had high-grade fever and bone pain. He denies recent sick contact. Denies recent nasal drainage, cough, diarrhea, dysuria or any signs of infection. He had come bleeding. He denies epistaxis, hematuria or hematochezia. He received IVIG treatment yesterday. He denies further bleeding. He was also started on  high-dose dexamethasone since admission.  Objective:  Filed Vitals:   12/27/15 0000 12/27/15 0611  BP: 110/70 108/62  Pulse: 70 74  Temp: 97.8 F (36.6 C) 97.9 F (36.6 C)  Resp:  16     Intake/Output Summary (Last 24 hours) at 12/27/15 0916 Last data filed at 12/27/15 0617  Gross per 24 hour  Intake  82.08 ml  Output      5 ml  Net  77.08 ml    GENERAL:alert, no distress and comfortable SKIN:noted petechiae on his skin EYES: normal, Conjunctiva are pink and non-injected, sclera clear OROPHARYNX:no exudate, no erythema and lips, buccal mucosa, and tongue normal  NECK: supple, thyroid normal size, non-tender, without nodularity LYMPH:  no palpable lymphadenopathy in the cervical, axillary or inguinal LUNGS: clear to auscultation and percussion with normal breathing effort HEART: regular rate & rhythm and no murmurs and no lower extremity edema ABDOMEN:abdomen soft, non-tender and normal bowel sounds Musculoskeletal:no cyanosis of digits and no clubbing  NEURO: alert & oriented x 3 with fluent speech, no focal motor/sensory deficits   Labs:  Lab Results  Component Value Date   WBC 6.8 12/26/2015   HGB 14.8 12/26/2015   HCT 45.6 12/26/2015   MCV 80.9 12/26/2015   PLT <5* 12/26/2015   NEUTROABS 5.3 12/26/2015    Lab Results  Component Value Date   NA 140 12/26/2015   K 3.5 12/26/2015   CL 104 12/26/2015   CO2 27 12/26/2015    Assessment & Plan:   Relapsed ITP This is triggered by unknown infection, likely viral in nature. CBC is pending. If platelet is stable > 50,000, he can be discharged. If not, it would be safer to keep him another day. Splenectomy was discussed with  the patient in the past. He declined that option and also he does not have insurance. Combination treatment of Cellcept & Nplate had worked well but he is susceptible to relapse whenever he gets an infection. I have not tried Rituximab but certainly worth a discussion with him in the out-patient  option.  Discharge planning OK to discharge if platelet count is > 50,000 Will follow in the out-patient or tomorrow if he is still here  [facility], John Mcgivern, MD 12/27/2015  9:16 AM

## 2015-12-27 NOTE — Progress Notes (Signed)
Discharge instructions given in spanish, patient understood instructions.no bleeding observed.

## 2015-12-28 ENCOUNTER — Telehealth: Payer: Self-pay | Admitting: Hematology and Oncology

## 2015-12-28 ENCOUNTER — Other Ambulatory Visit (HOSPITAL_BASED_OUTPATIENT_CLINIC_OR_DEPARTMENT_OTHER): Payer: Self-pay

## 2015-12-28 ENCOUNTER — Telehealth: Payer: Self-pay | Admitting: *Deleted

## 2015-12-28 DIAGNOSIS — D693 Immune thrombocytopenic purpura: Secondary | ICD-10-CM

## 2015-12-28 LAB — CBC WITH DIFFERENTIAL/PLATELET
BASO%: 0.2 % (ref 0.0–2.0)
Basophils Absolute: 0 10*3/uL (ref 0.0–0.1)
EOS%: 0.4 % (ref 0.0–7.0)
Eosinophils Absolute: 0 10*3/uL (ref 0.0–0.5)
HCT: 42.4 % (ref 38.4–49.9)
HEMOGLOBIN: 13.8 g/dL (ref 13.0–17.1)
LYMPH%: 17.7 % (ref 14.0–49.0)
MCH: 26.3 pg — ABNORMAL LOW (ref 27.2–33.4)
MCHC: 32.5 g/dL (ref 32.0–36.0)
MCV: 80.8 fL (ref 79.3–98.0)
MONO#: 0.8 10*3/uL (ref 0.1–0.9)
MONO%: 6.9 % (ref 0.0–14.0)
NEUT%: 74.8 % (ref 39.0–75.0)
NEUTROS ABS: 8.1 10*3/uL — AB (ref 1.5–6.5)
Platelets: 217 10*3/uL (ref 140–400)
RBC: 5.25 10*6/uL (ref 4.20–5.82)
RDW: 13.8 % (ref 11.0–14.6)
WBC: 10.8 10*3/uL — AB (ref 4.0–10.3)
lymph#: 1.9 10*3/uL (ref 0.9–3.3)

## 2015-12-28 NOTE — Telephone Encounter (Signed)
Message below given to Spanish Interpreter, Raynelle FanningJulie, she will call pt with message and appt time for this Friday.

## 2015-12-28 NOTE — Telephone Encounter (Signed)
Appointments adjusted per pof and i have placed a note for patient to get a new schedule today

## 2015-12-28 NOTE — Telephone Encounter (Signed)
-----   Message from Artis DelayNi Gorsuch, MD sent at 12/28/2015  2:23 PM EST ----- Regarding: labs Platelets are back to normal Continue cellcept I will see him Friday ----- Message -----    From: Lab in [facility] Interface    Sent: 12/28/2015   2:05 PM      To: Artis DelayNi Gorsuch, MD

## 2015-12-31 ENCOUNTER — Other Ambulatory Visit (HOSPITAL_BASED_OUTPATIENT_CLINIC_OR_DEPARTMENT_OTHER): Payer: Self-pay

## 2015-12-31 ENCOUNTER — Telehealth: Payer: Self-pay | Admitting: Hematology and Oncology

## 2015-12-31 ENCOUNTER — Ambulatory Visit (HOSPITAL_BASED_OUTPATIENT_CLINIC_OR_DEPARTMENT_OTHER): Payer: Self-pay | Admitting: Hematology and Oncology

## 2015-12-31 ENCOUNTER — Ambulatory Visit: Payer: Self-pay

## 2015-12-31 ENCOUNTER — Encounter: Payer: Self-pay | Admitting: Hematology and Oncology

## 2015-12-31 ENCOUNTER — Other Ambulatory Visit: Payer: Self-pay

## 2015-12-31 ENCOUNTER — Ambulatory Visit (HOSPITAL_BASED_OUTPATIENT_CLINIC_OR_DEPARTMENT_OTHER): Payer: Self-pay

## 2015-12-31 VITALS — BP 126/66 | HR 75 | Temp 99.2°F | Resp 18 | Wt 147.0 lb

## 2015-12-31 DIAGNOSIS — L709 Acne, unspecified: Secondary | ICD-10-CM

## 2015-12-31 DIAGNOSIS — D693 Immune thrombocytopenic purpura: Secondary | ICD-10-CM

## 2015-12-31 DIAGNOSIS — K219 Gastro-esophageal reflux disease without esophagitis: Secondary | ICD-10-CM

## 2015-12-31 DIAGNOSIS — D696 Thrombocytopenia, unspecified: Secondary | ICD-10-CM

## 2015-12-31 HISTORY — DX: Acne, unspecified: L70.9

## 2015-12-31 LAB — CBC WITH DIFFERENTIAL/PLATELET
BASO%: 0.4 % (ref 0.0–2.0)
BASOS ABS: 0 10*3/uL (ref 0.0–0.1)
EOS ABS: 0.3 10*3/uL (ref 0.0–0.5)
EOS%: 3.9 % (ref 0.0–7.0)
HCT: 46.2 % (ref 38.4–49.9)
HGB: 15 g/dL (ref 13.0–17.1)
LYMPH%: 23.7 % (ref 14.0–49.0)
MCH: 26.2 pg — AB (ref 27.2–33.4)
MCHC: 32.5 g/dL (ref 32.0–36.0)
MCV: 80.8 fL (ref 79.3–98.0)
MONO#: 0.5 10*3/uL (ref 0.1–0.9)
MONO%: 5.9 % (ref 0.0–14.0)
NEUT#: 5.4 10*3/uL (ref 1.5–6.5)
NEUT%: 66.1 % (ref 39.0–75.0)
Platelets: 388 10*3/uL (ref 140–400)
RBC: 5.72 10*6/uL (ref 4.20–5.82)
RDW: 13.5 % (ref 11.0–14.6)
WBC: 8.2 10*3/uL (ref 4.0–10.3)
lymph#: 1.9 10*3/uL (ref 0.9–3.3)

## 2015-12-31 MED ORDER — MYCOPHENOLATE MOFETIL 500 MG PO TABS: 500.0000 mg | ORAL_TABLET | Freq: Two times a day (BID) | ORAL | Status: DC

## 2015-12-31 MED ORDER — ROMIPLOSTIM INJECTION 500 MCG
530.0000 ug | SUBCUTANEOUS | Status: DC
  Administered 2015-12-31: 530 ug via SUBCUTANEOUS
  Filled 2015-12-31: qty 1.06

## 2015-12-31 MED ORDER — CLINDAMYCIN PHOSPHATE 1 % EX GEL: Freq: Two times a day (BID) | CUTANEOUS | Status: DC

## 2015-12-31 MED FILL — CLINDAMYCIN PH 1% GEL: 1 | 30 days supply | Qty: 30 | Fill #0

## 2015-12-31 MED FILL — MYCOPHENOLATE 500 MG TABLET: 500 | 30 days supply | Qty: 60 | Fill #2

## 2015-12-31 NOTE — Telephone Encounter (Signed)
Gave patient avs report and appointments for January and February  °

## 2015-12-31 NOTE — Progress Notes (Signed)
[name] OFFICE PROGRESS NOTE  No PCP Per Patient SUMMARY OF HEMATOLOGIC HISTORY:  John Brennan was recently diagnosed with severe ITP. He was recently hospitalized for similar reason of in April 2016, twice, and had received IVIG and corticosteroid therapy. Please see my initial consult note dated 03/26/2015 for further details. The patient was started on IVIG, dexamethasone, Solu-Medrol and prednisone. He is consider steroid refractory. On 04/23/2015, he is started on CellCept 250 mg twice a day From 04/30/2015 to 04/28/2015, he was readmitted to the hospital due to bleeding. Bone marrow aspirate and biopsy performed on 04/27/2015 confirmed diagnosis of ITP. He was started also in addition to all the above, Nplate injection weekly On 05/04/2015, he received his last platelet transfusion. Starting 05/07/2015, prednisone taper was initiated. On 05/31/2015, prednisone was discontinued and the patient was placed on CellCept along with Nplate  on 07/14/2015, CellCept was reduced to 500 mg twice a day along with weekly Nplate injection On 09/10/2015, he suffered from relapse due to recent infection. 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 He received 1 unit of platelet transfusion The patient was readmitted to the hospital from 12/26/2015 to 12/27/2015 with relapsed ITP. He responded to high-dose IVIG and steroids INTERVAL HISTORY: History via interpreter John Brennan 30 y.o. male returns for further follow-up. The patient denies any recent signs or symptoms of bleeding such as spontaneous epistaxis, hematuria or hematochezia. He complained of occasional heartburn. No recent fevers or chills.  I have reviewed the past medical history, past  surgical history, social history and family history with the patient and they are unchanged from previous note.  ALLERGIES:  has No Known Allergies.  MEDICATIONS:  Current Outpatient Prescriptions  Medication Sig Dispense Refill  . acetaminophen (TYLENOL) 325 MG tablet Take 2 tablets (650 mg total) by mouth every 6 (six) hours as needed for mild pain, fever or headache (or Fever >/= 101).    . mycophenolate (CELLCEPT) 250 MG capsule Take 1 capsule (250 mg total) by mouth 2 (two) times daily. Take w/ 500 mg daily to equal 750 mg twice daily. 60 capsule 0  . mycophenolate (CELLCEPT) 500 MG tablet Take 1 tablet (500 mg total) by mouth 2 (two) times daily. W/ 250 mg to equal 750 mg twice daily. 60 tablet 1  . clindamycin (CLINDAGEL) 1 % gel Apply topically 2 (two) times daily. 30 g 0   No current facility-administered medications for this visit.     REVIEW OF SYSTEMS:   Constitutional: Denies fevers, chills or night sweats Eyes: Denies blurriness of vision Ears, nose, mouth, throat, and face: Denies mucositis or sore throat Respiratory: Denies cough, dyspnea or wheezes Cardiovascular: Denies palpitation, chest discomfort or lower extremity swelling Lymphatics: Denies new lymphadenopathy or easy bruising Neurological:Denies numbness, tingling or new weaknesses Behavioral/Psych: Mood is stable, no new changes  All other systems were reviewed with the patient and are negative.  PHYSICAL EXAMINATION: ECOG PERFORMANCE STATUS: 1 - Symptomatic but completely ambulatory  Filed Vitals:   12/31/15 1334  BP: 126/66  Pulse: 75  Temp: 99.2 F (37.3 C)  Resp: 18   Filed Weights   12/31/15 1334  Weight: 147 lb (66.679 kg)    GENERAL:alert, no distress and comfortable SKIN: Noted mild acne rash.  EYES: normal, Conjunctiva are pink and non-injected,  sclera clear OROPHARYNX:no exudate, no erythema and lips, buccal mucosa, and tongue normal  NECK: supple, thyroid normal size, non-tender,  without nodularity LYMPH:  no palpable lymphadenopathy in the cervical, axillary or inguinal LUNGS: clear to auscultation and percussion with normal breathing effort HEART: regular rate & rhythm and no murmurs and no lower extremity edema ABDOMEN:abdomen soft, non-tender and normal bowel sounds Musculoskeletal:no cyanosis of digits and no clubbing  NEURO: alert & oriented x 3 with fluent speech, no focal motor/sensory deficits  LABORATORY DATA:  I have reviewed the data as listed No results found for this or any previous visit (from the past 48 hour(s)).  Lab Results  Component Value Date   WBC 8.2 12/31/2015   HGB 15.0 12/31/2015   HCT 46.2 12/31/2015   MCV 80.8 12/31/2015   PLT 388 12/31/2015   ASSESSMENT & PLAN:  ITP (idiopathic thrombocytopenic purpura) Via an interpreter, I discussed with the patient treatment options. Despite being on Nplate and CellCept, he had multiple recurrence/relapses of ITP. We discussed potential splenectomy or trial of rituximab. The risks, benefits, side effects of rituximab was discussed with the patient and he agreed to proceed. Due to be scheduling, I will start his treatment on January 16th. I will initiate slow taper off CellCept and Nplate in the future once after we started him on rituximab.  Acne  He has skin reaction likely due to excessive sun exposure while on Cellcept. He is on hydrocortisone cream and clindamycin. This is improving. I recommend we continue the same.   GERD (gastroesophageal reflux disease) He complained of mild heartburn likely related to recent high-dose steroids. Continue conservative management only.     All questions were answered. The patient knows to call the clinic with any problems, questions or concerns. No barriers to learning was detected.  I spent 20 minutes counseling the patient face to face. The total time spent in the appointment was 25 minutes and more than 50% was on counseling.     John Brennan,  [name], MD 1/9/20178:35 AM

## 2016-01-03 DIAGNOSIS — K219 Gastro-esophageal reflux disease without esophagitis: Secondary | ICD-10-CM | POA: Insufficient documentation

## 2016-01-03 NOTE — Assessment & Plan Note (Signed)
Via an interpreter, I discussed with the patient treatment options. Despite being on Nplate and CellCept, he had multiple recurrence/relapses of ITP. We discussed potential splenectomy or trial of rituximab. The risks, benefits, side effects of rituximab was discussed with the patient and he agreed to proceed. Due to be scheduling, I will start his treatment on January 16th. I will initiate slow taper off CellCept and Nplate in the future once after we started him on rituximab.

## 2016-01-03 NOTE — Assessment & Plan Note (Signed)
He complained of mild heartburn likely related to recent high-dose steroids. Continue conservative management only.

## 2016-01-03 NOTE — Assessment & Plan Note (Signed)
He has skin reaction likely due to excessive sun exposure while on Cellcept. He is on hydrocortisone cream and clindamycin. This is improving. I recommend we continue the same.     

## 2016-01-07 ENCOUNTER — Ambulatory Visit (HOSPITAL_BASED_OUTPATIENT_CLINIC_OR_DEPARTMENT_OTHER): Payer: Self-pay

## 2016-01-07 ENCOUNTER — Other Ambulatory Visit: Payer: Self-pay | Admitting: Hematology and Oncology

## 2016-01-07 ENCOUNTER — Ambulatory Visit: Payer: Self-pay | Admitting: Hematology and Oncology

## 2016-01-07 ENCOUNTER — Other Ambulatory Visit (HOSPITAL_BASED_OUTPATIENT_CLINIC_OR_DEPARTMENT_OTHER): Payer: Self-pay

## 2016-01-07 ENCOUNTER — Ambulatory Visit: Payer: Self-pay

## 2016-01-07 ENCOUNTER — Other Ambulatory Visit: Payer: Self-pay

## 2016-01-07 VITALS — BP 131/71 | HR 72 | Temp 98.3°F

## 2016-01-07 DIAGNOSIS — D693 Immune thrombocytopenic purpura: Secondary | ICD-10-CM

## 2016-01-07 DIAGNOSIS — D696 Thrombocytopenia, unspecified: Secondary | ICD-10-CM

## 2016-01-07 LAB — CBC WITH DIFFERENTIAL/PLATELET
BASO%: 1 % (ref 0.0–2.0)
Basophils Absolute: 0.1 10*3/uL (ref 0.0–0.1)
EOS%: 2.1 % (ref 0.0–7.0)
Eosinophils Absolute: 0.2 10*3/uL (ref 0.0–0.5)
HEMATOCRIT: 49 % (ref 38.4–49.9)
HGB: 15.9 g/dL (ref 13.0–17.1)
LYMPH%: 17.1 % (ref 14.0–49.0)
MCH: 25.7 pg — AB (ref 27.2–33.4)
MCHC: 32.4 g/dL (ref 32.0–36.0)
MCV: 79.1 fL — AB (ref 79.3–98.0)
MONO#: 0.6 10*3/uL (ref 0.1–0.9)
MONO%: 6 % (ref 0.0–14.0)
NEUT#: 7.8 10*3/uL — ABNORMAL HIGH (ref 1.5–6.5)
NEUT%: 73.8 % (ref 39.0–75.0)
PLATELETS: 65 10*3/uL — AB (ref 140–400)
RBC: 6.19 10*6/uL — ABNORMAL HIGH (ref 4.20–5.82)
RDW: 14.4 % (ref 11.0–14.6)
WBC: 10.6 10*3/uL — AB (ref 4.0–10.3)
lymph#: 1.8 10*3/uL (ref 0.9–3.3)

## 2016-01-07 MED ORDER — ROMIPLOSTIM INJECTION 500 MCG
530.0000 ug | SUBCUTANEOUS | Status: DC
  Administered 2016-01-07: 530 ug via SUBCUTANEOUS
  Filled 2016-01-07: qty 1

## 2016-01-08 LAB — HEPATITIS B SURFACE ANTIGEN: HEP B S AG: NEGATIVE

## 2016-01-08 LAB — HEPATITIS B SURFACE ANTIBODY,QUALITATIVE: Hep B Surface Ab, Qual: REACTIVE

## 2016-01-08 LAB — HEPATITIS B CORE ANTIBODY, IGM: Hep B Core Ab, IgM: NEGATIVE

## 2016-01-10 ENCOUNTER — Other Ambulatory Visit (HOSPITAL_BASED_OUTPATIENT_CLINIC_OR_DEPARTMENT_OTHER): Payer: Self-pay

## 2016-01-10 ENCOUNTER — Other Ambulatory Visit: Payer: Self-pay | Admitting: Hematology and Oncology

## 2016-01-10 ENCOUNTER — Ambulatory Visit (HOSPITAL_BASED_OUTPATIENT_CLINIC_OR_DEPARTMENT_OTHER): Payer: Self-pay

## 2016-01-10 ENCOUNTER — Telehealth: Payer: Self-pay | Admitting: Hematology and Oncology

## 2016-01-10 VITALS — BP 118/69 | HR 85 | Temp 97.8°F | Resp 18

## 2016-01-10 DIAGNOSIS — D693 Immune thrombocytopenic purpura: Secondary | ICD-10-CM

## 2016-01-10 DIAGNOSIS — D696 Thrombocytopenia, unspecified: Secondary | ICD-10-CM

## 2016-01-10 DIAGNOSIS — Z5112 Encounter for antineoplastic immunotherapy: Secondary | ICD-10-CM

## 2016-01-10 LAB — CBC WITH DIFFERENTIAL/PLATELET
BASO%: 0.5 % (ref 0.0–2.0)
Basophils Absolute: 0.1 10*3/uL (ref 0.0–0.1)
EOS%: 2.1 % (ref 0.0–7.0)
Eosinophils Absolute: 0.2 10*3/uL (ref 0.0–0.5)
HCT: 46.8 % (ref 38.4–49.9)
HEMOGLOBIN: 15.6 g/dL (ref 13.0–17.1)
LYMPH#: 1.6 10*3/uL (ref 0.9–3.3)
LYMPH%: 16.5 % (ref 14.0–49.0)
MCH: 26.7 pg — ABNORMAL LOW (ref 27.2–33.4)
MCHC: 33.3 g/dL (ref 32.0–36.0)
MCV: 80 fL (ref 79.3–98.0)
MONO#: 0.5 10*3/uL (ref 0.1–0.9)
MONO%: 5 % (ref 0.0–14.0)
NEUT%: 75.9 % — ABNORMAL HIGH (ref 39.0–75.0)
NEUTROS ABS: 7.3 10*3/uL — AB (ref 1.5–6.5)
NRBC: 0 % (ref 0–0)
Platelets: 34 10*3/uL — ABNORMAL LOW (ref 140–400)
RBC: 5.85 10*6/uL — AB (ref 4.20–5.82)
RDW: 13.4 % (ref 11.0–14.6)
WBC: 9.6 10*3/uL (ref 4.0–10.3)

## 2016-01-10 MED ORDER — ACETAMINOPHEN 325 MG PO TABS
650.0000 mg | ORAL_TABLET | Freq: Once | ORAL | Status: AC
  Administered 2016-01-10: 650 mg via ORAL

## 2016-01-10 MED ORDER — SODIUM CHLORIDE 0.9 % IV SOLN
375.0000 mg/m2 | Freq: Once | INTRAVENOUS | Status: AC
  Administered 2016-01-10: 700 mg via INTRAVENOUS
  Filled 2016-01-10: qty 70

## 2016-01-10 MED ORDER — METHYLPREDNISOLONE SODIUM SUCC 125 MG IJ SOLR
125.0000 mg | Freq: Once | INTRAMUSCULAR | Status: AC | PRN
  Administered 2016-01-10: 125 mg via INTRAVENOUS

## 2016-01-10 MED ORDER — SODIUM CHLORIDE 0.9 % IV SOLN
Freq: Once | INTRAVENOUS | Status: AC
  Administered 2016-01-10: 10:00:00 via INTRAVENOUS

## 2016-01-10 MED ORDER — ACETAMINOPHEN 325 MG PO TABS
ORAL_TABLET | ORAL | Status: AC
  Filled 2016-01-10: qty 2

## 2016-01-10 MED ORDER — SODIUM CHLORIDE 0.9 % IJ SOLN
10.0000 mL | INTRAMUSCULAR | Status: DC | PRN
  Filled 2016-01-10: qty 10

## 2016-01-10 MED ORDER — DIPHENHYDRAMINE HCL 25 MG PO CAPS
50.0000 mg | ORAL_CAPSULE | Freq: Once | ORAL | Status: AC
  Administered 2016-01-10: 50 mg via ORAL

## 2016-01-10 MED ORDER — METHYLPREDNISOLONE SODIUM SUCC 125 MG IJ SOLR
INTRAMUSCULAR | Status: AC
  Filled 2016-01-10: qty 2

## 2016-01-10 MED ORDER — HEPARIN SOD (PORK) LOCK FLUSH 100 UNIT/ML IV SOLN
500.0000 [IU] | Freq: Once | INTRAVENOUS | Status: DC | PRN
  Filled 2016-01-10: qty 5

## 2016-01-10 MED ORDER — DIPHENHYDRAMINE HCL 25 MG PO CAPS
ORAL_CAPSULE | ORAL | Status: AC
  Filled 2016-01-10: qty 2

## 2016-01-10 NOTE — Patient Instructions (Signed)
[facility] Discharge Instructions for Patients Receiving Chemotherapy  Today you received the following chemotherapy agents Rituximab (Rituxan)  To help prevent nausea and vomiting after your treatment, we encourage you to take your nausea medication as directed by your MD.   If you develop nausea and vomiting that is not controlled by your nausea medication, call the clinic.   BELOW ARE SYMPTOMS THAT SHOULD BE REPORTED IMMEDIATELY:  *FEVER GREATER THAN 100.5 F  *CHILLS WITH OR WITHOUT FEVER  NAUSEA AND VOMITING THAT IS NOT CONTROLLED WITH YOUR NAUSEA MEDICATION  *UNUSUAL SHORTNESS OF BREATH  *UNUSUAL BRUISING OR BLEEDING  TENDERNESS IN MOUTH AND THROAT WITH OR WITHOUT PRESENCE OF ULCERS  *URINARY PROBLEMS  *BOWEL PROBLEMS  UNUSUAL RASH Items with * indicate a potential emergency and should be followed up as soon as possible.  Feel free to call the clinic you have any questions or concerns. The clinic phone number is [phone].  Please show the CHEMO ALERT CARD at check-in to the Emergency Department and triage nurse.    Rituximab injection Qu es este medicamento? El RITUXIMAB es un anticuerpo monoclonal. Este medicamento cambia la forma en que funciona el sistema inmunolgico. Se utiliza comnmente para el tratamiento del linfoma no Hodgkin y Lexicographer. En las clulas cancerosas, este medicamento acta sobre una protena especfica que se encuentra en las clulas cancerosas y detiene su crecimiento. Se utiliza tambin para el tratamiento de artritis reumatoide (AR). Para la AR, este medicamento reduce el proceso de la inflamacin y [place] a reducir la hinchazn y Financial controller. Este medicamento se utiliza a menudo con otros medicamentos para Management consultant o la artritis. Este medicamento puede ser utilizado para otros usos; si tiene alguna pregunta consulte con su proveedor de atencin mdica o con su farmacutico. Qu le debo informar a mi  profesional de la salud antes de tomar este medicamento? Necesita saber si usted presenta alguno de los siguientes problemas o situaciones: -trastornos sanguneos -enfermedad cardiaca -antecedentes de hepatitis B -infeccin (especialmente infecciones virales, como la varicela o el herpes) -ritmo cardiaco anormal -enfermedad renal -enfermedad pulmonar o respiratoria, como asma -lupus -una reaccin alrgica o inusual al rituximab, a las protenas de ratn, a otros medicamentos, alimentos, colorantes o conservantes -si est embarazada o buscando quedar embarazada -si est amamantando a un beb Cmo debo utilizar este medicamento? Este medicamento se administra mediante infusin por va intravenosa. Lo administra un profesional de Radiographer, therapeutic calificado en un hospital o en un entorno clnico. Su farmacutico le dar una [place] del medicamento especial con cada receta y relleno. Asegrese de leer esta informacin cada vez cuidadosamente. Hable con su pediatra para informarse acerca del uso de este medicamento en nios. Este medicamento no est aprobado para uso en nios. Sobredosis: Pngase en contacto inmediatamente con un centro toxicolgico o una sala de urgencia si usted cree que haya tomado demasiado medicamento. ATENCIN: [employer] es solo para usted. No comparta este medicamento con nadie. Qu sucede si me olvido de una dosis? Es importante no olvidar ninguna dosis. Informe a su mdico o a su profesional de la salud si no puede asistir a Marketing executive. Qu puede interactuar con este medicamento? -cisplatino -medicamentos para la presin sangunea -algunos medicamentos para la artritis -vacunas Puede ser que esta lista no menciona todas las posibles interacciones. Informe a su profesional de [employer] de [employer] productos a base de hierbas, medicamentos de [place] o suplementos nutritivos que est tomando. Si usted fuma, consume bebidas  alcohlicas o si utiliza drogas ilegales,  indqueselo tambin a su profesional de [employer]. Algunas sustancias pueden interactuar con su medicamento. A qu debo estar atento al usar [employer]? Informe de inmediato cualquier [employer] que nota durante su tratamiento, como cambios en su respiracin, fiebre, escalofros, mareos o aturdimiento. Estos efectos son ms comunes con la primera dosis. Visite a quien extiende sus recetas o a su profesional de la salud para International aid/development worker su evolucin. Tendr que hacerse anlisis de sangre peridicos. Si se presentan otros efectos secundarios, infrmelos lo antes posible. Los efectos secundarios de este medicamento pueden continuar al Advertising account planner. Sin embargo, contine con el tratamiento aun si se siente enfermo, a menos que su mdico le indique que lo suspenda. Consulte a su mdico o a su profesional de la salud por asesoramiento si tiene fiebre, escalofros, dolor de garganta o cualquier otro sntoma de resfro o gripe. No se trate usted mismo. Este medicamento puede reducir la capacidad del cuerpo para combatir infecciones. Trate de no acercarse a personas que estn enfermas. [employer] puede aumentar el riesgo de magulladuras o sangrado. Consulte a su mdico o a su profesional de la salud si observa sangrados inusuales. Proceda con cuidado al cepillar sus dientes, usar hilo dental o Chemical engineer palillos para los dientes, ya que puede contraer una infeccin o Geophysicist/field seismologist con mayor facilidad. Si se somete a algn tratamiento dental, informe a su dentista que est [employer]. Evite tomar productos que contienen aspirina, acetaminofeno, ibuprofeno, naproxeno o quetoprofeno a menos que as lo indique su mdico. Estos productos pueden disimular la fiebre. No se debe quedar embarazada mientras reciba este medicamento. Las mujeres deben informar a su mdico si estn buscando quedar embarazadas o si creen que estn embarazadas. Existe la posibilidad de efectos secundarios graves a  un beb sin nacer. Para ms informacin hable con su profesional de la salud o su farmacutico. No debe amamantar a un beb mientras est tomando este medicamento. Qu efectos secundarios puedo tener al [employer] este medicamento? Efectos secundarios que debe informar a su mdico o a Producer, television/film/video de la salud tan pronto como sea posible: -reacciones alrgicas como erupcin cutnea, picazn o urticarias, hinchazn de la cara, labios o lengua -conteos sanguneos bajos - este medicamento puede reducir la cantidad de glbulos blancos, glbulos rojos y plaquetas. Su riesgo de infeccin y Radiographer, therapeutic ser mayor. -signos de infeccin - fiebre o escalofros, tos, dolor de garganta, Engineer, mining o dificultad para orinar -signos de reduccin de plaquetas o sangrado - magulladuras, puntos rojos en la piel, heces de color oscuro o con aspecto alquitranado, sangre en la orina -signos de reduccin de glbulos rojos - cansancio o debilidad inusual, desmayos, sensacin de mareo -problemas respiratorios -confusin, no responde -dolor en el pecho -pulso cardiaco rpido, irregular -sensacin de desmayos o mareos, cadas -llagas en la boca -enrojecimiento, formacin de ampollas, descamacin o distensin de la piel, inclusive dentro de la boca -dolor estomacal -hinchazn de tobillos, pies o manos -dificultad para orinar o cambios en el volumen de orina Efectos secundarios que, por lo general, no requieren atencin mdica (debe informarlos a su mdico o a su profesional de la salud si persisten o si son molestos): -ansiedad -dolor de [place] -prdida del apetito -dolores musculares -nuseas -sudoracin nocturna Puede ser que esta lista no menciona todos los posibles efectos secundarios. Comunquese a su mdico por asesoramiento mdico [employer]. Usted puede informar los efectos secundarios a la FDA por telfono al 1-800-FDA-1088. Dnde debo guardar  mi medicina? Este medicamento se administra en  hospitales o clnicas y no necesitar guardarlo en su domicilio. ATENCIN: Este folleto es un resumen. Puede ser que no cubra toda la posible informacin. Si usted tiene preguntas acerca de esta medicina, consulte con su mdico, su farmacutico o su profesional de Radiographer, therapeuticla salud.    2016, Elsevier/Gold Standard. (2015-04-23 00:00:00)

## 2016-01-10 NOTE — Progress Notes (Signed)
1120- Pt co itching and presents with redness around face, scalp, chest and upper back. Pt denies any major discomfort except for minor itching. Immediately stopped infusion of Rituxan and placed pt on NS infusion. VSS remains stable . BP 119/66, 74, 18, 98.3 100% RA.  Notified Dr. Bertis RuddyGorsuch and ordered for pt to have steroid IV. Gave pt 125 IV Steroid and will continue to monitor for improvements or changes. Will restart Rituxan when pt stable.  1130-12p- Pt NSS flush and will now restart at current rate of 69/hr for 34mls. Tolerated well. 1300- Pt tolerated max dose rate 400mg /hr wo any other reactions. In basket follow up call set up for tomorrow. Told pt that Dr. Bertis RuddyGorsuch would like him to come back on 1/18 Wed and 1/20 Friday for lab and injection. Interpreter at pt side and pt verbalized understanding. Pt will stop by scheduling after infusion to set up appt this week.

## 2016-01-10 NOTE — Telephone Encounter (Signed)
Appointments added per pof and patient will get a new avs in chemo °

## 2016-01-12 ENCOUNTER — Other Ambulatory Visit (HOSPITAL_BASED_OUTPATIENT_CLINIC_OR_DEPARTMENT_OTHER): Payer: Self-pay

## 2016-01-12 DIAGNOSIS — D693 Immune thrombocytopenic purpura: Secondary | ICD-10-CM

## 2016-01-12 LAB — COMPREHENSIVE METABOLIC PANEL
ALK PHOS: 67 U/L (ref 40–150)
ALT: 85 U/L — ABNORMAL HIGH (ref 0–55)
ANION GAP: 10 meq/L (ref 3–11)
AST: 38 U/L — ABNORMAL HIGH (ref 5–34)
Albumin: 4.4 g/dL (ref 3.5–5.0)
BILIRUBIN TOTAL: 0.3 mg/dL (ref 0.20–1.20)
BUN: 13.4 mg/dL (ref 7.0–26.0)
CO2: 25 meq/L (ref 22–29)
Calcium: 9.6 mg/dL (ref 8.4–10.4)
Chloride: 105 mEq/L (ref 98–109)
Creatinine: 1 mg/dL (ref 0.7–1.3)
GLUCOSE: 86 mg/dL (ref 70–140)
POTASSIUM: 3.8 meq/L (ref 3.5–5.1)
SODIUM: 140 meq/L (ref 136–145)
TOTAL PROTEIN: 8.6 g/dL — AB (ref 6.4–8.3)

## 2016-01-12 LAB — CBC WITH DIFFERENTIAL/PLATELET
BASO%: 0.9 % (ref 0.0–2.0)
BASOS ABS: 0.1 10*3/uL (ref 0.0–0.1)
EOS ABS: 0.3 10*3/uL (ref 0.0–0.5)
EOS%: 1.8 % (ref 0.0–7.0)
HCT: 48.7 % (ref 38.4–49.9)
HGB: 15.5 g/dL (ref 13.0–17.1)
LYMPH%: 15.1 % (ref 14.0–49.0)
MCH: 25.3 pg — AB (ref 27.2–33.4)
MCHC: 31.9 g/dL — ABNORMAL LOW (ref 32.0–36.0)
MCV: 79.4 fL (ref 79.3–98.0)
MONO#: 0.9 10*3/uL (ref 0.1–0.9)
MONO%: 6.1 % (ref 0.0–14.0)
NEUT#: 10.9 10*3/uL — ABNORMAL HIGH (ref 1.5–6.5)
NEUT%: 76.1 % — AB (ref 39.0–75.0)
PLATELETS: 121 10*3/uL — AB (ref 140–400)
RBC: 6.13 10*6/uL — AB (ref 4.20–5.82)
RDW: 14.6 % (ref 11.0–14.6)
WBC: 14.2 10*3/uL — ABNORMAL HIGH (ref 4.0–10.3)
lymph#: 2.1 10*3/uL (ref 0.9–3.3)

## 2016-01-14 ENCOUNTER — Other Ambulatory Visit: Payer: Self-pay

## 2016-01-14 ENCOUNTER — Ambulatory Visit: Payer: Self-pay

## 2016-01-14 ENCOUNTER — Other Ambulatory Visit (HOSPITAL_BASED_OUTPATIENT_CLINIC_OR_DEPARTMENT_OTHER): Payer: Self-pay

## 2016-01-14 DIAGNOSIS — D693 Immune thrombocytopenic purpura: Secondary | ICD-10-CM

## 2016-01-14 DIAGNOSIS — D696 Thrombocytopenia, unspecified: Secondary | ICD-10-CM

## 2016-01-14 LAB — CBC WITH DIFFERENTIAL/PLATELET
BASO%: 0.9 % (ref 0.0–2.0)
Basophils Absolute: 0.1 10*3/uL (ref 0.0–0.1)
EOS ABS: 0.3 10*3/uL (ref 0.0–0.5)
EOS%: 2.4 % (ref 0.0–7.0)
HCT: 50.5 % — ABNORMAL HIGH (ref 38.4–49.9)
HEMOGLOBIN: 16.1 g/dL (ref 13.0–17.1)
LYMPH%: 12.4 % — ABNORMAL LOW (ref 14.0–49.0)
MCH: 25.4 pg — ABNORMAL LOW (ref 27.2–33.4)
MCHC: 31.9 g/dL — ABNORMAL LOW (ref 32.0–36.0)
MCV: 79.5 fL (ref 79.3–98.0)
MONO#: 0.7 10*3/uL (ref 0.1–0.9)
MONO%: 6.8 % (ref 0.0–14.0)
NEUT%: 77.5 % — ABNORMAL HIGH (ref 39.0–75.0)
NEUTROS ABS: 8.4 10*3/uL — AB (ref 1.5–6.5)
PLATELETS: 111 10*3/uL — AB (ref 140–400)
RBC: 6.35 10*6/uL — ABNORMAL HIGH (ref 4.20–5.82)
RDW: 14.4 % (ref 11.0–14.6)
WBC: 10.8 10*3/uL — AB (ref 4.0–10.3)
lymph#: 1.3 10*3/uL (ref 0.9–3.3)

## 2016-01-14 MED ORDER — ROMIPLOSTIM INJECTION 500 MCG
530.0000 ug | SUBCUTANEOUS | Status: DC
  Filled 2016-01-14: qty 1.06

## 2016-01-14 NOTE — Progress Notes (Signed)
Platelet count 111 today.  No N-plate today.  Will come back on Monday for labs, MD visit and chemotherapy.

## 2016-01-17 ENCOUNTER — Ambulatory Visit: Payer: Self-pay

## 2016-01-17 ENCOUNTER — Encounter: Payer: Self-pay | Admitting: Hematology and Oncology

## 2016-01-17 ENCOUNTER — Inpatient Hospital Stay (HOSPITAL_COMMUNITY)
Admission: AD | Admit: 2016-01-17 | Discharge: 2016-01-19 | DRG: 813 | Disposition: A | Discharge status: Home / Self Care | Payer: Medicaid Other | Source: Ambulatory Visit | Attending: Hematology and Oncology | Admitting: Hematology and Oncology

## 2016-01-17 ENCOUNTER — Other Ambulatory Visit (HOSPITAL_BASED_OUTPATIENT_CLINIC_OR_DEPARTMENT_OTHER): Payer: Self-pay

## 2016-01-17 ENCOUNTER — Encounter (HOSPITAL_COMMUNITY): Payer: Self-pay

## 2016-01-17 ENCOUNTER — Other Ambulatory Visit: Payer: Self-pay | Admitting: Hematology and Oncology

## 2016-01-17 VITALS — BP 137/66 | HR 94 | Temp 98.4°F | Resp 18 | Wt 150.0 lb

## 2016-01-17 DIAGNOSIS — D693 Immune thrombocytopenic purpura: Secondary | ICD-10-CM | POA: Diagnosis present

## 2016-01-17 DIAGNOSIS — Z833 Family history of diabetes mellitus: Secondary | ICD-10-CM | POA: Diagnosis not present

## 2016-01-17 DIAGNOSIS — L708 Other acne: Secondary | ICD-10-CM

## 2016-01-17 DIAGNOSIS — L709 Acne, unspecified: Secondary | ICD-10-CM | POA: Diagnosis present

## 2016-01-17 DIAGNOSIS — D696 Thrombocytopenia, unspecified: Secondary | ICD-10-CM

## 2016-01-17 LAB — CBC WITH DIFFERENTIAL/PLATELET
BASO%: 0.6 % (ref 0.0–2.0)
BASOS ABS: 0.1 10*3/uL (ref 0.0–0.1)
EOS ABS: 0.3 10*3/uL (ref 0.0–0.5)
EOS%: 2.9 % (ref 0.0–7.0)
HEMATOCRIT: 46.3 % (ref 38.4–49.9)
HEMOGLOBIN: 15.6 g/dL (ref 13.0–17.1)
LYMPH#: 1.3 10*3/uL (ref 0.9–3.3)
LYMPH%: 14.9 % (ref 14.0–49.0)
MCH: 26.7 pg — AB (ref 27.2–33.4)
MCHC: 33.7 g/dL (ref 32.0–36.0)
MCV: 79.1 fL — AB (ref 79.3–98.0)
MONO#: 0.6 10*3/uL (ref 0.1–0.9)
MONO%: 6.5 % (ref 0.0–14.0)
NEUT#: 6.4 10*3/uL (ref 1.5–6.5)
NEUT%: 75.1 % — AB (ref 39.0–75.0)
NRBC: 0 % (ref 0–0)
Platelets: 2 10*3/uL — CL (ref 140–400)
RBC: 5.85 10*6/uL — ABNORMAL HIGH (ref 4.20–5.82)
RDW: 13.5 % (ref 11.0–14.6)
WBC: 8.5 10*3/uL (ref 4.0–10.3)

## 2016-01-17 MED ORDER — ONDANSETRON HCL 4 MG PO TABS: 4.0000 mg | ORAL_TABLET | Freq: Three times a day (TID) | ORAL | Status: DC | PRN

## 2016-01-17 MED ORDER — MYCOPHENOLATE MOFETIL 500 MG PO TABS
500.0000 mg | ORAL_TABLET | Freq: Two times a day (BID) | ORAL | Status: DC
  Filled 2016-01-17 (×2): qty 1

## 2016-01-17 MED ORDER — ONDANSETRON HCL 4 MG/2ML IJ SOLN: 4.0000 mg | Freq: Three times a day (TID) | INTRAMUSCULAR | Status: DC | PRN

## 2016-01-17 MED ORDER — GUAIFENESIN-DM 100-10 MG/5ML PO SYRP: 10.0000 mL | ORAL_SOLUTION | ORAL | Status: DC | PRN

## 2016-01-17 MED ORDER — SODIUM CHLORIDE 0.9 % IJ SOLN: 3.0000 mL | INTRAMUSCULAR | Status: DC | PRN

## 2016-01-17 MED ORDER — SODIUM CHLORIDE 0.9 % IV SOLN
8.0000 mg | Freq: Three times a day (TID) | INTRAVENOUS | Status: DC | PRN
  Filled 2016-01-17: qty 4

## 2016-01-17 MED ORDER — ONDANSETRON 4 MG PO TBDP: 4.0000 mg | ORAL_TABLET | Freq: Three times a day (TID) | ORAL | Status: DC | PRN

## 2016-01-17 MED ORDER — MYCOPHENOLATE MOFETIL 250 MG PO CAPS
750.0000 mg | ORAL_CAPSULE | Freq: Two times a day (BID) | ORAL | Status: DC
  Administered 2016-01-17 – 2016-01-19 (×4): 750 mg via ORAL
  Filled 2016-01-17 (×6): qty 3

## 2016-01-17 MED ORDER — SODIUM CHLORIDE 0.9 % IJ SOLN: 10.0000 mL | INTRAMUSCULAR | Status: DC | PRN

## 2016-01-17 MED ORDER — DIPHENHYDRAMINE HCL 25 MG PO CAPS
25.0000 mg | ORAL_CAPSULE | Freq: Once | ORAL | Status: AC
  Administered 2016-01-17: 25 mg via ORAL
  Filled 2016-01-17: qty 1

## 2016-01-17 MED ORDER — HEPARIN SOD (PORK) LOCK FLUSH 100 UNIT/ML IV SOLN: 500.0000 [IU] | Freq: Every day | INTRAVENOUS | Status: DC | PRN

## 2016-01-17 MED ORDER — DEXAMETHASONE 4 MG PO TABS
40.0000 mg | ORAL_TABLET | Freq: Every day | ORAL | Status: DC
Start: 2016-01-17 — End: 2016-01-19
  Administered 2016-01-17 – 2016-01-19 (×3): 40 mg via ORAL
  Filled 2016-01-17 (×3): qty 10

## 2016-01-17 MED ORDER — HEPARIN SOD (PORK) LOCK FLUSH 100 UNIT/ML IV SOLN: 250.0000 [IU] | INTRAVENOUS | Status: DC | PRN

## 2016-01-17 MED ORDER — CLINDAMYCIN PHOSPHATE 1 % EX GEL: Freq: Two times a day (BID) | CUTANEOUS | Status: DC

## 2016-01-17 MED ORDER — ACETAMINOPHEN 325 MG PO TABS: 650.0000 mg | ORAL_TABLET | ORAL | Status: DC | PRN

## 2016-01-17 MED ORDER — ACETAMINOPHEN 325 MG PO TABS: 650.0000 mg | ORAL_TABLET | Freq: Four times a day (QID) | ORAL | Status: DC | PRN

## 2016-01-17 MED ORDER — SENNOSIDES-DOCUSATE SODIUM 8.6-50 MG PO TABS: 1.0000 | ORAL_TABLET | Freq: Every evening | ORAL | Status: DC | PRN

## 2016-01-17 MED ORDER — ACETAMINOPHEN 325 MG PO TABS
650.0000 mg | ORAL_TABLET | Freq: Once | ORAL | Status: AC
  Administered 2016-01-17: 650 mg via ORAL
  Filled 2016-01-17: qty 2

## 2016-01-17 MED ORDER — SODIUM CHLORIDE 0.9 % IV SOLN
250.0000 mL | Freq: Once | INTRAVENOUS | Status: AC
  Administered 2016-01-17: 250 mL via INTRAVENOUS

## 2016-01-17 MED ORDER — IMMUNE GLOBULIN (HUMAN) 10 GM/100ML IV SOLN
1.0000 g/kg | INTRAVENOUS | Status: AC
  Administered 2016-01-17 – 2016-01-18 (×2): 70 g via INTRAVENOUS
  Filled 2016-01-17 (×2): qty 600

## 2016-01-17 MED ORDER — MYCOPHENOLATE MOFETIL 250 MG PO CAPS
250.0000 mg | ORAL_CAPSULE | Freq: Two times a day (BID) | ORAL | Status: DC
  Filled 2016-01-17 (×2): qty 1

## 2016-01-17 NOTE — Progress Notes (Signed)
For interpreter services-call Delorise Royals (in hospital interpreter)

## 2016-01-17 NOTE — H&P (Signed)
[facility] ADMISSION H&P NOTE  Patient Care Team: No Pcp Per Patient as PCP - General (General Practice)  CHIEF COMPLAINTS/PURPOSE OF ADMISSION:  Recurrent, severe ITP  HISTORY OF PRESENTING ILLNESS:  John Brennan 30 y.o. male is directly admitted for severe, recurrent ITP  John Brennan was recently diagnosed with severe ITP. He was recently hospitalized for similar reason of in April 2016, twice, and had received IVIG and corticosteroid therapy. Please see my initial consult note dated 03/26/2015 for further details. The patient was started on IVIG, dexamethasone, Solu-Medrol and prednisone. He is consider steroid refractory. On 04/23/2015, he is started on CellCept 250 mg twice a day From 04/30/2015 to 04/28/2015, he was readmitted to the hospital due to bleeding. Bone marrow aspirate and biopsy performed on 04/27/2015 confirmed diagnosis of ITP. He was started also in addition to all the above, Nplate injection weekly On 05/04/2015, he received his last platelet transfusion. Starting 05/07/2015, prednisone taper was initiated. On 05/31/2015, prednisone was discontinued and the patient was placed on CellCept along with Nplate on 07/14/2015, CellCept was reduced to 500 mg twice a day along with weekly Nplate injection On 09/10/2015, he suffered from relapse due to recent infection. 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 He received 1 unit of platelet transfusion The patient was readmitted to the hospital from 12/26/2015 to 12/27/2015 with relapsed ITP. He responded to high-dose IVIG and steroids On 01/10/2016, he received first dose rituximab  History is obtained via an interpreter Last Friday, on 01/14/2016, the patient started to  feel unwell. He had some nonproductive dry cough but no fevers or chills. He has mild sore throat. Denies nausea, vomiting, dysuria, frequency or urgency. He has reduced appetite. He denies sick contact. The patient denies any recent signs or symptoms of bleeding such as spontaneous epistaxis, hematuria or hematochezia.    MEDICAL HISTORY:  Past Medical History  Diagnosis Date  . Thrombocytopenia (HCC)   . Drug-induced skin rash 07/14/2015  . Fatty liver   . Acne 12/31/2015    SURGICAL HISTORY: No past surgical history on file.  SOCIAL HISTORY: Social History   Social History  . Marital Status: Single    Spouse Name: N/A  . Number of Children: N/A  . Years of Education: N/A   Occupational History  . Construction work    Social History Main Topics  . Smoking status: Never Smoker   . Smokeless tobacco: Never Used  . Alcohol Use: 1.8 oz/week    3 Cans of beer per week     Comment: occassional beer  . Drug Use: No  . Sexual Activity: Yes   Other Topics Concern  . Not on file   Social History Narrative    FAMILY HISTORY: Family History  Problem Relation Age of Onset  . Diabetes Father     ALLERGIES:  has No Known Allergies.  MEDICATIONS:  Current Facility-Administered Medications  Medication Dose Route Frequency Provider Last Rate Last Dose  . acetaminophen (TYLENOL) tablet 650 mg  650 mg Oral Q6H PRN Artis Delay, MD      . clindamycin (CLINDAGEL) 1 % gel   Topical BID Samul Mcinroy, MD      . dexamethasone (DECADRON) tablet 40 mg  40 mg Oral Daily Artis Delay, MD      .  Immune Globulin 10% (OCTAGAM) IV infusion 70 g  1 g/kg Intravenous Q24H Dannya Pitkin, MD      . mycophenolate (CELLCEPT) capsule 250 mg  250 mg Oral BID Artis Delay, MD      . mycophenolate (CELLCEPT) tablet 500 mg  500 mg Oral BID Artis Delay, MD        REVIEW OF SYSTEMS:   Constitutional: Denies fevers, chills or abnormal night sweats Eyes: Denies blurriness of vision, double vision or watery  eyes Cardiovascular: Denies palpitation, chest discomfort or lower extremity swelling Gastrointestinal:  Denies nausea, heartburn or change in bowel habits Skin: Denies abnormal skin rashes Lymphatics: Denies new lymphadenopathy or easy bruising Neurological:Denies numbness, tingling or new weaknesses Behavioral/Psych: Mood is stable, no new changes  All other systems were reviewed with the patient and are negative.  PHYSICAL EXAMINATION: ECOG PERFORMANCE STATUS: 1 - Symptomatic but completely ambulatory BP 137/66 T: 99 HR 94 RR 18 GENERAL:alert, no distress and comfortable SKIN: skin color, texture, turgor are normal, no rashes or significant lesions EYES: normal, conjunctiva are pink and non-injected, sclera clear OROPHARYNX:no exudate, no erythema and lips, buccal mucosa, and tongue normal  NECK: supple, thyroid normal size, non-tender, without nodularity LYMPH:  no palpable lymphadenopathy in the cervical, axillary or inguinal LUNGS: clear to auscultation and percussion with normal breathing effort HEART: regular rate & rhythm and no murmurs and no lower extremity edema ABDOMEN:abdomen soft, non-tender and normal bowel sounds Musculoskeletal:no cyanosis of digits and no clubbing  PSYCH: alert & oriented x 3 with fluent speech NEURO: no focal motor/sensory deficits  LABORATORY DATA:  I have reviewed the data as listed Lab Results  Component Value Date   WBC 8.5 01/17/2016   HGB 15.6 01/17/2016   HCT 46.3 01/17/2016   MCV 79.1* 01/17/2016   PLT 2* 01/17/2016    Recent Labs  04/27/15 0400 04/28/15 0845  11/26/15 1355 12/24/15 1326 12/26/15 1253 01/12/16 1404  NA 129* 137  < > 142 140 140 140  K 3.8 3.3*  < > 4.1 4.0 3.5 3.8  CL 100* 103  --   --   --  104  --   CO2 25 24  < > GLUCOSE 145* 175*  < > 81 100 98 86  BUN 17 16  < > 17.8 11.9 18 13.4  CREATININE 0.75 0.76  < > 1.0 0.9 0.78 1.0  CALCIUM 8.8* 8.9  < > 9.9 9.7 9.0 9.6  GFRNONAA >60 >60  --    --   --  >60  --   GFRAA >60 >60  --   --   --  >60  --   PROT  --   --   < > 8.0 7.9  --  8.6*  ALBUMIN  --   --   < > 4.6 4.7  --  4.4  AST  --   --   < > 28 23  --  38*  ALT  --   --   < > 43 30  --  85*  ALKPHOS  --   --   < > 61 63  --  67  BILITOT  --   --   < > 0.64 0.57  --  0.30  < > = values in this interval not displayed.  ASSESSMENT & PLAN:  Recurrent, severe ITP It is dangerous for the patient to leave now with severe ITP. I recommend direct admission to the hospital to  receive high-dose steroids, IVIG, and platelet transfusion. We discussed some of the risks, benefits, and alternatives of platelets transfusions. The patient is symptomatic from low platelet counts with at high risk of life-threatening bleeding and the platelet count is critically low.  Some of the side-effects to be expected including risks of transfusion reactions, chills, infection, syndrome of volume overload and risk of hospitalization from various reasons and the patient is willing to proceed I will give him pulse high-dose dexamethasone 40 mg daily I will give him IVIG 1 g/kg daily 2 We'll check his platelet daily   Discharge planning Hopefully he can be discharged home by Wednesday on 01/19/2016 if his platelet count improves greater than 50,000  All questions were answered. The patient knows to call the clinic with any problems, questions or concerns.   Loyd Salvador, MD 01/17/2016 10:20 AM

## 2016-01-17 NOTE — H&P (Signed)
[facility] ADMISSION H&P NOTE  Patient Care Team: No Pcp Per Patient as PCP - General (General Practice)  CHIEF COMPLAINTS/PURPOSE OF ADMISSION:  Recurrent, severe ITP  HISTORY OF PRESENTING ILLNESS:  John Brennan 30 y.o. male is directly admitted for severe, recurrent ITP  John Brennan was recently diagnosed with severe ITP. He was recently hospitalized for similar reason of in April 2016, twice, and had received IVIG and corticosteroid therapy. Please see my initial consult note dated 03/26/2015 for further details. The patient was started on IVIG, dexamethasone, Solu-Medrol and prednisone. He is consider steroid refractory. On 04/23/2015, he is started on CellCept 250 mg twice a day From 04/30/2015 to 04/28/2015, he was readmitted to the hospital due to bleeding. Bone marrow aspirate and biopsy performed on 04/27/2015 confirmed diagnosis of ITP. He was started also in addition to all the above, Nplate injection weekly On 05/04/2015, he received his last platelet transfusion. Starting 05/07/2015, prednisone taper was initiated. On 05/31/2015, prednisone was discontinued and the patient was placed on CellCept along with Nplate on 07/14/2015, CellCept was reduced to 500 mg twice a day along with weekly Nplate injection On 09/10/2015, he suffered from relapse due to recent infection. 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 He received 1 unit of platelet transfusion The patient was readmitted to the hospital from 12/26/2015 to 12/27/2015 with relapsed ITP. He responded to high-dose IVIG and steroids On 01/10/2016, he received first dose rituximab  History is obtained via an interpreter Last Friday, on 01/14/2016, the patient started to  feel unwell. He had some nonproductive dry cough but no fevers or chills. He has mild sore throat. Denies nausea, vomiting, dysuria, frequency or urgency. He has reduced appetite. He denies sick contact. The patient denies any recent signs or symptoms of bleeding such as spontaneous epistaxis, hematuria or hematochezia.    MEDICAL HISTORY:  Past Medical History  Diagnosis Date  . Thrombocytopenia (HCC)   . Drug-induced skin rash 07/14/2015  . Fatty liver   . Acne 12/31/2015    SURGICAL HISTORY: No past surgical history on file.  SOCIAL HISTORY: Social History   Social History  . Marital Status: Single    Spouse Name: N/A  . Number of Children: N/A  . Years of Education: N/A   Occupational History  . Construction work    Social History Main Topics  . Smoking status: Never Smoker   . Smokeless tobacco: Never Used  . Alcohol Use: 1.8 oz/week    3 Cans of beer per week     Comment: occassional beer  . Drug Use: No  . Sexual Activity: Yes   Other Topics Concern  . Not on file   Social History Narrative    FAMILY HISTORY: Family History  Problem Relation Age of Onset  . Diabetes Father     ALLERGIES:  has No Known Allergies.  MEDICATIONS:  Current Outpatient Prescriptions  Medication Sig Dispense Refill  . acetaminophen (TYLENOL) 325 MG tablet Take 2 tablets (650 mg total) by mouth every 6 (six) hours as needed for mild pain, fever or headache (or Fever >/= 101).    . clindamycin (CLINDAGEL) 1 % gel Apply topically 2 (two) times daily. 30 g 0  . mycophenolate (CELLCEPT) 250 MG capsule Take 1 capsule (250 mg total) by mouth  2 (two) times daily. Take w/ 500 mg daily to equal 750 mg twice daily. 60 capsule 0  . mycophenolate (CELLCEPT) 500 MG tablet Take 1 tablet (500 mg total) by mouth 2 (two) times daily. W/ 250 mg to equal 750 mg twice daily. 60 tablet 1   No current facility-administered medications for this visit.    REVIEW OF SYSTEMS:   Constitutional:  Denies fevers, chills or abnormal night sweats Eyes: Denies blurriness of vision, double vision or watery eyes Cardiovascular: Denies palpitation, chest discomfort or lower extremity swelling Gastrointestinal:  Denies nausea, heartburn or change in bowel habits Skin: Denies abnormal skin rashes Lymphatics: Denies new lymphadenopathy or easy bruising Neurological:Denies numbness, tingling or new weaknesses Behavioral/Psych: Mood is stable, no new changes  All other systems were reviewed with the patient and are negative.  PHYSICAL EXAMINATION: ECOG PERFORMANCE STATUS: 1 - Symptomatic but completely ambulatory BP 137/66 T: 99 HR 94 RR 18 GENERAL:alert, no distress and comfortable SKIN: skin color, texture, turgor are normal, no rashes or significant lesions EYES: normal, conjunctiva are pink and non-injected, sclera clear OROPHARYNX:no exudate, no erythema and lips, buccal mucosa, and tongue normal  NECK: supple, thyroid normal size, non-tender, without nodularity LYMPH:  no palpable lymphadenopathy in the cervical, axillary or inguinal LUNGS: clear to auscultation and percussion with normal breathing effort HEART: regular rate & rhythm and no murmurs and no lower extremity edema ABDOMEN:abdomen soft, non-tender and normal bowel sounds Musculoskeletal:no cyanosis of digits and no clubbing  PSYCH: alert & oriented x 3 with fluent speech NEURO: no focal motor/sensory deficits  LABORATORY DATA:  I have reviewed the data as listed Lab Results  Component Value Date   WBC 8.5 01/17/2016   HGB 15.6 01/17/2016   HCT 46.3 01/17/2016   MCV 79.1* 01/17/2016   PLT 2* 01/17/2016    Recent Labs  04/27/15 0400 04/28/15 0845  11/26/15 1355 12/24/15 1326 12/26/15 1253 01/12/16 1404  NA 129* 137  < > 142 140 140 140  K 3.8 3.3*  < > 4.1 4.0 3.5 3.8  CL 100* 103  --   --   --  104  --   CO2 25 24  < > GLUCOSE 145* 175*  < > 81 100 98 86  BUN 17 16  < > 17.8 11.9 18 13.4   CREATININE 0.75 0.76  < > 1.0 0.9 0.78 1.0  CALCIUM 8.8* 8.9  < > 9.9 9.7 9.0 9.6  GFRNONAA >60 >60  --   --   --  >60  --   GFRAA >60 >60  --   --   --  >60  --   PROT  --   --   < > 8.0 7.9  --  8.6*  ALBUMIN  --   --   < > 4.6 4.7  --  4.4  AST  --   --   < > 28 23  --  38*  ALT  --   --   < > 43 30  --  85*  ALKPHOS  --   --   < > 61 63  --  67  BILITOT  --   --   < > 0.64 0.57  --  0.30  < > = values in this interval not displayed.  ASSESSMENT & PLAN:  Recurrent, severe ITP It is dangerous for the patient to leave now with severe ITP. I recommend direct admission to the hospital to  receive high-dose steroids, IVIG, and platelet transfusion. We discussed some of the risks, benefits, and alternatives of platelets transfusions. The patient is symptomatic from low platelet counts with at high risk of life-threatening bleeding and the platelet count is critically low.  Some of the side-effects to be expected including risks of transfusion reactions, chills, infection, syndrome of volume overload and risk of hospitalization from various reasons and the patient is willing to proceed I will give him pulse high-dose dexamethasone 40 mg daily I will give him IVIG 1 g/kg daily 2 We'll check his platelet daily   Discharge planning Hopefully he can be discharged home by Wednesday on 01/19/2016 if his platelet count improves greater than 50,000  All questions were answered. The patient knows to call the clinic with any problems, questions or concerns.   [facility], Abdiel Blackerby, MD 01/17/2016 9:49 AM

## 2016-01-17 NOTE — Progress Notes (Signed)
  He will be directly admitted to the hospital for management of recurrent, severe ITP. Please see dictation H&P

## 2016-01-18 ENCOUNTER — Encounter: Payer: Self-pay | Admitting: Hematology and Oncology

## 2016-01-18 DIAGNOSIS — L709 Acne, unspecified: Secondary | ICD-10-CM

## 2016-01-18 DIAGNOSIS — D693 Immune thrombocytopenic purpura: Principal | ICD-10-CM

## 2016-01-18 LAB — CBC
HCT: 42.7 % (ref 39.0–52.0)
HEMOGLOBIN: 13.8 g/dL (ref 13.0–17.0)
MCH: 26.3 pg (ref 26.0–34.0)
MCHC: 32.3 g/dL (ref 30.0–36.0)
MCV: 81.3 fL (ref 78.0–100.0)
PLATELETS: 25 10*3/uL — AB (ref 150–400)
RBC: 5.25 MIL/uL (ref 4.22–5.81)
RDW: 13.2 % (ref 11.5–15.5)
WBC: 10.2 10*3/uL (ref 4.0–10.5)

## 2016-01-18 LAB — PREPARE PLATELET PHERESIS: UNIT DIVISION: 0

## 2016-01-18 NOTE — Progress Notes (Signed)
I placed [employer] form for dr. Bertis Ruddy to sign.

## 2016-01-18 NOTE — Progress Notes (Signed)
John Brennan   DOB:1986-10-15   XL#:[id number]    Subjective: He feels well. Tolerated treatment well without any side effects. The patient denies any recent signs or symptoms of bleeding such as spontaneous epistaxis, hematuria or hematochezia.   Objective:  Filed Vitals:   01/18/16 1218 01/18/16 1352  BP: 127/72 128/69  Pulse: 66 81  Temp: 98 F (36.7 C)   Resp: 16     No intake or output data in the 24 hours ending 01/18/16 1401  GENERAL:alert, no distress and comfortable SKIN: skin color, texture, turgor are normal, no rashes or significant lesions EYES: normal, Conjunctiva are pink and non-injected, sclera clear OROPHARYNX:no exudate, no erythema and lips, buccal mucosa, and tongue normal  NECK: supple, thyroid normal size, non-tender, without nodularity LYMPH:  no palpable lymphadenopathy in the cervical, axillary or inguinal LUNGS: clear to auscultation and percussion with normal breathing effort HEART: regular rate & rhythm and no murmurs and no lower extremity edema ABDOMEN:abdomen soft, non-tender and normal bowel sounds Musculoskeletal:no cyanosis of digits and no clubbing  NEURO: alert & oriented x 3 with fluent speech, no focal motor/sensory deficits   Labs:  Lab Results  Component Value Date   WBC 10.2 01/18/2016   HGB 13.8 01/18/2016   HCT 42.7 01/18/2016   MCV 81.3 01/18/2016   PLT 25* 01/18/2016   NEUTROABS 6.4 01/17/2016    Lab Results  Component Value Date   NA 140 01/12/2016   K 3.8 01/12/2016   CL 104 12/26/2015   CO2 25 01/12/2016    Assessment & Plan:   ITP (idiopathic thrombocytopenic purpura) We'll continue high-dose dexamethasone and IVIG. Reassess tomorrow. resume rituximab next week. I would discontinue CellCept upon discharge  Acne  He has skin reaction likely due to excessive sun exposure while on Cellcept. He is on hydrocortisone cream and clindamycin. This is improving. I recommend we continue the same.  Discharge  planning Hopefully home tomorrow if platelet count continues to improve, preferably above 50,000 [facility], John Grandison, MD 01/18/2016  2:01 PM

## 2016-01-18 NOTE — Progress Notes (Signed)
CRITICAL VALUE ALERT  Critical value received:  Platelet count of 25 Date of notification:  01/18/16  Time of notification: 0517  Critical value read back:Yes  Nurse who received alert:  Kenton Kingfisher, RN  MD notified (1st page): N/A (lab value increased from previous value)  Time of first page: N/A  MD notified (2nd page): N/A  Time of second page: N/A  Responding MD: N/A  Time MD responded:  N/A

## 2016-01-19 ENCOUNTER — Telehealth: Payer: Self-pay | Admitting: Hematology and Oncology

## 2016-01-19 ENCOUNTER — Other Ambulatory Visit: Payer: Self-pay | Admitting: Hematology and Oncology

## 2016-01-19 DIAGNOSIS — D696 Thrombocytopenia, unspecified: Secondary | ICD-10-CM

## 2016-01-19 LAB — CBC WITH DIFFERENTIAL/PLATELET
BASOS PCT: 0 %
Basophils Absolute: 0 10*3/uL (ref 0.0–0.1)
EOS ABS: 0 10*3/uL (ref 0.0–0.7)
EOS PCT: 0 %
HCT: 40.6 % (ref 39.0–52.0)
HEMOGLOBIN: 12.8 g/dL — AB (ref 13.0–17.0)
LYMPHS ABS: 0.8 10*3/uL (ref 0.7–4.0)
Lymphocytes Relative: 5 %
MCH: 25.9 pg — AB (ref 26.0–34.0)
MCHC: 31.5 g/dL (ref 30.0–36.0)
MCV: 82 fL (ref 78.0–100.0)
MONOS PCT: 4 %
Monocytes Absolute: 0.7 10*3/uL (ref 0.1–1.0)
NEUTROS PCT: 91 %
Neutro Abs: 15.3 10*3/uL — ABNORMAL HIGH (ref 1.7–7.7)
PLATELETS: 105 10*3/uL — AB (ref 150–400)
RBC: 4.95 MIL/uL (ref 4.22–5.81)
RDW: 13.5 % (ref 11.5–15.5)
WBC: 16.8 10*3/uL — AB (ref 4.0–10.5)

## 2016-01-19 NOTE — Telephone Encounter (Signed)
Called language services and the patients voicemail is full,i had them call the cousins phone listed and he was able to leave a message there with 1/27 appt

## 2016-01-19 NOTE — Discharge Summary (Signed)
Physician Discharge Summary  Patient ID: John Brennan MRN: [id number] DOB/AGE: January 05, 1986 30 y.o.  Admit date: 01/17/2016 Discharge date: 01/19/2016  Primary Care Physician:  No PCP Per Patient   Discharge Diagnoses:    Present on Admission:  . ITP (idiopathic thrombocytopenic purpura) . Thrombocytopenia (HCC)  Discharge Medications:    Medication List    STOP taking these medications        mycophenolate 250 MG capsule  Commonly known as:  CELLCEPT     mycophenolate 500 MG tablet  Commonly known as:  CELLCEPT     OVER THE COUNTER MEDICATION      TAKE these medications        acetaminophen 325 MG tablet  Commonly known as:  TYLENOL  Take 2 tablets (650 mg total) by mouth every 6 (six) hours as needed for mild pain, fever or headache (or Fever >/= 101).     clindamycin 1 % gel  Commonly known as:  CLINDAGEL  Apply topically 2 (two) times daily.         Disposition and Follow-up: at [facility]  Significant Diagnostic Studies:  No results found.  Discharge Laboratory Values: Lab Results  Component Value Date   WBC 16.8* 01/19/2016   HGB 12.8* 01/19/2016   HCT 40.6 01/19/2016   MCV 82.0 01/19/2016   PLT 105* 01/19/2016   Lab Results  Component Value Date   NA 140 01/12/2016   K 3.8 01/12/2016   CL 104 12/26/2015   CO2 25 01/12/2016    Brief H and P: For complete details please refer to admission H and P, but in brief, was really admitted because of severe, recurrent ITP  Physical Exam at Discharge: BP 130/58 mmHg  Pulse 71  Temp(Src) 97.8 F (36.6 C) (Oral)  Resp 16  Ht  (1.6 m)  Wt 147 lb (66.679 kg)  BMI 26.05 kg/m2  SpO2 100% GENERAL:alert, no distress and comfortable SKIN: skin color, texture, turgor are normal, no rashes or significant lesions EYES: normal, Conjunctiva are pink and non-injected, sclera clear OROPHARYNX:no exudate, no erythema and lips, buccal mucosa, and tongue normal  NECK: supple, thyroid normal size,  non-tender, without nodularity LYMPH: no palpable lymphadenopathy in the cervical, axillary or inguinal LUNGS: clear to auscultation and percussion with normal breathing effort HEART: regular rate & rhythm and no murmurs and no lower extremity edema ABDOMEN:abdomen soft, non-tender and normal bowel sounds Musculoskeletal:no cyanosis of digits and no clubbing  NEURO: alert & oriented x 3 with fluent speech, no focal motor/sensory deficits Hospital Course: The patient received IVIG daily 2 and platelet transfusion. On the day of dismissal, his platelet count is greater than 100,000 and had no signs of bleeding Active Problems:   ITP (idiopathic thrombocytopenic purpura)   Thrombocytopenia (HCC)   Diet:  Regular  Activity:  As tolerated  Condition at Discharge:   Stable  Signed: Dr. Artis Delay [phone]  01/19/2016, 10:12 AM

## 2016-01-21 ENCOUNTER — Ambulatory Visit: Payer: Self-pay

## 2016-01-21 ENCOUNTER — Other Ambulatory Visit: Payer: Self-pay

## 2016-01-21 ENCOUNTER — Other Ambulatory Visit (HOSPITAL_BASED_OUTPATIENT_CLINIC_OR_DEPARTMENT_OTHER): Payer: Self-pay

## 2016-01-21 ENCOUNTER — Ambulatory Visit (HOSPITAL_BASED_OUTPATIENT_CLINIC_OR_DEPARTMENT_OTHER): Payer: Self-pay

## 2016-01-21 ENCOUNTER — Encounter: Payer: Self-pay | Admitting: Hematology and Oncology

## 2016-01-21 VITALS — BP 130/73 | HR 67 | Temp 98.0°F

## 2016-01-21 DIAGNOSIS — D693 Immune thrombocytopenic purpura: Secondary | ICD-10-CM

## 2016-01-21 DIAGNOSIS — D696 Thrombocytopenia, unspecified: Secondary | ICD-10-CM

## 2016-01-21 LAB — CBC WITH DIFFERENTIAL/PLATELET
BASO%: 0.4 % (ref 0.0–2.0)
Basophils Absolute: 0.1 10*3/uL (ref 0.0–0.1)
EOS%: 1.4 % (ref 0.0–7.0)
Eosinophils Absolute: 0.2 10*3/uL (ref 0.0–0.5)
HCT: 46.8 % (ref 38.4–49.9)
HGB: 15.2 g/dL (ref 13.0–17.1)
LYMPH%: 19.8 % (ref 14.0–49.0)
MCH: 25.7 pg — ABNORMAL LOW (ref 27.2–33.4)
MCHC: 32.4 g/dL (ref 32.0–36.0)
MCV: 79.2 fL — ABNORMAL LOW (ref 79.3–98.0)
MONO#: 1 10*3/uL — AB (ref 0.1–0.9)
MONO%: 7.7 % (ref 0.0–14.0)
NEUT#: 9.1 10*3/uL — ABNORMAL HIGH (ref 1.5–6.5)
NEUT%: 70.7 % (ref 39.0–75.0)
PLATELETS: 56 10*3/uL — AB (ref 140–400)
RBC: 5.92 10*6/uL — AB (ref 4.20–5.82)
RDW: 14.5 % (ref 11.0–14.6)
WBC: 13 10*3/uL — ABNORMAL HIGH (ref 4.0–10.3)
lymph#: 2.6 10*3/uL (ref 0.9–3.3)

## 2016-01-21 MED ORDER — ROMIPLOSTIM INJECTION 500 MCG
530.0000 ug | SUBCUTANEOUS | Status: DC
  Administered 2016-01-21: 530 ug via SUBCUTANEOUS
  Filled 2016-01-21: qty 1

## 2016-01-21 NOTE — Progress Notes (Signed)
Per Dr Bertis Ruddy, John Brennan will continue to receive N-Plate weekly, until Rituxin starts to keep PLTC up.

## 2016-01-21 NOTE — Progress Notes (Signed)
I spoke with patient and julia(intrprt) for rituxan app. He doesn't work and Amil Amen is going to get him to get letter of support from brother.

## 2016-01-24 ENCOUNTER — Ambulatory Visit (HOSPITAL_COMMUNITY)
Admission: RE | Admit: 2016-01-24 | Discharge: 2016-01-24 | Disposition: A | Discharge status: Home / Self Care | Payer: Medicaid Other | Source: Ambulatory Visit | Attending: Hematology | Admitting: Hematology

## 2016-01-24 ENCOUNTER — Ambulatory Visit (HOSPITAL_BASED_OUTPATIENT_CLINIC_OR_DEPARTMENT_OTHER): Payer: Self-pay

## 2016-01-24 ENCOUNTER — Other Ambulatory Visit (HOSPITAL_BASED_OUTPATIENT_CLINIC_OR_DEPARTMENT_OTHER): Payer: Self-pay

## 2016-01-24 ENCOUNTER — Other Ambulatory Visit: Payer: Self-pay | Admitting: Hematology and Oncology

## 2016-01-24 ENCOUNTER — Encounter (HOSPITAL_COMMUNITY): Payer: Self-pay

## 2016-01-24 ENCOUNTER — Inpatient Hospital Stay (HOSPITAL_COMMUNITY)
Admission: AD | Admit: 2016-01-24 | Discharge: 2016-01-26 | DRG: 813 | Disposition: A | Discharge status: Home / Self Care | Payer: Medicaid Other | Source: Ambulatory Visit | Attending: Hematology and Oncology | Admitting: Hematology and Oncology

## 2016-01-24 VITALS — BP 129/78 | HR 70 | Temp 97.6°F | Resp 17

## 2016-01-24 DIAGNOSIS — D696 Thrombocytopenia, unspecified: Secondary | ICD-10-CM

## 2016-01-24 DIAGNOSIS — R040 Epistaxis: Secondary | ICD-10-CM | POA: Diagnosis present

## 2016-01-24 DIAGNOSIS — D693 Immune thrombocytopenic purpura: Principal | ICD-10-CM | POA: Diagnosis present

## 2016-01-24 LAB — CBC WITH DIFFERENTIAL/PLATELET
BASO%: 0.3 % (ref 0.0–2.0)
BASOS ABS: 0 10*3/uL (ref 0.0–0.1)
EOS%: 3.5 % (ref 0.0–7.0)
Eosinophils Absolute: 0.3 10*3/uL (ref 0.0–0.5)
HCT: 49.1 % (ref 38.4–49.9)
HEMOGLOBIN: 16.6 g/dL (ref 13.0–17.1)
LYMPH#: 1.5 10*3/uL (ref 0.9–3.3)
LYMPH%: 17.1 % (ref 14.0–49.0)
MCH: 26.6 pg — AB (ref 27.2–33.4)
MCHC: 33.8 g/dL (ref 32.0–36.0)
MCV: 78.8 fL — AB (ref 79.3–98.0)
MONO#: 0.5 10*3/uL (ref 0.1–0.9)
MONO%: 5.7 % (ref 0.0–14.0)
NEUT#: 6.6 10*3/uL — ABNORMAL HIGH (ref 1.5–6.5)
NEUT%: 73.4 % (ref 39.0–75.0)
PLATELETS: 3 10*3/uL — AB (ref 140–400)
RBC: 6.23 10*6/uL — ABNORMAL HIGH (ref 4.20–5.82)
RDW: 13.8 % (ref 11.0–14.6)
WBC: 9 10*3/uL (ref 4.0–10.3)
nRBC: 0 % (ref 0–0)

## 2016-01-24 MED ORDER — ONDANSETRON HCL 4 MG/2ML IJ SOLN: 4.0000 mg | Freq: Three times a day (TID) | INTRAMUSCULAR | Status: DC | PRN

## 2016-01-24 MED ORDER — SODIUM CHLORIDE 0.9 % IV SOLN
8.0000 mg | Freq: Three times a day (TID) | INTRAVENOUS | Status: DC | PRN
  Filled 2016-01-24: qty 4

## 2016-01-24 MED ORDER — ACETAMINOPHEN 325 MG PO TABS
650.0000 mg | ORAL_TABLET | Freq: Once | ORAL | Status: AC
  Administered 2016-01-24: 650 mg via ORAL

## 2016-01-24 MED ORDER — IMMUNE GLOBULIN (HUMAN) 10 GM/100ML IV SOLN
1.0000 g/kg | INTRAVENOUS | Status: AC
Start: 2016-01-24 — End: 2016-01-25
  Administered 2016-01-24 – 2016-01-25 (×2): 65 g via INTRAVENOUS
  Filled 2016-01-24 (×2): qty 600

## 2016-01-24 MED ORDER — DIPHENHYDRAMINE HCL 25 MG PO CAPS
25.0000 mg | ORAL_CAPSULE | Freq: Once | ORAL | Status: AC
  Administered 2016-01-24: 25 mg via ORAL

## 2016-01-24 MED ORDER — ONDANSETRON 4 MG PO TBDP: 4.0000 mg | ORAL_TABLET | Freq: Three times a day (TID) | ORAL | Status: DC | PRN

## 2016-01-24 MED ORDER — ACETAMINOPHEN 325 MG PO TABS: 650.0000 mg | ORAL_TABLET | ORAL | Status: DC | PRN

## 2016-01-24 MED ORDER — DIPHENHYDRAMINE HCL 25 MG PO CAPS
ORAL_CAPSULE | ORAL | Status: AC
  Filled 2016-01-24: qty 1

## 2016-01-24 MED ORDER — SODIUM CHLORIDE 0.9 % IV SOLN
20.0000 mg | Freq: Once | INTRAVENOUS | Status: AC
  Administered 2016-01-24: 20 mg via INTRAVENOUS
  Filled 2016-01-24: qty 2

## 2016-01-24 MED ORDER — SENNOSIDES-DOCUSATE SODIUM 8.6-50 MG PO TABS: 1.0000 | ORAL_TABLET | Freq: Every evening | ORAL | Status: DC | PRN

## 2016-01-24 MED ORDER — GUAIFENESIN-DM 100-10 MG/5ML PO SYRP: 10.0000 mL | ORAL_SOLUTION | ORAL | Status: DC | PRN

## 2016-01-24 MED ORDER — ACETAMINOPHEN 325 MG PO TABS
ORAL_TABLET | ORAL | Status: AC
  Filled 2016-01-24: qty 2

## 2016-01-24 MED ORDER — ONDANSETRON HCL 4 MG PO TABS: 4.0000 mg | ORAL_TABLET | Freq: Three times a day (TID) | ORAL | Status: DC | PRN

## 2016-01-24 MED ORDER — DEXAMETHASONE 4 MG PO TABS
40.0000 mg | ORAL_TABLET | Freq: Every day | ORAL | Status: DC
  Administered 2016-01-25 – 2016-01-26 (×2): 40 mg via ORAL
  Filled 2016-01-24 (×2): qty 10

## 2016-01-24 MED ORDER — ALUM & MAG HYDROXIDE-SIMETH 200-200-20 MG/5ML PO SUSP: 60.0000 mL | ORAL | Status: DC | PRN

## 2016-01-24 NOTE — H&P (Signed)
[facility] ADMISSION NOTE  Patient Care Team: No Pcp Per Patient as PCP - General (General Practice)  CHIEF COMPLAINTS/PURPOSE OF ADMISSION:  Recurrent severe ITP  HISTORY OF PRESENTING ILLNESS:  John Brennan 30 y.o. male is readmitted to the hospital again for refractory, recurrent ITP. The patient was just admitted and discharged last Wednesday on 01/19/2016 for similar issue. With his prior admission, he received high-dose dexamethasone 40 mg daily 3 days along with 2 doses of IVIG at 1 g/kg 2 days. He also received platelet transfusion on 01/17/2016. CellCept was discontinued as hit was deemed ineffective due to recurrent thrombocytopenia. He received 1 dose of Nplate on 01/21/2016 at [facility] The patient show up at the cancer center today to receive his second dose of rituximab but was found to have severe thrombocytopenia again. History was obtained via an interpreter. According to the patient, on 01/20/2016, he started noted gum bleeding. He also have epistaxis yesterday on 01/23/2016 but did not seek any help because he is coming in today for treatment. He denies hematuria or hematochezia. He denies fever, chills, lymphadenopathy, or sick contact. His appetite is stable. Denies recent weight change.  Summary of hematologic history He was recently hospitalized for similar reason of in April 2016, twice, and had received IVIG and corticosteroid therapy. Please see my initial consult note dated 03/26/2015 for further details. The patient was started on IVIG, dexamethasone, Solu-Medrol and prednisone. He is consider steroid refractory. On 04/23/2015, he is started on CellCept 250 mg twice a day From 04/30/2015 to 04/28/2015, he was readmitted to the hospital due to bleeding. Bone marrow aspirate and biopsy performed on 04/27/2015 confirmed diagnosis of ITP. He was started also in addition to all the above, Nplate injection weekly On 05/04/2015, he received his last  platelet transfusion. Starting 05/07/2015, prednisone taper was initiated. On 05/31/2015, prednisone was discontinued and the patient was placed on CellCept along with Nplate on 07/14/2015, CellCept was reduced to 500 mg twice a day along with weekly Nplate injection On 09/10/2015, he suffered from relapse due to recent infection. 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 He received 1 unit of platelet transfusion The patient was readmitted to the hospital from 12/26/2015 to 12/27/2015 with relapsed ITP. He responded to high-dose IVIG and steroids On 01/10/2016, he received first dose rituximab On 01/17/16: he had recurrent ITP and was hospitalized until January 20 50,017. He received 1 unit of platelets on 01/17/2016 along worth 2 days of high-dose IVIG. CellCept was discontinued  MEDICAL HISTORY:  Past Medical History  Diagnosis Date  . Thrombocytopenia (HCC)   . Drug-induced skin rash 07/14/2015  . Fatty liver   . Acne 12/31/2015    SURGICAL HISTORY: History reviewed. No pertinent past surgical history.  SOCIAL HISTORY: Social History   Social History  . Marital Status: Single    Spouse Name: N/A  . Number of Children: N/A  . Years of Education: N/A   Occupational History  . Construction work    Social History Main Topics  . Smoking status: Never Smoker   . Smokeless tobacco: Never Used  . Alcohol Use: 1.8 oz/week    3 Cans of beer per week     Comment: occassional beer  . Drug Use: No  . Sexual Activity: Yes   Other Topics  Concern  . Not on file   Social History Narrative    FAMILY HISTORY: Family History  Problem Relation Age of Onset  . Diabetes Father     ALLERGIES:  has No Known Allergies.  MEDICATIONS:  Current  Facility-Administered Medications  Medication Dose Route Frequency Provider Last Rate Last Dose  . acetaminophen (TYLENOL) tablet 650 mg  650 mg Oral Q4H PRN Artis Delay, MD      . alum & mag hydroxide-simeth (MAALOX/MYLANTA) 200-200-20 MG/5ML suspension 60 mL  60 mL Oral Q4H PRN Artis Delay, MD      . Melene Muller ON 01/25/2016] dexamethasone (DECADRON) tablet 40 mg  40 mg Oral Daily Marua Qin, MD      . guaiFENesin-dextromethorphan (ROBITUSSIN DM) 100-10 MG/5ML syrup 10 mL  10 mL Oral Q4H PRN Artis Delay, MD      . Immune Globulin 10% (OCTAGAM) IV infusion 65 g  1 g/kg Intravenous Q24H Paxtyn Boyar, MD      . ondansetron (ZOFRAN) tablet 4-8 mg  4-8 mg Oral Q8H PRN Artis Delay, MD       Or  . ondansetron (ZOFRAN-ODT) disintegrating tablet 4-8 mg  4-8 mg Oral Q8H PRN Artis Delay, MD       Or  . ondansetron (ZOFRAN) injection 4 mg  4 mg Intravenous Q8H PRN Artis Delay, MD       Or  . ondansetron (ZOFRAN) 8 mg in sodium chloride 0.9 % 50 mL IVPB  8 mg Intravenous Q8H PRN Tunisia Landgrebe, MD      . senna-docusate (Senokot-S) tablet 1 tablet  1 tablet Oral QHS PRN Artis Delay, MD        REVIEW OF SYSTEMS:   Constitutional: Denies fevers, chills or abnormal night sweats Eyes: Denies blurriness of vision, double vision or watery eyes Ears, nose, mouth, throat, and face: Denies mucositis or sore throat Respiratory: Denies cough, dyspnea or wheezes Cardiovascular: Denies palpitation, chest discomfort or lower extremity swelling Gastrointestinal:  Denies nausea, heartburn or change in bowel habits Skin: Denies abnormal skin rashes Lymphatics: Denies new lymphadenopathy Neurological:Denies numbness, tingling or new weaknesses Behavioral/Psych: Mood is stable, no new changes  All other systems were reviewed with the patient and are negative.  PHYSICAL EXAMINATION: ECOG PERFORMANCE STATUS: 0 - Asymptomatic  Filed Vitals:   01/24/16 1253  BP: 130/70  Pulse: 68  Temp: 98.5 F (36.9 C)  Resp: 16   Filed  Weights   01/24/16 1253  Weight: 149 lb 3.2 oz (67.677 kg)    GENERAL:alert, no distress and comfortable SKIN: skin color, texture, turgor are normal, no rashes or significant lesions EYES: normal, conjunctiva are pink and non-injected, sclera clear OROPHARYNX:noted petechiae hemorrhages in his hard palate, tongue and gums NECK: supple, thyroid normal size, non-tender, without nodularity LYMPH:  no palpable lymphadenopathy in the cervical, axillary or inguinal LUNGS: clear to auscultation and percussion with normal breathing effort HEART: regular rate & rhythm and no murmurs and no lower extremity edema ABDOMEN:abdomen soft, non-tender and normal bowel sounds Musculoskeletal:no cyanosis of digits and no clubbing  PSYCH: alert & oriented x 3 with fluent speech NEURO: no focal motor/sensory deficits  LABORATORY DATA:  I have reviewed the data as listed Lab Results  Component Value Date   WBC 9.0 01/24/2016   HGB 16.6 01/24/2016   HCT 49.1 01/24/2016   MCV 78.8* 01/24/2016   PLT 3* 01/24/2016    Recent Labs  04/27/15 0400 04/28/15 0845  11/26/15 1355 12/24/15 1326 12/26/15 1253 01/12/16 1404  NA 129* 137  < > 142 140 140 140  K 3.8 3.3*  < > 4.1 4.0 3.5 3.8  CL 100* 103  --   --   --  104  --   CO2 25 24  < > GLUCOSE 145* 175*  < > 81 100 98 86  BUN 17 16  < > 17.8 11.9 18 13.4  CREATININE 0.75 0.76  < > 1.0 0.9 0.78 1.0  CALCIUM 8.8* 8.9  < > 9.9 9.7 9.0 9.6  GFRNONAA >60 >60  --   --   --  >60  --   GFRAA >60 >60  --   --   --  >60  --   PROT  --   --   < > 8.0 7.9  --  8.6*  ALBUMIN  --   --   < > 4.6 4.7  --  4.4  AST  --   --   < > 28 23  --  38*  ALT  --   --   < > 43 30  --  85*  ALKPHOS  --   --   < > 61 63  --  67  BILITOT  --   --   < > 0.64 0.57  --  0.30  < > = values in this interval not displayed.  ASSESSMENT & PLAN:   Recurrent, severe ITP It is dangerous for the patient to leave now with severe ITP. I recommend direct admission to  the hospital to receive high-dose steroids, IVIG, and platelet transfusion as needed I have arrange for platelet transfusion at [facility] today prior to admission. He had received 1 dose of IV dexamethasone at 20 mg I will give him pulse high-dose dexamethasone 40 mg daily starting tomorrow I will give him IVIG 1 g/kg daily 2 starting today We'll check his platelet daily  The patient is deemed refractory to steroids, CellCept, Nplate, IVIG and he is not able to receive rituximab due to persistent recurrent ITP I have a long discussion again with the patient in the presence of the interpreter Again, I will recommend consideration for splenectomy Other potential treatment options included azathioprine or cyclosporine but they would be at risk of causing major immunosuppression. Cyclosporine would have additional risks of causing kidney injury With the lack of insurance, I am not sure if I can refer him to tertiary center such as [facility] [place] or [facility] for second opinion Also, with the lack of insurance, the cost of Promacta would be prohibitive The patient will think about options as outlined above  CODE STATUS Full Code  DVT prophylaxis Lovenox is contraindicated with low platelet count I encourage increased ambulation  Discharge planning Hopefully he can be discharged home by Wednesday if his platelet count improves greater than 50,000  All questions were answered.     [facility], Adreonna Yontz, MD 01/24/2016 3:09 PM

## 2016-01-24 NOTE — Progress Notes (Signed)
Patient completed unit of PRBC's in infusion room and tolerated well.  He and Spanish interpreter were taken to admitting per Cameo RN's direction in wheelchair by tech; pt to be admitted to floor from there.

## 2016-01-24 NOTE — Progress Notes (Signed)
For interpreter call Delorise Royals [phone]

## 2016-01-25 LAB — CBC WITH DIFFERENTIAL/PLATELET
BASOS ABS: 0 10*3/uL (ref 0.0–0.1)
Basophils Relative: 0 %
EOS PCT: 0 %
Eosinophils Absolute: 0 10*3/uL (ref 0.0–0.7)
HEMATOCRIT: 42 % (ref 39.0–52.0)
Hemoglobin: 14.1 g/dL (ref 13.0–17.0)
LYMPHS ABS: 0.8 10*3/uL (ref 0.7–4.0)
LYMPHS PCT: 5 %
MCH: 26.9 pg (ref 26.0–34.0)
MCHC: 33.6 g/dL (ref 30.0–36.0)
MCV: 80.2 fL (ref 78.0–100.0)
MONO ABS: 0.7 10*3/uL (ref 0.1–1.0)
Monocytes Relative: 4 %
NEUTROS ABS: 16.3 10*3/uL — AB (ref 1.7–7.7)
Neutrophils Relative %: 91 %
RBC: 5.24 MIL/uL (ref 4.22–5.81)
RDW: 13.4 % (ref 11.5–15.5)
WBC: 17.8 10*3/uL — ABNORMAL HIGH (ref 4.0–10.5)

## 2016-01-25 LAB — PREPARE PLATELET PHERESIS: UNIT DIVISION: 0

## 2016-01-25 MED ORDER — SODIUM CHLORIDE 0.9 % IV SOLN
Freq: Once | INTRAVENOUS | Status: AC
  Administered 2016-01-25: 12:00:00 via INTRAVENOUS

## 2016-01-25 NOTE — Progress Notes (Signed)
John Brennan   DOB:Feb 20, 1986   UJ#:[id number]   History is via an interpreter Subjective:  He feels well. Denies active bleeding. He tolerated treatment  Well without any side effects.  Objective:  Filed Vitals:   01/25/16 1218 01/25/16 1255  BP: 140/66 139/76  Pulse: 72 87  Temp: 98 F (36.7 C) 98.3 F (36.8 C)  Resp: 16 16     Intake/Output Summary (Last 24 hours) at 01/25/16 1346 Last data filed at 01/25/16 1255  Gross per 24 hour  Intake 578.33 ml  Output      0 ml  Net 578.33 ml    GENERAL:alert, no distress and comfortable SKIN: skin color, texture, turgor are normal, no rashes or significant lesions. Noted skin bruising EYES: normal, Conjunctiva are pink and non-injected, sclera clear OROPHARYNX:no exudate, no erythema and lips, buccal mucosa, and tongue normal . Some of the submucosa hemorrhage had resolved NECK: supple, thyroid normal size, non-tender, without nodularity LYMPH:  no palpable lymphadenopathy in the cervical, axillary or inguinal LUNGS: clear to auscultation and percussion with normal breathing effort HEART: regular rate & rhythm and no murmurs and no lower extremity edema ABDOMEN:abdomen soft, non-tender and normal bowel sounds Musculoskeletal:no cyanosis of digits and no clubbing  NEURO: alert & oriented x 3 with fluent speech, no focal motor/sensory deficits   Labs:  Lab Results  Component Value Date   WBC 17.8* 01/25/2016   HGB 14.1 01/25/2016   HCT 42.0 01/25/2016   MCV 80.2 01/25/2016   PLT <5* 01/25/2016   NEUTROABS 16.3* 01/25/2016    Lab Results  Component Value Date   NA 140 01/12/2016   K 3.8 01/12/2016   CL 104 12/26/2015   CO2 25 01/12/2016   Assessment & Plan:   Recurrent, severe ITP The patient is hospitalized because of recurrent, severe ITP. Unfortunately, his platelet count remained under 10,000 I will give him another unit of platelet today along with second dose of high-dose dexamethasone and IVIG We'll check his  platelet daily  The patient is deemed refractory to steroids, CellCept, Nplate, IVIG and he is not able to receive rituximab due to persistent recurrent ITP I have a long discussion again with the patient in the presence of the interpreter With current low platelet count, he cannot undergo splenectomy Other potential treatment options included azathioprine or cyclosporine but they would be at risk of causing major immunosuppression. Cyclosporine would have additional risks of causing kidney injury With the lack of insurance, I am not sure if I can refer him to tertiary center such as [facility], [facility] or [facility] for second opinion Also, with the lack of insurance, the cost of Promacta would be prohibitive The patient will think about options as outlined above If platelet count remained low tomorrow, I will give him WinRho  CODE STATUS Full Code  DVT prophylaxis Lovenox is contraindicated with low platelet count I encourage increased ambulation  Discharge planning Hopefully he can be discharged home by Wednesday if his platelet count improves greater than 50,000  Romualdo Prosise, MD 01/25/2016  1:46 PM

## 2016-01-25 NOTE — Progress Notes (Signed)
Pt has no platelets this morning per lab. Md oncall notified.

## 2016-01-26 ENCOUNTER — Other Ambulatory Visit: Payer: Self-pay | Admitting: Hematology and Oncology

## 2016-01-26 ENCOUNTER — Telehealth: Payer: Self-pay | Admitting: Hematology and Oncology

## 2016-01-26 LAB — CBC WITH DIFFERENTIAL/PLATELET
Basophils Absolute: 0 10*3/uL (ref 0.0–0.1)
Basophils Relative: 0 %
EOS ABS: 0 10*3/uL (ref 0.0–0.7)
Eosinophils Relative: 0 %
HEMATOCRIT: 40.3 % (ref 39.0–52.0)
HEMOGLOBIN: 13.4 g/dL (ref 13.0–17.0)
LYMPHS ABS: 1.1 10*3/uL (ref 0.7–4.0)
LYMPHS PCT: 5 %
MCH: 26.9 pg (ref 26.0–34.0)
MCHC: 33.3 g/dL (ref 30.0–36.0)
MCV: 80.8 fL (ref 78.0–100.0)
MONOS PCT: 4 %
Monocytes Absolute: 0.8 10*3/uL (ref 0.1–1.0)
NEUTROS ABS: 19.2 10*3/uL — AB (ref 1.7–7.7)
NEUTROS PCT: 91 %
Platelets: 61 10*3/uL — ABNORMAL LOW (ref 150–400)
RBC: 4.99 MIL/uL (ref 4.22–5.81)
RDW: 13.7 % (ref 11.5–15.5)
WBC: 21 10*3/uL — AB (ref 4.0–10.5)

## 2016-01-26 LAB — PREPARE PLATELET PHERESIS: UNIT DIVISION: 0

## 2016-01-26 NOTE — Discharge Summary (Signed)
Physician Discharge Summary  Patient ID: John Brennan MRN: [id number] DOB/AGE: 08/20/1986 30 y.o.  Admit date: 01/24/2016 Discharge date: 01/26/2016  Primary Care Physician:  No PCP Per Patient   Discharge Diagnoses:    Present on Admission:  . ITP (idiopathic thrombocytopenic purpura)  Discharge Medications:    Medication List    TAKE these medications        acetaminophen 325 MG tablet  Commonly known as:  TYLENOL  Take 2 tablets (650 mg total) by mouth every 6 (six) hours as needed for mild pain, fever or headache (or Fever >/= 101).     diphenhydrAMINE 25 MG tablet  Commonly known as:  SOMINEX  Take 25 mg by mouth every 6 (six) hours as needed for itching, allergies or sleep.         Disposition and Follow-up:   Significant Diagnostic Studies:  No results found.  Discharge Laboratory Values: Lab Results  Component Value Date   WBC 21.0* 01/26/2016   HGB 13.4 01/26/2016   HCT 40.3 01/26/2016   MCV 80.8 01/26/2016   PLT 61* 01/26/2016   Lab Results  Component Value Date   NA 140 01/12/2016   K 3.8 01/12/2016   CL 104 12/26/2015   CO2 25 01/12/2016    Brief H and P: For complete details please refer to admission H and P, but in brief, the patient was admitted to the hospital due to severe, recurrent ITP. Physical Exam at Discharge: BP 120/82 mmHg  Pulse 59  Temp(Src) 97 F (36.1 C) (Oral)  Resp 20  Ht 5' 5.5" (1.664 m)  Wt 149 lb 3.2 oz (67.677 kg)  BMI 24.44 kg/m2  SpO2 100%  GENERAL:alert, no distress and comfortable SKIN: skin color, texture, turgor are normal, no rashes or significant lesions EYES: normal, conjunctiva are pink and non-injected, sclera clear OROPHARYNX:noted resolution of petechiae hemorrhages in his hard palate, tongue and gums NECK: supple, thyroid normal size, non-tender, without nodularity LYMPH: no palpable lymphadenopathy in the cervical, axillary or inguinal LUNGS: clear to auscultation and percussion  with normal breathing effort HEART: regular rate & rhythm and no murmurs and no lower extremity edema ABDOMEN:abdomen soft, non-tender and normal bowel sounds Musculoskeletal:no cyanosis of digits and no clubbing  PSYCH: alert & oriented x 3 with fluent speech NEURO: no focal motor/sensory deficits  Hospital Course:  Active Problems:   ITP (idiopathic thrombocytopenic purpura)  Recurrent, severe ITP It is dangerous for the patient to leave now with severe ITP. I recommend direct admission to the hospital to receive high-dose steroids, IVIG, and platelet transfusion as needed  He received 1 unit of platelets on 01/24/2016 and 01/25/2016. He received 1 g/kg IVIG daily 2 starting 01/24/2016. At the time of hospital discharge, his platelet count has improved to 60,000.  CODE STATUS Full Code Diet:  regular  Activity:  As tolerated  Condition at Discharge:   stable  Signed: Dr. Artis Delay [phone]  01/26/2016, 8:53 AM

## 2016-01-26 NOTE — Progress Notes (Signed)
Nursing Discharge Summary  Patient ID: John Brennan MRN: [id number] DOB/AGE: [birth year] 30 y.o.  Admit date: 01/24/2016 Discharge date: 01/26/2016  Discharged Condition: good  Disposition: 01-Home or Self Care    Prescriptions Given: No prescriptions given.  Patient given discharge instructions in Spanish with [facility] as interpreter.  Patient verbalized understanding without further questions.   Means of Discharge: Patient to ambulate downstairs to be discharged home to drive home via private vehicle.    Signed: Gloriajean Dell 01/26/2016, 12:39 PM

## 2016-01-26 NOTE — Telephone Encounter (Signed)
Spoke to patients interpreter to inform patient of appointment changes and new scheduled times for 2/3 per 2/1 pof.

## 2016-01-28 ENCOUNTER — Encounter: Payer: Self-pay | Admitting: Hematology and Oncology

## 2016-01-28 ENCOUNTER — Telehealth: Payer: Self-pay | Admitting: *Deleted

## 2016-01-28 ENCOUNTER — Telehealth: Payer: Self-pay | Admitting: Hematology and Oncology

## 2016-01-28 ENCOUNTER — Ambulatory Visit (HOSPITAL_BASED_OUTPATIENT_CLINIC_OR_DEPARTMENT_OTHER): Payer: Self-pay

## 2016-01-28 ENCOUNTER — Other Ambulatory Visit (HOSPITAL_BASED_OUTPATIENT_CLINIC_OR_DEPARTMENT_OTHER): Payer: Self-pay

## 2016-01-28 ENCOUNTER — Ambulatory Visit (HOSPITAL_BASED_OUTPATIENT_CLINIC_OR_DEPARTMENT_OTHER): Payer: Self-pay | Admitting: Hematology and Oncology

## 2016-01-28 ENCOUNTER — Ambulatory Visit: Payer: Self-pay

## 2016-01-28 VITALS — BP 116/68 | HR 67 | Temp 98.9°F | Resp 20

## 2016-01-28 VITALS — BP 120/60 | HR 85 | Temp 97.8°F | Resp 18 | Wt 151.7 lb

## 2016-01-28 DIAGNOSIS — D693 Immune thrombocytopenic purpura: Secondary | ICD-10-CM

## 2016-01-28 DIAGNOSIS — Z5112 Encounter for antineoplastic immunotherapy: Secondary | ICD-10-CM

## 2016-01-28 DIAGNOSIS — D696 Thrombocytopenia, unspecified: Secondary | ICD-10-CM

## 2016-01-28 LAB — CBC WITH DIFFERENTIAL/PLATELET
BASO%: 0.2 % (ref 0.0–2.0)
BASOS ABS: 0 10*3/uL (ref 0.0–0.1)
EOS ABS: 0.1 10*3/uL (ref 0.0–0.5)
EOS%: 0.8 % (ref 0.0–7.0)
HEMATOCRIT: 47.4 % (ref 38.4–49.9)
HGB: 15.3 g/dL (ref 13.0–17.1)
LYMPH%: 22.7 % (ref 14.0–49.0)
MCH: 26.5 pg — AB (ref 27.2–33.4)
MCHC: 32.3 g/dL (ref 32.0–36.0)
MCV: 82 fL (ref 79.3–98.0)
MONO#: 0.7 10*3/uL (ref 0.1–0.9)
MONO%: 5.4 % (ref 0.0–14.0)
NEUT#: 9.4 10*3/uL — ABNORMAL HIGH (ref 1.5–6.5)
NEUT%: 70.9 % (ref 39.0–75.0)
PLATELETS: 398 10*3/uL (ref 140–400)
RBC: 5.78 10*6/uL (ref 4.20–5.82)
RDW: 14.3 % (ref 11.0–14.6)
WBC: 13.3 10*3/uL — ABNORMAL HIGH (ref 4.0–10.3)
lymph#: 3 10*3/uL (ref 0.9–3.3)
nRBC: 0 % (ref 0–0)

## 2016-01-28 MED ORDER — ACETAMINOPHEN 325 MG PO TABS
650.0000 mg | ORAL_TABLET | Freq: Once | ORAL | Status: AC
  Administered 2016-01-28: 650 mg via ORAL

## 2016-01-28 MED ORDER — DIPHENHYDRAMINE HCL 25 MG PO CAPS
ORAL_CAPSULE | ORAL | Status: AC
  Filled 2016-01-28: qty 2

## 2016-01-28 MED ORDER — ROMIPLOSTIM INJECTION 500 MCG
530.0000 ug | SUBCUTANEOUS | Status: DC
  Administered 2016-01-28: 530 ug via SUBCUTANEOUS
  Filled 2016-01-28: qty 1.06

## 2016-01-28 MED ORDER — SODIUM CHLORIDE 0.9 % IV SOLN
375.0000 mg/m2 | Freq: Once | INTRAVENOUS | Status: AC
  Administered 2016-01-28: 700 mg via INTRAVENOUS
  Filled 2016-01-28: qty 70

## 2016-01-28 MED ORDER — SODIUM CHLORIDE 0.9 % IV SOLN
Freq: Once | INTRAVENOUS | Status: AC
  Administered 2016-01-28: 09:00:00 via INTRAVENOUS

## 2016-01-28 MED ORDER — DIPHENHYDRAMINE HCL 25 MG PO CAPS
50.0000 mg | ORAL_CAPSULE | Freq: Once | ORAL | Status: AC
  Administered 2016-01-28: 50 mg via ORAL

## 2016-01-28 MED ORDER — DEXAMETHASONE 4 MG PO TABS
ORAL_TABLET | ORAL | Status: AC
  Filled 2016-01-28: qty 10

## 2016-01-28 MED ORDER — ACETAMINOPHEN 325 MG PO TABS
ORAL_TABLET | ORAL | Status: AC
  Filled 2016-01-28: qty 2

## 2016-01-28 MED ORDER — ROMIPLOSTIM INJECTION 500 MCG: 530.0000 ug | SUBCUTANEOUS | Status: DC

## 2016-01-28 MED ORDER — DEXAMETHASONE 4 MG PO TABS
40.0000 mg | ORAL_TABLET | Freq: Every day | ORAL | Status: DC
  Administered 2016-01-28: 40 mg via ORAL

## 2016-01-28 NOTE — Telephone Encounter (Signed)
per pof to sch pt appt-gave pr copy of avs-MW sch trmt

## 2016-01-28 NOTE — Progress Notes (Signed)
N-plate injection given by infusion nurse

## 2016-01-28 NOTE — Telephone Encounter (Signed)
Per staff message and POF I have scheduled appts. Advised scheduler of appts. JMW  

## 2016-01-28 NOTE — Assessment & Plan Note (Signed)
Via an interpreter, I discussed with the patient treatment options. Despite being on Nplate and CellCept, he had multiple recurrence/relapses of ITP. We discussed potential splenectomy or trial of rituximab. We also discussed monthly IVIG, cyclosporine or azathioprine I recommend we continue Nplate until he had received at least 4 doses of rituximab I have rescheduled rituximab to be given weekly starting today for the remaining of his 3 doses I will see him next week but he will come to twice a week to get his blood work monitored

## 2016-01-28 NOTE — Patient Instructions (Addendum)
[facility] Discharge Instructions for Patients Receiving Chemotherapy  Today you received the following chemotherapy agents Rituxan.  To help prevent nausea and vomiting after your treatment, we encourage you to take your nausea medication as prescribed.   If you develop nausea and vomiting that is not controlled by your nausea medication, call the clinic.   BELOW ARE SYMPTOMS THAT SHOULD BE REPORTED IMMEDIATELY:  *FEVER GREATER THAN 100.5 F  *CHILLS WITH OR WITHOUT FEVER  NAUSEA AND VOMITING THAT IS NOT CONTROLLED WITH YOUR NAUSEA MEDICATION  *UNUSUAL SHORTNESS OF BREATH  *UNUSUAL BRUISING OR BLEEDING  TENDERNESS IN MOUTH AND THROAT WITH OR WITHOUT PRESENCE OF ULCERS  *URINARY PROBLEMS  *BOWEL PROBLEMS  UNUSUAL RASH Items with * indicate a potential emergency and should be followed up as soon as possible.  Feel free to call the clinic you have any questions or concerns. The clinic phone number is [phone].  Please show the CHEMO ALERT CARD at check-in to the Emergency Department and triage nurse.  Rituximab injection Qu es este medicamento? El RITUXIMAB es un anticuerpo monoclonal. Este medicamento cambia la forma en que funciona el sistema inmunolgico. Se utiliza comnmente para el tratamiento del linfoma no Hodgkin y Lexicographer. En las clulas cancerosas, este medicamento acta sobre una protena especfica que se encuentra en las clulas cancerosas y detiene su crecimiento. Se utiliza tambin para el tratamiento de artritis reumatoide (AR). Para la AR, este medicamento reduce el proceso de la inflamacin y [place] a reducir la hinchazn y Financial controller. Este medicamento se utiliza a menudo con otros medicamentos para Management consultant o la artritis. Este medicamento puede ser utilizado para otros usos; si tiene alguna pregunta consulte con su proveedor de atencin mdica o con su farmacutico. Qu le debo informar a mi profesional de la salud antes  de tomar este medicamento? Necesita saber si usted presenta alguno de los siguientes problemas o situaciones: -trastornos sanguneos -enfermedad cardiaca -antecedentes de hepatitis B -infeccin (especialmente infecciones virales, como la varicela o el herpes) -ritmo cardiaco anormal -enfermedad renal -enfermedad pulmonar o respiratoria, como asma -lupus -una reaccin alrgica o inusual al rituximab, a las protenas de ratn, a otros medicamentos, alimentos, colorantes o conservantes -si est embarazada o buscando quedar embarazada -si est amamantando a un beb Cmo debo utilizar este medicamento? Este medicamento se administra mediante infusin por va intravenosa. Lo administra un profesional de Radiographer, therapeutic calificado en un hospital o en un entorno clnico. Su farmacutico le dar una [place] del medicamento especial con cada receta y relleno. Asegrese de leer esta informacin cada vez cuidadosamente. Hable con su pediatra para informarse acerca del uso de este medicamento en nios. Este medicamento no est aprobado para uso en nios. Sobredosis: Pngase en contacto inmediatamente con un centro toxicolgico o una sala de urgencia si usted cree que haya tomado demasiado medicamento. ATENCIN: [employer] es solo para usted. No comparta este medicamento con nadie. Qu sucede si me olvido de una dosis? Es importante no olvidar ninguna dosis. Informe a su mdico o a su profesional de la salud si no puede asistir a Marketing executive. Qu puede interactuar con este medicamento? -cisplatino -medicamentos para la presin sangunea -algunos medicamentos para la artritis -vacunas Puede ser que esta lista no menciona todas las posibles interacciones. Informe a su profesional de [employer] de [employer] productos a base de hierbas, medicamentos de [place] o suplementos nutritivos que est tomando. Si usted fuma, consume bebidas alcohlicas o si [employer],  indqueselo tambin a su profesional  de [employer]. Algunas sustancias pueden interactuar con su medicamento. A qu debo estar atento al usar [employer]? Informe de inmediato cualquier [employer] que nota durante su tratamiento, como cambios en su respiracin, fiebre, escalofros, mareos o aturdimiento. Estos efectos son ms comunes con la primera dosis. Visite a quien extiende sus recetas o a su profesional de la salud para International aid/development worker su evolucin. Tendr que hacerse anlisis de sangre peridicos. Si se presentan otros efectos secundarios, infrmelos lo antes posible. Los efectos secundarios de este medicamento pueden continuar al Advertising account planner. Sin embargo, contine con el tratamiento aun si se siente enfermo, a menos que su mdico le indique que lo suspenda. Consulte a su mdico o a su profesional de la salud por asesoramiento si tiene fiebre, escalofros, dolor de garganta o cualquier otro sntoma de resfro o gripe. No se trate usted mismo. Este medicamento puede reducir la capacidad del cuerpo para combatir infecciones. Trate de no acercarse a personas que estn enfermas. [employer] puede aumentar el riesgo de magulladuras o sangrado. Consulte a su mdico o a su profesional de la salud si observa sangrados inusuales. Proceda con cuidado al cepillar sus dientes, usar hilo dental o Chemical engineer palillos para los dientes, ya que puede contraer una infeccin o Geophysicist/field seismologist con mayor facilidad. Si se somete a algn tratamiento dental, informe a su dentista que est [employer]. Evite tomar productos que contienen aspirina, acetaminofeno, ibuprofeno, naproxeno o quetoprofeno a menos que as lo indique su mdico. Estos productos pueden disimular la fiebre. No se debe quedar embarazada mientras reciba este medicamento. Las mujeres deben informar a su mdico si estn buscando quedar embarazadas o si creen que estn embarazadas. Existe la posibilidad de efectos secundarios graves a un beb sin nacer. Para ms  informacin hable con su profesional de la salud o su farmacutico. No debe amamantar a un beb mientras est tomando este medicamento. Qu efectos secundarios puedo tener al [employer] este medicamento? Efectos secundarios que debe informar a su mdico o a Producer, television/film/video de la salud tan pronto como sea posible: -reacciones alrgicas como erupcin cutnea, picazn o urticarias, hinchazn de la cara, labios o lengua -conteos sanguneos bajos - este medicamento puede reducir la cantidad de glbulos blancos, glbulos rojos y plaquetas. Su riesgo de infeccin y Radiographer, therapeutic ser mayor. -signos de infeccin - fiebre o escalofros, tos, dolor de garganta, Engineer, mining o dificultad para orinar -signos de reduccin de plaquetas o sangrado - magulladuras, puntos rojos en la piel, heces de color oscuro o con aspecto alquitranado, sangre en la orina -signos de reduccin de glbulos rojos - cansancio o debilidad inusual, desmayos, sensacin de mareo -problemas respiratorios -confusin, no responde -dolor en el pecho -pulso cardiaco rpido, irregular -sensacin de desmayos o mareos, cadas -llagas en la boca -enrojecimiento, formacin de ampollas, descamacin o distensin de la piel, inclusive dentro de la boca -dolor estomacal -hinchazn de tobillos, pies o manos -dificultad para orinar o cambios en el volumen de orina Efectos secundarios que, por lo general, no requieren atencin mdica (debe informarlos a su mdico o a su profesional de la salud si persisten o si son molestos): -ansiedad -dolor de [place] -prdida del apetito -dolores musculares -nuseas -sudoracin nocturna Puede ser que esta lista no menciona todos los posibles efectos secundarios. Comunquese a su mdico por asesoramiento mdico [employer]. Usted puede informar los efectos secundarios a la FDA por telfono al 1-800-FDA-1088. Dnde debo guardar mi medicina? Este medicamento se [employer]  en hospitales o clnicas y no  necesitar guardarlo en su domicilio. ATENCIN: Este folleto es un resumen. Puede ser que no cubra toda la posible informacin. Si usted tiene preguntas acerca de esta medicina, consulte con su mdico, su farmacutico o su profesional de Radiographer, therapeutic.    2016, Elsevier/Gold Standard. (2015-04-23 00:00:00)

## 2016-01-28 NOTE — Progress Notes (Signed)
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 He was recently hospitalized for similar reason of in April 2016, twice, and had received IVIG and corticosteroid therapy. Please see my initial consult note dated 03/26/2015 for further details. The patient was started on IVIG, dexamethasone, Solu-Medrol and prednisone. He is consider steroid refractory. On 04/23/2015, he is started on CellCept 250 mg twice a day From 04/30/2015 to 04/28/2015, he was readmitted to the hospital due to bleeding. Bone marrow aspirate and biopsy performed on 04/27/2015 confirmed diagnosis of ITP. He was started also in addition to all the above, Nplate injection weekly On 05/04/2015, he received his last platelet transfusion. Starting 05/07/2015, prednisone taper was initiated. On 05/31/2015, prednisone was discontinued and the patient was placed on CellCept along with Nplate  on 07/14/2015, CellCept was reduced to 500 mg twice a day along with weekly Nplate injection On 09/10/2015, he suffered from relapse due to recent infection. 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 He received 1 unit of platelet transfusion The patient was readmitted to the hospital from 12/26/2015 to 12/27/2015 with relapsed ITP. He responded to high-dose IVIG and steroids The patient was readmitted to the hospital from 01/17/2016 to 01/19/2016 with relapsed ITP. He responded to high-dose IVIG and steroids. Cellcept was discontinued The patient was readmitted to the hospital from 01/24/2016 to 01/26/2016 with relapsed ITP. He responded to high-dose IVIG and steroids.  INTERVAL HISTORY: History is obtained via  interpreter John Brennan 30 y.o. male returns for further follow-up. He feels well. The patient denies any recent signs or symptoms of bleeding such as spontaneous epistaxis, hematuria or hematochezia.   I have reviewed the past medical history, past surgical history, social history and family history with the patient and they are unchanged from previous note.  ALLERGIES:  has No Known Allergies.  MEDICATIONS:  No current outpatient prescriptions on file.   No current facility-administered medications for this visit.     REVIEW OF SYSTEMS:   Constitutional: Denies fevers, chills or night sweats Eyes: Denies blurriness of vision Ears, nose, mouth, throat, and face: Denies mucositis or sore throat Respiratory: Denies cough, dyspnea or wheezes Cardiovascular: Denies palpitation, chest discomfort or lower extremity swelling Gastrointestinal:  Denies nausea, heartburn or change in bowel habits Skin: Denies abnormal skin rashes Lymphatics: Denies new lymphadenopathy or easy bruising Neurological:Denies numbness, tingling or new weaknesses Behavioral/Psych: Mood is stable, no new changes  All other systems were reviewed with the patient and are negative.  PHYSICAL EXAMINATION: ECOG PERFORMANCE STATUS: 0 - Asymptomatic  Filed Vitals:   01/28/16 0835  BP: 120/60  Pulse: 85  Temp: 97.8 F (36.6 C)  Resp: 18   Filed Weights   01/28/16 0835  Weight: 151 lb 11.2 oz (68.811 kg)    GENERAL:alert, no distress and comfortable SKIN: skin color, texture, turgor are normal, no rashes or significant lesions EYES: normal, Conjunctiva are pink and non-injected, sclera clear OROPHARYNX:no exudate, no erythema and lips, buccal mucosa, and tongue normal  NECK: supple, thyroid normal size, non-tender, without nodularity LYMPH:  no palpable lymphadenopathy in the cervical, axillary or inguinal LUNGS: clear to auscultation and percussion with normal breathing effort HEART: regular rate &  rhythm and no murmurs and  no lower extremity edema ABDOMEN:abdomen soft, non-tender and normal bowel sounds Musculoskeletal:no cyanosis of digits and no clubbing  NEURO: alert & oriented x 3 with fluent speech, no focal motor/sensory deficits  LABORATORY DATA:  I have reviewed the data as listed Results for orders placed or performed in visit on 01/28/16 (from the past 48 hour(s))  CBC with Differential/Platelet     Status: Abnormal   Collection Time: 01/28/16  8:16 AM  Result Value Ref Range   WBC 13.3 (H) 4.0 - 10.3 10e3/uL   NEUT# 9.4 (H) 1.5 - 6.5 10e3/uL   HGB 15.3 13.0 - 17.1 g/dL   HCT 69.6 29.5 - 28.4 %   Platelets 398 140 - 400 10e3/uL   MCV 82.0 79.3 - 98.0 fL   MCH 26.5 (L) 27.2 - 33.4 pg   MCHC 32.3 32.0 - 36.0 g/dL   RBC 1.32 4.40 - 1.02 10e6/uL   RDW 14.3 11.0 - 14.6 %   lymph# 3.0 0.9 - 3.3 10e3/uL   MONO# 0.7 0.1 - 0.9 10e3/uL   Eosinophils Absolute 0.1 0.0 - 0.5 10e3/uL   Basophils Absolute 0.0 0.0 - 0.1 10e3/uL   NEUT% 70.9 39.0 - 75.0 %   LYMPH% 22.7 14.0 - 49.0 %   MONO% 5.4 0.0 - 14.0 %   EOS% 0.8 0.0 - 7.0 %   BASO% 0.2 0.0 - 2.0 %   nRBC 0 0 - 0 %    Lab Results  Component Value Date   WBC 13.3* 01/28/2016   HGB 15.3 01/28/2016   HCT 47.4 01/28/2016   MCV 82.0 01/28/2016   PLT 398 01/28/2016    ASSESSMENT & PLAN:  ITP (idiopathic thrombocytopenic purpura) Via an interpreter, I discussed with the patient treatment options. Despite being on Nplate and CellCept, he had multiple recurrence/relapses of ITP. We discussed potential splenectomy or trial of rituximab. We also discussed monthly IVIG, cyclosporine or azathioprine I recommend we continue Nplate until he had received at least 4 doses of rituximab I have rescheduled rituximab to be given weekly starting today for the remaining of his 3 doses I will see him next week but he will come to twice a week to get his blood work monitored    All questions were answered. The patient knows to call  the clinic with any problems, questions or concerns. No barriers to learning was detected.  I spent 15 minutes counseling the patient face to face. The total time spent in the appointment was 20 minutes and more than 50% was on counseling.     [facility], Kolleen Ochsner, MD 2/3/20178:57 AM

## 2016-01-31 ENCOUNTER — Ambulatory Visit: Payer: Self-pay | Admitting: Hematology and Oncology

## 2016-01-31 ENCOUNTER — Other Ambulatory Visit: Payer: Self-pay

## 2016-01-31 ENCOUNTER — Other Ambulatory Visit (HOSPITAL_BASED_OUTPATIENT_CLINIC_OR_DEPARTMENT_OTHER): Payer: Self-pay

## 2016-01-31 ENCOUNTER — Ambulatory Visit: Payer: Self-pay

## 2016-01-31 DIAGNOSIS — D693 Immune thrombocytopenic purpura: Secondary | ICD-10-CM

## 2016-01-31 LAB — CBC WITH DIFFERENTIAL/PLATELET
BASO%: 0.5 % (ref 0.0–2.0)
Basophils Absolute: 0.1 10*3/uL (ref 0.0–0.1)
EOS%: 2 % (ref 0.0–7.0)
Eosinophils Absolute: 0.3 10*3/uL (ref 0.0–0.5)
HCT: 48.8 % (ref 38.4–49.9)
HEMOGLOBIN: 15.8 g/dL (ref 13.0–17.1)
LYMPH%: 12.6 % — AB (ref 14.0–49.0)
MCH: 25.8 pg — ABNORMAL LOW (ref 27.2–33.4)
MCHC: 32.5 g/dL (ref 32.0–36.0)
MCV: 79.3 fL (ref 79.3–98.0)
MONO#: 1 10*3/uL — ABNORMAL HIGH (ref 0.1–0.9)
MONO%: 6.5 % (ref 0.0–14.0)
NEUT%: 78.4 % — ABNORMAL HIGH (ref 39.0–75.0)
NEUTROS ABS: 12.2 10*3/uL — AB (ref 1.5–6.5)
Platelets: 190 10*3/uL (ref 140–400)
RBC: 6.15 10*6/uL — AB (ref 4.20–5.82)
RDW: 14.6 % (ref 11.0–14.6)
WBC: 15.6 10*3/uL — AB (ref 4.0–10.3)
lymph#: 2 10*3/uL (ref 0.9–3.3)

## 2016-02-04 ENCOUNTER — Ambulatory Visit (HOSPITAL_BASED_OUTPATIENT_CLINIC_OR_DEPARTMENT_OTHER): Payer: Self-pay

## 2016-02-04 ENCOUNTER — Other Ambulatory Visit (HOSPITAL_BASED_OUTPATIENT_CLINIC_OR_DEPARTMENT_OTHER): Payer: Self-pay

## 2016-02-04 ENCOUNTER — Other Ambulatory Visit: Payer: Self-pay | Admitting: Hematology and Oncology

## 2016-02-04 VITALS — BP 116/73 | HR 65 | Temp 98.8°F | Resp 18

## 2016-02-04 DIAGNOSIS — D696 Thrombocytopenia, unspecified: Secondary | ICD-10-CM

## 2016-02-04 DIAGNOSIS — D693 Immune thrombocytopenic purpura: Secondary | ICD-10-CM

## 2016-02-04 DIAGNOSIS — Z5112 Encounter for antineoplastic immunotherapy: Secondary | ICD-10-CM

## 2016-02-04 LAB — CBC WITH DIFFERENTIAL/PLATELET
BASO%: 0.2 % (ref 0.0–2.0)
Basophils Absolute: 0 10*3/uL (ref 0.0–0.1)
EOS ABS: 0.2 10*3/uL (ref 0.0–0.5)
EOS%: 2.5 % (ref 0.0–7.0)
HEMATOCRIT: 46.7 % (ref 38.4–49.9)
HGB: 15.4 g/dL (ref 13.0–17.1)
LYMPH%: 17.4 % (ref 14.0–49.0)
MCH: 26.7 pg — ABNORMAL LOW (ref 27.2–33.4)
MCHC: 33 g/dL (ref 32.0–36.0)
MCV: 81.1 fL (ref 79.3–98.0)
MONO#: 1 10*3/uL — AB (ref 0.1–0.9)
MONO%: 10.4 % (ref 0.0–14.0)
NEUT%: 69.5 % (ref 39.0–75.0)
NEUTROS ABS: 6.5 10*3/uL (ref 1.5–6.5)
PLATELETS: 17 10*3/uL — AB (ref 140–400)
RBC: 5.76 10*6/uL (ref 4.20–5.82)
RDW: 13.9 % (ref 11.0–14.6)
WBC: 9.3 10*3/uL (ref 4.0–10.3)
lymph#: 1.6 10*3/uL (ref 0.9–3.3)
nRBC: 0 % (ref 0–0)

## 2016-02-04 MED ORDER — DEXAMETHASONE 4 MG PO TABS
ORAL_TABLET | ORAL | Status: AC
  Filled 2016-02-04: qty 10

## 2016-02-04 MED ORDER — ACETAMINOPHEN 325 MG PO TABS
650.0000 mg | ORAL_TABLET | Freq: Once | ORAL | Status: AC
  Administered 2016-02-04: 650 mg via ORAL

## 2016-02-04 MED ORDER — ACETAMINOPHEN 325 MG PO TABS
ORAL_TABLET | ORAL | Status: AC
  Filled 2016-02-04: qty 2

## 2016-02-04 MED ORDER — HEPARIN SOD (PORK) LOCK FLUSH 100 UNIT/ML IV SOLN
500.0000 [IU] | Freq: Once | INTRAVENOUS | Status: DC | PRN
Start: 2016-02-04 — End: 2016-02-22
  Filled 2016-02-04: qty 5

## 2016-02-04 MED ORDER — DEXAMETHASONE 4 MG PO TABS
40.0000 mg | ORAL_TABLET | Freq: Every day | ORAL | Status: DC
  Administered 2016-02-04: 40 mg via ORAL

## 2016-02-04 MED ORDER — SODIUM CHLORIDE 0.9 % IV SOLN
Freq: Once | INTRAVENOUS | Status: AC
  Administered 2016-02-04: 13:00:00 via INTRAVENOUS

## 2016-02-04 MED ORDER — SODIUM CHLORIDE 0.9 % IV SOLN
375.0000 mg/m2 | Freq: Once | INTRAVENOUS | Status: AC
  Administered 2016-02-04: 700 mg via INTRAVENOUS
  Filled 2016-02-04: qty 60

## 2016-02-04 MED ORDER — DIPHENHYDRAMINE HCL 25 MG PO CAPS
ORAL_CAPSULE | ORAL | Status: AC
  Filled 2016-02-04: qty 2

## 2016-02-04 MED ORDER — SODIUM CHLORIDE 0.9 % IJ SOLN
10.0000 mL | INTRAMUSCULAR | Status: DC | PRN
Start: 2016-02-04 — End: 2016-02-22
  Filled 2016-02-04: qty 10

## 2016-02-04 MED ORDER — DIPHENHYDRAMINE HCL 25 MG PO CAPS
50.0000 mg | ORAL_CAPSULE | Freq: Once | ORAL | Status: AC
  Administered 2016-02-04: 50 mg via ORAL

## 2016-02-04 MED ORDER — ROMIPLOSTIM INJECTION 500 MCG
530.0000 ug | SUBCUTANEOUS | Status: DC
  Administered 2016-02-04: 530 ug via SUBCUTANEOUS
  Filled 2016-02-04: qty 1.06

## 2016-02-04 NOTE — Progress Notes (Unsigned)
At 1228 Dr. Bertis Ruddy called to report patients rash to back and scalp since Tuesday.  No new orders given

## 2016-02-04 NOTE — Patient Instructions (Addendum)
[facility] Discharge Instructions for Patients Receiving Chemotherapy  Today you received the following chemotherapy agents Rituxan.  To help prevent nausea and vomiting after your treatment, we encourage you to take your nausea medication as prescribed.   If you develop nausea and vomiting that is not controlled by your nausea medication, call the clinic.   BELOW ARE SYMPTOMS THAT SHOULD BE REPORTED IMMEDIATELY:  *FEVER GREATER THAN 100.5 F  *CHILLS WITH OR WITHOUT FEVER  NAUSEA AND VOMITING THAT IS NOT CONTROLLED WITH YOUR NAUSEA MEDICATION  *UNUSUAL SHORTNESS OF BREATH  *UNUSUAL BRUISING OR BLEEDING  TENDERNESS IN MOUTH AND THROAT WITH OR WITHOUT PRESENCE OF ULCERS  *URINARY PROBLEMS  *BOWEL PROBLEMS  UNUSUAL RASH Items with * indicate a potential emergency and should be followed up as soon as possible.  Feel free to call the clinic you have any questions or concerns. The clinic phone number is [phone].  Please show the CHEMO ALERT CARD at check-in to the Emergency Department and triage nurse.  Rituximab injection Qu es este medicamento? El RITUXIMAB es un anticuerpo monoclonal. Este medicamento cambia la forma en que funciona el sistema inmunolgico. Se utiliza comnmente para el tratamiento del linfoma no Hodgkin y Lexicographer. En las clulas cancerosas, este medicamento acta sobre una protena especfica que se encuentra en las clulas cancerosas y detiene su crecimiento. Se utiliza tambin para el tratamiento de artritis reumatoide (AR). Para la AR, este medicamento reduce el proceso de la inflamacin y [place] a reducir la hinchazn y Financial controller. Este medicamento se utiliza a menudo con otros medicamentos para Management consultant o la artritis. Este medicamento puede ser utilizado para otros usos; si tiene alguna pregunta consulte con su proveedor de atencin mdica o con su farmacutico. Qu le debo informar a mi profesional de la salud antes  de tomar este medicamento? Necesita saber si usted presenta alguno de los siguientes problemas o situaciones: -trastornos sanguneos -enfermedad cardiaca -antecedentes de hepatitis B -infeccin (especialmente infecciones virales, como la varicela o el herpes) -ritmo cardiaco anormal -enfermedad renal -enfermedad pulmonar o respiratoria, como asma -lupus -una reaccin alrgica o inusual al rituximab, a las protenas de ratn, a otros medicamentos, alimentos, colorantes o conservantes -si est embarazada o buscando quedar embarazada -si est amamantando a un beb Cmo debo utilizar este medicamento? Este medicamento se administra mediante infusin por va intravenosa. Lo administra un profesional de Radiographer, therapeutic calificado en un hospital o en un entorno clnico. Su farmacutico le dar una [place] del medicamento especial con cada receta y relleno. Asegrese de leer esta informacin cada vez cuidadosamente. Hable con su pediatra para informarse acerca del uso de este medicamento en nios. Este medicamento no est aprobado para uso en nios. Sobredosis: Pngase en contacto inmediatamente con un centro toxicolgico o una sala de urgencia si usted cree que haya tomado demasiado medicamento. ATENCIN: [employer] es solo para usted. No comparta este medicamento con nadie. Qu sucede si me olvido de una dosis? Es importante no olvidar ninguna dosis. Informe a su mdico o a su profesional de la salud si no puede asistir a Marketing executive. Qu puede interactuar con este medicamento? -cisplatino -medicamentos para la presin sangunea -algunos medicamentos para la artritis -vacunas Puede ser que esta lista no menciona todas las posibles interacciones. Informe a su profesional de [employer] de [employer] productos a base de hierbas, medicamentos de [place] o suplementos nutritivos que est tomando. Si usted fuma, consume bebidas alcohlicas o si [employer],  indqueselo tambin a su profesional  de [employer]. Algunas sustancias pueden interactuar con su medicamento. A qu debo estar atento al usar [employer]? Informe de inmediato cualquier [employer] que nota durante su tratamiento, como cambios en su respiracin, fiebre, escalofros, mareos o aturdimiento. Estos efectos son ms comunes con la primera dosis. Visite a quien extiende sus recetas o a su profesional de la salud para International aid/development worker su evolucin. Tendr que hacerse anlisis de sangre peridicos. Si se presentan otros efectos secundarios, infrmelos lo antes posible. Los efectos secundarios de este medicamento pueden continuar al Advertising account planner. Sin embargo, contine con el tratamiento aun si se siente enfermo, a menos que su mdico le indique que lo suspenda. Consulte a su mdico o a su profesional de la salud por asesoramiento si tiene fiebre, escalofros, dolor de garganta o cualquier otro sntoma de resfro o gripe. No se trate usted mismo. Este medicamento puede reducir la capacidad del cuerpo para combatir infecciones. Trate de no acercarse a personas que estn enfermas. [employer] puede aumentar el riesgo de magulladuras o sangrado. Consulte a su mdico o a su profesional de la salud si observa sangrados inusuales. Proceda con cuidado al cepillar sus dientes, usar hilo dental o Chemical engineer palillos para los dientes, ya que puede contraer una infeccin o Geophysicist/field seismologist con mayor facilidad. Si se somete a algn tratamiento dental, informe a su dentista que est [employer]. Evite tomar productos que contienen aspirina, acetaminofeno, ibuprofeno, naproxeno o quetoprofeno a menos que as lo indique su mdico. Estos productos pueden disimular la fiebre. No se debe quedar embarazada mientras reciba este medicamento. Las mujeres deben informar a su mdico si estn buscando quedar embarazadas o si creen que estn embarazadas. Existe la posibilidad de efectos secundarios graves a un beb sin nacer. Para ms  informacin hable con su profesional de la salud o su farmacutico. No debe amamantar a un beb mientras est tomando este medicamento. Qu efectos secundarios puedo tener al [employer] este medicamento? Efectos secundarios que debe informar a su mdico o a Producer, television/film/video de la salud tan pronto como sea posible: -reacciones alrgicas como erupcin cutnea, picazn o urticarias, hinchazn de la cara, labios o lengua -conteos sanguneos bajos - este medicamento puede reducir la cantidad de glbulos blancos, glbulos rojos y plaquetas. Su riesgo de infeccin y Radiographer, therapeutic ser mayor. -signos de infeccin - fiebre o escalofros, tos, dolor de garganta, Engineer, mining o dificultad para orinar -signos de reduccin de plaquetas o sangrado - magulladuras, puntos rojos en la piel, heces de color oscuro o con aspecto alquitranado, sangre en la orina -signos de reduccin de glbulos rojos - cansancio o debilidad inusual, desmayos, sensacin de mareo -problemas respiratorios -confusin, no responde -dolor en el pecho -pulso cardiaco rpido, irregular -sensacin de desmayos o mareos, cadas -llagas en la boca -enrojecimiento, formacin de ampollas, descamacin o distensin de la piel, inclusive dentro de la boca -dolor estomacal -hinchazn de tobillos, pies o manos -dificultad para orinar o cambios en el volumen de orina Efectos secundarios que, por lo general, no requieren atencin mdica (debe informarlos a su mdico o a su profesional de la salud si persisten o si son molestos): -ansiedad -dolor de [place] -prdida del apetito -dolores musculares -nuseas -sudoracin nocturna Puede ser que esta lista no menciona todos los posibles efectos secundarios. Comunquese a su mdico por asesoramiento mdico [employer]. Usted puede informar los efectos secundarios a la FDA por telfono al 1-800-FDA-1088. Dnde debo guardar mi medicina? Este medicamento se [employer]  en hospitales o clnicas y no  necesitar guardarlo en su domicilio. ATENCIN: Este folleto es un resumen. Puede ser que no cubra toda la posible informacin. Si usted tiene preguntas acerca de esta medicina, consulte con su mdico, su farmacutico o su profesional de Radiographer, therapeutic.    2016, Elsevier/Gold Standard. (2015-04-23 00:00:00) Transfusin de plaquetas  (Platelet Transfusion) La transfusin de plaquetas es un procedimiento en el cual se reciben plaquetas de un donante a travs de una va intravenosa (IV). Las plaquetas son fragmentos diminutos de glbulos sanguneos. Cuando un vaso sanguneo est daado, las plaquetas se acumulan en la zona del dao para favorecer la formacin de un cogulo sanguneo. De este modo comienza el proceso de Editor, commissioning. Si el recuento de plaquetas es [place], es posible que la sangre tenga problemas para Biochemist, clinical.  Tal vez deba recibir una transfusin de plaquetas si tiene una enfermedad que reduce el nmero de plaquetas (trombocitopenia). Se puede realizar una transfusin de plaquetas para detener o evitar una hemorragia.  INFORME A SU MDICO:   Cualquier alergia que tenga.   Todos los [employer], incluidos vitaminas, hierbas, gotas oftlmicas, cremas y [address] de [address].   Problemas previos que usted o los [employer] de su familia hayan tenido con el uso de anestsicos.   Enfermedades de la sangre que tenga.   Si tiene cirugas previas.   Si tiene [employer].   Las reacciones que haya tenido durante una transfusin previa. RIESGOS Y COMPLICACIONES En general, se trata de un procedimiento seguro. Sin embargo, pueden presentarse problemas, por ejemplo:   Tener fiebre con o sin escalofros. Generalmente, la fiebre se presenta durante las primeras 4horas despus de la transfusin y se normaliza antes de transcurridas 48horas.  Reacciones alrgicas. La causa ms frecuente de la reaccin son los anticuerpos que fabrica el organismo contra las  sustancias de la transfusin. Los signos de [place] reaccin alrgica pueden incluir picazn, ronchas, dificultad para respirar, shock o baja presin arterial.  Reaccin hemoltica repentina (aguda) o tarda. Esta reaccin atpica puede presentarse durante la transfusin y [place] 28das despus de esta. La reaccin suele ocurrir cuando el sistema de defensa del organismo (sistema inmunitario) ataca a las nuevas plaquetas. Los signos de [place] reaccin hemoltica pueden incluir fiebre, dolor de cabeza, dificultad para respirar, baja presin arterial, latidos cardacos rpidos o dolor de espalda, abdomen, pecho o en el lugar donde se coloc la va intravenosa (IV).  Lesin pulmonar aguda relacionada con la transfusin (LPART). Esta lesin puede presentarse algunas horas o varios das despus de una transfusin. Esta es una reaccin poco frecuente que causa dao pulmonar. La causa no se conoce.  Infeccin. Los signos de esta complicacin atpica pueden incluir fiebre, escalofros, vmitos, latidos cardacos rpidos o baja presin arterial. ANTES DEL PROCEDIMIENTO   Es posible que le realicen un anlisis de sangre para determinar su tipo de [place]. Esto es necesario para Financial risk analyst qu tipos de plaquetas son las ms compatibles con las suyas.  Si tuvo una reaccin alrgica a una transfusin en el pasado, tal vez le administren un medicamento que ayude a evitarla. Tome este medicamento solamente como se lo haya indicado el mdico.  Le controlarn la temperatura, la presin arterial y el pulso antes de la transfusin. PROCEDIMIENTO  Le insertarn una va intravenosa en el brazo o la [place].  La transfusin estar conectada a la va intravenosa. La bolsa que contiene las plaquetas del donante estar conectada a la va intravenosa (IV), y estas se administrarn en  la vena.  Le controlarn la temperatura, la presin arterial y el pulso peridicamente durante la transfusin. Este control se realiza para ayudar a  Engineer, manufacturing signos tempranos de una reaccin a la transfusin.  Si tiene signos o sntomas de Corporate treasurer, se suspender la transfusin y tal vez le administren un medicamento.  Una vez finalizada la transfusin, se retirar la va intravenosa.  Se puede aplicar presin en el lugar donde se coloc la va intravenosa.  Le colocarn una venda (vendaje). Este procedimiento puede variar segn el mdico y el hospital. DESPUS DEL PROCEDIMIENTO  Le controlarn la presin arterial, la temperatura y el pulso peridicamente.   Esta informacin no tiene Theme park manager el consejo del mdico. Asegrese de hacerle al mdico cualquier pregunta que tenga.   Document Released: 03/29/2009 Document Revised: 01/01/2015 Elsevier Interactive Patient Education 2016 [employer].  Transfusin de plaquetas  (Platelet Transfusion) La transfusin de plaquetas es un procedimiento en el cual se reciben plaquetas de un donante a travs de una va intravenosa (IV). Las plaquetas son fragmentos diminutos de glbulos sanguneos. Cuando un vaso sanguneo est daado, las plaquetas se acumulan en la zona del dao para favorecer la formacin de un cogulo sanguneo. De este modo comienza el proceso de Editor, commissioning. Si el recuento de plaquetas es [place], es posible que la sangre tenga problemas para Biochemist, clinical.  Tal vez deba recibir una transfusin de plaquetas si tiene una enfermedad que reduce el nmero de plaquetas (trombocitopenia). Se puede realizar una transfusin de plaquetas para detener o evitar una hemorragia.  INFORME A SU MDICO:   Cualquier alergia que tenga.   Todos los [employer], incluidos vitaminas, hierbas, gotas oftlmicas, cremas y [address] de [address].   Problemas previos que usted o los [employer] de su familia hayan tenido con el uso de anestsicos.   Enfermedades de la sangre que tenga.   Si tiene cirugas previas.   Si tiene [employer].   Las reacciones  que haya tenido durante una transfusin previa. RIESGOS Y COMPLICACIONES En general, se trata de un procedimiento seguro. Sin embargo, pueden presentarse problemas, por ejemplo:   Tener fiebre con o sin escalofros. Generalmente, la fiebre se presenta durante las primeras 4horas despus de la transfusin y se normaliza antes de transcurridas 48horas.  Reacciones alrgicas. La causa ms frecuente de la reaccin son los anticuerpos que fabrica el organismo contra las sustancias de la transfusin. Los signos de [place] reaccin alrgica pueden incluir picazn, ronchas, dificultad para respirar, shock o baja presin arterial.  Reaccin hemoltica repentina (aguda) o tarda. Esta reaccin atpica puede presentarse durante la transfusin y [place] 28das despus de esta. La reaccin suele ocurrir cuando el sistema de defensa del organismo (sistema inmunitario) ataca a las nuevas plaquetas. Los signos de [place] reaccin hemoltica pueden incluir fiebre, dolor de cabeza, dificultad para respirar, baja presin arterial, latidos cardacos rpidos o dolor de espalda, abdomen, pecho o en el lugar donde se coloc la va intravenosa (IV).  Lesin pulmonar aguda relacionada con la transfusin (LPART). Esta lesin puede presentarse algunas horas o varios das despus de una transfusin. Esta es una reaccin poco frecuente que causa dao pulmonar. La causa no se conoce.  Infeccin. Los signos de esta complicacin atpica pueden incluir fiebre, escalofros, vmitos, latidos cardacos rpidos o baja presin arterial. ANTES DEL PROCEDIMIENTO   Es posible que le realicen un anlisis de sangre para determinar su tipo de [place]. Esto es necesario para Financial risk analyst qu tipos de plaquetas son las ms compatibles  con las suyas.  Si tuvo una reaccin alrgica a una transfusin en el pasado, tal vez le administren un medicamento que ayude a evitarla. Tome este medicamento solamente como se lo haya indicado el mdico.  Le  controlarn la temperatura, la presin arterial y el pulso antes de la transfusin. PROCEDIMIENTO  Le insertarn una va intravenosa en el brazo o la [place].  La transfusin estar conectada a la va intravenosa. La bolsa que contiene las plaquetas del donante estar conectada a la va intravenosa (IV), y estas se administrarn en la vena.  Le controlarn la temperatura, la presin arterial y el pulso peridicamente durante la transfusin. Este control se realiza para ayudar a Engineer, manufacturing signos tempranos de una reaccin a la transfusin.  Si tiene signos o sntomas de Corporate treasurer, se suspender la transfusin y tal vez le administren un medicamento.  Una vez finalizada la transfusin, se retirar la va intravenosa.  Se puede aplicar presin en el lugar donde se coloc la va intravenosa.  Le colocarn una venda (vendaje). Este procedimiento puede variar segn el mdico y el hospital. DESPUS DEL PROCEDIMIENTO  Le controlarn la presin arterial, la temperatura y el pulso peridicamente.   Esta informacin no tiene Theme park manager el consejo del mdico. Asegrese de hacerle al mdico cualquier pregunta que tenga.   Document Released: 03/29/2009 Document Revised: 01/01/2015 Elsevier Interactive Patient Education [employer]

## 2016-02-04 NOTE — Progress Notes (Unsigned)
Pt premedicated with tylenol and benadryl for rituxan. Per [facility], Dr Bertis Ruddy , may hold premeds for platelets.

## 2016-02-06 LAB — PREPARE PLATELET PHERESIS: UNIT DIVISION: 0

## 2016-02-07 ENCOUNTER — Other Ambulatory Visit (HOSPITAL_BASED_OUTPATIENT_CLINIC_OR_DEPARTMENT_OTHER): Payer: Self-pay

## 2016-02-07 DIAGNOSIS — D693 Immune thrombocytopenic purpura: Secondary | ICD-10-CM

## 2016-02-07 LAB — CBC WITH DIFFERENTIAL/PLATELET
BASO%: 1 % (ref 0.0–2.0)
Basophils Absolute: 0.2 10*3/uL — ABNORMAL HIGH (ref 0.0–0.1)
EOS ABS: 0.1 10*3/uL (ref 0.0–0.5)
EOS%: 0.6 % (ref 0.0–7.0)
HCT: 46.5 % (ref 38.4–49.9)
HEMOGLOBIN: 15.2 g/dL (ref 13.0–17.1)
LYMPH%: 16.3 % (ref 14.0–49.0)
MCH: 26 pg — ABNORMAL LOW (ref 27.2–33.4)
MCHC: 32.7 g/dL (ref 32.0–36.0)
MCV: 79.4 fL (ref 79.3–98.0)
MONO#: 1.2 10*3/uL — AB (ref 0.1–0.9)
MONO%: 7.5 % (ref 0.0–14.0)
NEUT%: 74.6 % (ref 39.0–75.0)
NEUTROS ABS: 11.8 10*3/uL — AB (ref 1.5–6.5)
PLATELETS: 269 10*3/uL (ref 140–400)
RBC: 5.85 10*6/uL — ABNORMAL HIGH (ref 4.20–5.82)
RDW: 15.1 % — AB (ref 11.0–14.6)
WBC: 15.9 10*3/uL — AB (ref 4.0–10.3)
lymph#: 2.6 10*3/uL (ref 0.9–3.3)

## 2016-02-09 ENCOUNTER — Telehealth: Payer: Self-pay | Admitting: Hematology and Oncology

## 2016-02-09 ENCOUNTER — Telehealth: Payer: Self-pay | Admitting: *Deleted

## 2016-02-09 ENCOUNTER — Encounter (HOSPITAL_COMMUNITY): Payer: Self-pay

## 2016-02-09 ENCOUNTER — Other Ambulatory Visit: Payer: Self-pay | Admitting: Hematology and Oncology

## 2016-02-09 ENCOUNTER — Ambulatory Visit (HOSPITAL_BASED_OUTPATIENT_CLINIC_OR_DEPARTMENT_OTHER): Payer: Self-pay

## 2016-02-09 ENCOUNTER — Inpatient Hospital Stay (HOSPITAL_COMMUNITY)
Admission: AD | Admit: 2016-02-09 | Discharge: 2016-02-11 | DRG: 813 | Disposition: A | Discharge status: Home / Self Care | Payer: Medicaid Other | Source: Ambulatory Visit | Attending: Hematology and Oncology | Admitting: Hematology and Oncology

## 2016-02-09 DIAGNOSIS — R7989 Other specified abnormal findings of blood chemistry: Secondary | ICD-10-CM | POA: Diagnosis present

## 2016-02-09 DIAGNOSIS — D693 Immune thrombocytopenic purpura: Secondary | ICD-10-CM

## 2016-02-09 DIAGNOSIS — Z79899 Other long term (current) drug therapy: Secondary | ICD-10-CM | POA: Diagnosis not present

## 2016-02-09 DIAGNOSIS — D72829 Elevated white blood cell count, unspecified: Secondary | ICD-10-CM | POA: Diagnosis present

## 2016-02-09 DIAGNOSIS — D696 Thrombocytopenia, unspecified: Secondary | ICD-10-CM

## 2016-02-09 LAB — CBC WITH DIFFERENTIAL/PLATELET
BASO%: 0.6 % (ref 0.0–2.0)
BASOS ABS: 0.1 10*3/uL (ref 0.0–0.1)
EOS ABS: 0.2 10*3/uL (ref 0.0–0.5)
EOS%: 1.3 % (ref 0.0–7.0)
HCT: 51 % — ABNORMAL HIGH (ref 38.4–49.9)
HEMOGLOBIN: 16.3 g/dL (ref 13.0–17.1)
LYMPH#: 1.5 10*3/uL (ref 0.9–3.3)
LYMPH%: 10.2 % — ABNORMAL LOW (ref 14.0–49.0)
MCH: 25.3 pg — AB (ref 27.2–33.4)
MCHC: 32 g/dL (ref 32.0–36.0)
MCV: 79.2 fL — AB (ref 79.3–98.0)
MONO#: 0.7 10*3/uL (ref 0.1–0.9)
MONO%: 4.5 % (ref 0.0–14.0)
NEUT%: 83.4 % — ABNORMAL HIGH (ref 39.0–75.0)
NEUTROS ABS: 12.2 10*3/uL — AB (ref 1.5–6.5)
Platelets: <2 10*3/uL — CL (ref 145–400)
RBC: 6.43 10*6/uL — ABNORMAL HIGH (ref 4.20–5.82)
RDW: 14.8 % — AB (ref 11.0–14.6)
WBC: 14.6 10*3/uL — ABNORMAL HIGH (ref 4.0–10.3)

## 2016-02-09 MED ORDER — ACETAMINOPHEN 325 MG PO TABS: 650.0000 mg | ORAL_TABLET | ORAL | Status: DC | PRN

## 2016-02-09 MED ORDER — ACETAMINOPHEN 325 MG PO TABS
650.0000 mg | ORAL_TABLET | Freq: Once | ORAL | Status: AC
  Administered 2016-02-09: 650 mg via ORAL
  Filled 2016-02-09: qty 2

## 2016-02-09 MED ORDER — DEXAMETHASONE 4 MG PO TABS
40.0000 mg | ORAL_TABLET | Freq: Every day | ORAL | Status: DC
  Administered 2016-02-09 – 2016-02-11 (×3): 40 mg via ORAL
  Filled 2016-02-09 (×3): qty 10

## 2016-02-09 MED ORDER — SODIUM CHLORIDE 0.9 % IV SOLN: 250.0000 mL | INTRAVENOUS | Status: DC | PRN

## 2016-02-09 MED ORDER — HEPARIN SOD (PORK) LOCK FLUSH 100 UNIT/ML IV SOLN: 250.0000 [IU] | INTRAVENOUS | Status: DC | PRN

## 2016-02-09 MED ORDER — HEPARIN SOD (PORK) LOCK FLUSH 100 UNIT/ML IV SOLN: 500.0000 [IU] | Freq: Every day | INTRAVENOUS | Status: DC | PRN

## 2016-02-09 MED ORDER — SODIUM CHLORIDE 0.9% FLUSH: 3.0000 mL | INTRAVENOUS | Status: DC | PRN

## 2016-02-09 MED ORDER — DIPHENHYDRAMINE HCL 25 MG PO CAPS
25.0000 mg | ORAL_CAPSULE | Freq: Once | ORAL | Status: AC
  Administered 2016-02-09: 25 mg via ORAL
  Filled 2016-02-09: qty 1

## 2016-02-09 MED ORDER — DIPHENHYDRAMINE HCL 25 MG PO CAPS
ORAL_CAPSULE | ORAL | Status: AC
  Filled 2016-02-09: qty 1

## 2016-02-09 MED ORDER — SODIUM CHLORIDE 0.9 % IV SOLN
250.0000 mL | Freq: Once | INTRAVENOUS | Status: AC
  Administered 2016-02-09: 250 mL via INTRAVENOUS

## 2016-02-09 MED ORDER — OXYCODONE HCL 5 MG PO TABS: 5.0000 mg | ORAL_TABLET | ORAL | Status: DC | PRN

## 2016-02-09 MED ORDER — ONDANSETRON HCL 4 MG PO TABS: 4.0000 mg | ORAL_TABLET | Freq: Three times a day (TID) | ORAL | Status: DC | PRN

## 2016-02-09 MED ORDER — ONDANSETRON HCL 4 MG/2ML IJ SOLN: 4.0000 mg | Freq: Three times a day (TID) | INTRAMUSCULAR | Status: DC | PRN

## 2016-02-09 MED ORDER — ONDANSETRON 4 MG PO TBDP: 4.0000 mg | ORAL_TABLET | Freq: Three times a day (TID) | ORAL | Status: DC | PRN

## 2016-02-09 MED ORDER — SODIUM CHLORIDE 0.9 % IV SOLN
8.0000 mg | Freq: Three times a day (TID) | INTRAVENOUS | Status: DC | PRN
  Filled 2016-02-09: qty 4

## 2016-02-09 MED ORDER — SODIUM CHLORIDE 0.9% FLUSH
3.0000 mL | Freq: Two times a day (BID) | INTRAVENOUS | Status: DC
  Administered 2016-02-10: 3 mL via INTRAVENOUS

## 2016-02-09 MED ORDER — SENNOSIDES-DOCUSATE SODIUM 8.6-50 MG PO TABS: 1.0000 | ORAL_TABLET | Freq: Every evening | ORAL | Status: DC | PRN

## 2016-02-09 MED ORDER — ACETAMINOPHEN 325 MG PO TABS
ORAL_TABLET | ORAL | Status: AC
  Filled 2016-02-09: qty 2

## 2016-02-09 MED ORDER — SODIUM CHLORIDE 0.9% FLUSH: 10.0000 mL | INTRAVENOUS | Status: DC | PRN

## 2016-02-09 MED ORDER — IMMUNE GLOBULIN (HUMAN) 10 GM/100ML IV SOLN
1.0000 g/kg | INTRAVENOUS | Status: AC
  Administered 2016-02-09 – 2016-02-10 (×2): 70 g via INTRAVENOUS
  Filled 2016-02-09: qty 100
  Filled 2016-02-09: qty 600

## 2016-02-09 NOTE — H&P (Signed)
[facility] ADMISSION NOTE  Patient Care Team: No Pcp Per Patient as PCP - General (General Practice)  CHIEF COMPLAINTS/PURPOSE OF ADMISSION:  Recurrent ITP  HISTORY OF PRESENTING ILLNESS:  John Brennan 30 y.o. male is here because of recurrent ITP The patient was just admitted and discharged recently on 01/26/2016 for similar issue. With his prior admission, he received high-dose dexamethasone 40 mg daily 3 days along with 2 doses of IVIG at 1 g/kg 2 days. He also received platelet transfusion on 01/24/2016. At the cancer center last week on 02/04/2016, he received 1 unit of platelet transfusion He received Nplate and rituximab on 02/04/2016 His platelet count 2 days ago were over 200 He came in today as an add-on to my office because he has noticed gum bleeding this morning History was obtained via an interpreter. He denies hematuria, epistaxis, or hematochezia. He denies fever, chills, lymphadenopathy, or sick contact.  Summary of hematologic history He was recently hospitalized for similar reason of in April 2016, twice, and had received IVIG and corticosteroid therapy. Please see my initial consult note dated 03/26/2015 for further details. The patient was started on IVIG, dexamethasone, Solu-Medrol and prednisone. He is consider steroid refractory. On 04/23/2015, he is started on CellCept 250 mg twice a day From 04/30/2015 to 04/28/2015, he was readmitted to the hospital due to bleeding. Bone marrow aspirate and biopsy performed on 04/27/2015 confirmed diagnosis of ITP. He was started also in addition to all the above, Nplate injection weekly On 05/04/2015, he received his last platelet transfusion. Starting 05/07/2015, prednisone taper was initiated. On 05/31/2015, prednisone was discontinued and the patient was placed on CellCept along with Nplate on 07/14/2015, CellCept was reduced to 500 mg twice a day along with weekly Nplate injection On 09/10/2015, he  suffered from relapse due to recent infection. 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 He received 1 unit of platelet transfusion The patient was readmitted to the hospital from 12/26/2015 to 12/27/2015 with relapsed ITP. He responded to high-dose IVIG and steroids On 01/10/2016, he received first dose rituximab On 01/17/16: he had recurrent ITP and was hospitalized until January 25th, 2017. He received 1 unit of platelets on 01/17/2016 along worth 2 days of high-dose IVIG. CellCept was discontinued On 02/04/2016, he received 1 unit of platelet transfusion, Nplate and rituximab  MEDICAL HISTORY:  Past Medical History  Diagnosis Date  . Thrombocytopenia (HCC)   . Drug-induced skin rash 07/14/2015  . Fatty liver   . Acne 12/31/2015    SURGICAL HISTORY: No past surgical history on file.  SOCIAL HISTORY: Social History   Social History  . Marital Status: Single    Spouse Name: N/A  . Number of Children: N/A  . Years of Education: N/A   Occupational History  . Construction work    Social History Main Topics  . Smoking status: Never Smoker   . Smokeless tobacco: Never Used  . Alcohol Use: 1.8 oz/week    3 Cans of beer per week     Comment: occassional beer  . Drug Use: No  . Sexual Activity: Yes   Other Topics Concern  . Not on file   Social History Narrative    FAMILY HISTORY: Family History  Problem Relation Age of Onset  . Diabetes Father  ALLERGIES:  has No Known Allergies.  MEDICATIONS:  Current Facility-Administered Medications  Medication Dose Route Frequency Provider Last Rate Last Dose  . 0.9 %  sodium chloride infusion  250 mL Intravenous PRN Artis Delay, MD      . acetaminophen (TYLENOL) tablet 650 mg  650 mg Oral Q4H PRN  Artis Delay, MD      . dexamethasone (DECADRON) tablet 40 mg  40 mg Oral Daily Artis Delay, MD      . Immune Globulin 10% (OCTAGAM) IV infusion 70 g  1 g/kg Intravenous Q24H Spring San, MD      . ondansetron (ZOFRAN) tablet 4-8 mg  4-8 mg Oral Q8H PRN Artis Delay, MD       Or  . ondansetron (ZOFRAN-ODT) disintegrating tablet 4-8 mg  4-8 mg Oral Q8H PRN Artis Delay, MD       Or  . ondansetron (ZOFRAN) injection 4 mg  4 mg Intravenous Q8H PRN Artis Delay, MD       Or  . ondansetron (ZOFRAN) 8 mg in sodium chloride 0.9 % 50 mL IVPB  8 mg Intravenous Q8H PRN Ember Henrikson, MD      . oxyCODONE (Oxy IR/ROXICODONE) immediate release tablet 5 mg  5 mg Oral Q4H PRN Artis Delay, MD      . senna-docusate (Senokot-S) tablet 1 tablet  1 tablet Oral QHS PRN Artis Delay, MD      . sodium chloride flush (NS) 0.9 % injection 3 mL  3 mL Intravenous Q12H Javonni Macke, MD      . sodium chloride flush (NS) 0.9 % injection 3 mL  3 mL Intravenous PRN Artis Delay, MD       Facility-Administered Medications Ordered in Other Encounters  Medication Dose Route Frequency Provider Last Rate Last Dose  . dexamethasone (DECADRON) tablet 40 mg  40 mg Oral Daily Verdie Barrows, MD   40 mg at 02/04/16 1248  . heparin lock flush 100 unit/mL  500 Units Intracatheter Once PRN Artis Delay, MD      . romiPLOStim (NPLATE) injection 530 mcg  530 mcg Subcutaneous Weekly Artis Delay, MD   530 mcg at 02/04/16 1345  . sodium chloride 0.9 % injection 10 mL  10 mL Intracatheter PRN Artis Delay, MD        REVIEW OF SYSTEMS:   Constitutional: Denies fevers, chills or abnormal night sweats Eyes: Denies blurriness of vision, double vision or watery eyes Ears, nose, mouth, throat, and face: Denies mucositis or sore throat Respiratory: Denies cough, dyspnea or wheezes Cardiovascular: Denies palpitation, chest discomfort or lower extremity swelling Gastrointestinal:  Denies nausea, heartburn or change in bowel habits Skin: Denies abnormal skin  rashes Lymphatics: Denies new lymphadenopathy or easy bruising Neurological:Denies numbness, tingling or new weaknesses Behavioral/Psych: Mood is stable, no new changes  All other systems were reviewed with the patient and are negative.  PHYSICAL EXAMINATION: ECOG PERFORMANCE STATUS: 0 - Asymptomatic GENERAL:alert, no distress and comfortable SKIN: skin color, texture, turgor are normal, no rashes or significant lesions EYES: normal, conjunctiva are pink and non-injected, sclera clear OROPHARYNX:no exudate, no erythema and lips, buccal mucosa, and tongue normal  NECK: supple, thyroid normal size, non-tender, without nodularity LYMPH:  no palpable lymphadenopathy in the cervical, axillary or inguinal LUNGS: clear to auscultation and percussion with normal breathing effort HEART: regular rate & rhythm and no murmurs and no lower extremity edema ABDOMEN:abdomen soft, non-tender and normal bowel sounds Musculoskeletal:no cyanosis of digits and no clubbing  PSYCH: alert &  oriented x 3 with fluent speech NEURO: no focal motor/sensory deficits  LABORATORY DATA:  I have reviewed the data as listed Lab Results  Component Value Date   WBC 14.6* 02/09/2016   HGB 16.3 02/09/2016   HCT 51.0* 02/09/2016   MCV 79.2* 02/09/2016   PLT <2* 02/09/2016    Recent Labs  04/27/15 0400 04/28/15 0845  11/26/15 1355 12/24/15 1326 12/26/15 1253 01/12/16 1404  NA 129* 137  < > 142 140 140 140  K 3.8 3.3*  < > 4.1 4.0 3.5 3.8  CL 100* 103  --   --   --  104  --   CO2 25 24  < > GLUCOSE 145* 175*  < > 81 100 98 86  BUN 17 16  < > 17.8 11.9 18 13.4  CREATININE 0.75 0.76  < > 1.0 0.9 0.78 1.0  CALCIUM 8.8* 8.9  < > 9.9 9.7 9.0 9.6  GFRNONAA >60 >60  --   --   --  >60  --   GFRAA >60 >60  --   --   --  >60  --   PROT  --   --   < > 8.0 7.9  --  8.6*  ALBUMIN  --   --   < > 4.6 4.7  --  4.4  AST  --   --   < > 28 23  --  38*  ALT  --   --   < > 43 30  --  85*  ALKPHOS  --   --   < >  61 63  --  67  BILITOT  --   --   < > 0.64 0.57  --  0.30  < > = values in this interval not displayed.  ASSESSMENT & PLAN:  Recurrent, severe ITP It is dangerous for the patient to leave now with severe ITP. I recommend direct admission to the hospital to receive high-dose steroids, IVIG, and platelet transfusion as needed I have arrange for platelet transfusion I will give him pulse high-dose dexamethasone 40 mg daily starting today I will give him IVIG 1 g/kg daily 2 starting today We'll check his platelet daily  The patient is deemed refractory to steroids, CellCept, Nplate, IVIG and he is not able to complete rituximab due to persistent recurrent ITP I have a long discussion again with the patient in the presence of the interpreter Again, I will recommend consideration for splenectomy Other potential treatment options included azathioprine or cyclosporine but they would be at risk of causing major immunosuppression. Cyclosporine would have additional risks of causing kidney injury With the lack of insurance, I am not sure if I can refer him to tertiary center such as [facility], [place] or [facility] for second opinion Also, with the lack of insurance, the cost of Promacta would be prohibitive The patient will think about options as outlined above  CODE STATUS Full Code  DVT prophylaxis Lovenox is contraindicated with low platelet count I encourage increased ambulation  Discharge planning Hopefully he can be discharged home by Friday if his platelet count improves greater than 50,000  All questions were answered. The patient knows to call the clinic with any problems, questions or concerns.    [facility], Levern Pitter, MD 02/09/2016 12:46 PM

## 2016-02-09 NOTE — Telephone Encounter (Signed)
Per staff message and POF I have scheduled appts. Advised scheduler of appts and to move labs. JMW  

## 2016-02-09 NOTE — Telephone Encounter (Signed)
per pof to sch pt appt-pt to get updated copy on 2/20

## 2016-02-09 NOTE — Telephone Encounter (Signed)
Pt reports bleeding in his mouth.  He had notified Raynelle Fanning, [place] interpreter.  Dr. Bertis Ruddy notified and pt added on for lab appt today.

## 2016-02-10 ENCOUNTER — Encounter (HOSPITAL_COMMUNITY): Payer: Self-pay | Admitting: General Practice

## 2016-02-10 DIAGNOSIS — R945 Abnormal results of liver function studies: Secondary | ICD-10-CM

## 2016-02-10 DIAGNOSIS — D72828 Other elevated white blood cell count: Secondary | ICD-10-CM

## 2016-02-10 LAB — CBC WITH DIFFERENTIAL/PLATELET
Basophils Absolute: 0 10*3/uL (ref 0.0–0.1)
Basophils Relative: 0 %
Eosinophils Absolute: 0 10*3/uL (ref 0.0–0.7)
Eosinophils Relative: 0 %
HEMATOCRIT: 43 % (ref 39.0–52.0)
Hemoglobin: 14.1 g/dL (ref 13.0–17.0)
LYMPHS ABS: 1 10*3/uL (ref 0.7–4.0)
LYMPHS PCT: 6 %
MCH: 26.7 pg (ref 26.0–34.0)
MCHC: 32.8 g/dL (ref 30.0–36.0)
MCV: 81.3 fL (ref 78.0–100.0)
MONO ABS: 0.1 10*3/uL (ref 0.1–1.0)
MONOS PCT: 1 %
NEUTROS ABS: 15.6 10*3/uL — AB (ref 1.7–7.7)
Neutrophils Relative %: 93 %
Platelets: 20 10*3/uL — CL (ref 150–400)
RBC: 5.29 MIL/uL (ref 4.22–5.81)
RDW: 13.7 % (ref 11.5–15.5)
WBC: 16.8 10*3/uL — ABNORMAL HIGH (ref 4.0–10.5)

## 2016-02-10 LAB — COMPREHENSIVE METABOLIC PANEL
ALBUMIN: 3.6 g/dL (ref 3.5–5.0)
ALK PHOS: 60 U/L (ref 38–126)
ALT: 97 U/L — ABNORMAL HIGH (ref 17–63)
ANION GAP: 7 (ref 5–15)
AST: 51 U/L — AB (ref 15–41)
BILIRUBIN TOTAL: 0.8 mg/dL (ref 0.3–1.2)
BUN: 16 mg/dL (ref 6–20)
CALCIUM: 9.1 mg/dL (ref 8.9–10.3)
CO2: 24 mmol/L (ref 22–32)
Chloride: 101 mmol/L (ref 101–111)
Creatinine, Ser: 0.93 mg/dL (ref 0.61–1.24)
GFR calc Af Amer: >60 mL/min (ref >60)
GFR calc non Af Amer: >60 mL/min (ref >60)
GLUCOSE: 157 mg/dL — AB (ref 65–99)
Potassium: 4.4 mmol/L (ref 3.5–5.1)
SODIUM: 132 mmol/L — AB (ref 135–145)
TOTAL PROTEIN: 10.1 g/dL — AB (ref 6.5–8.1)

## 2016-02-10 LAB — PREPARE PLATELET PHERESIS: UNIT DIVISION: 0

## 2016-02-10 IMAGING — CT CT HEAD W/O CM
2 series · 16 of 30 positions shown, 19 images · non-contrast
Comparison: None.

CLINICAL DATA: Generalized headache. History of thrombocytopenia,
status post platelet transfusion 3 days ago.

EXAM:
CT HEAD WITHOUT CONTRAST
TECHNIQUE: Contiguous axial images were obtained from the base of the skull
through the vertex without intravenous contrast.

[Series 2: head w/o · axial · non-contrast · 0.45mm/px · z∈[-80,+30]mm · 9 of 28 slices shown, 12 images]
[im 3/28  brain]
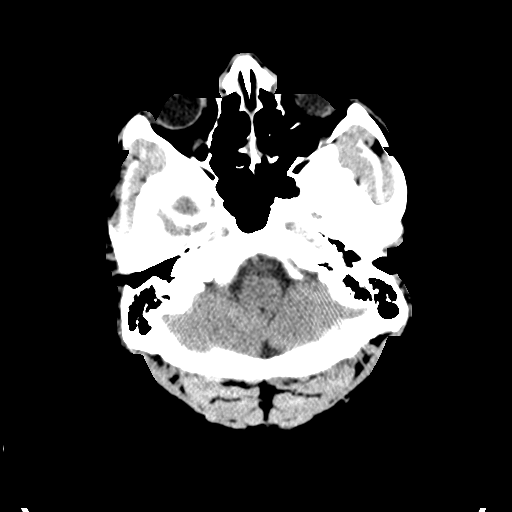
[im 3/28  bone]
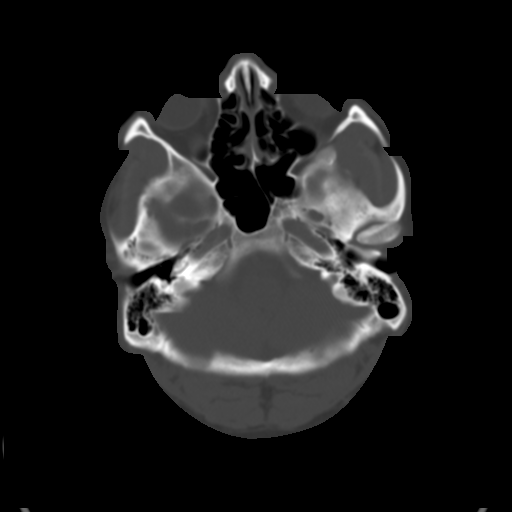
[im 6/28  brain]
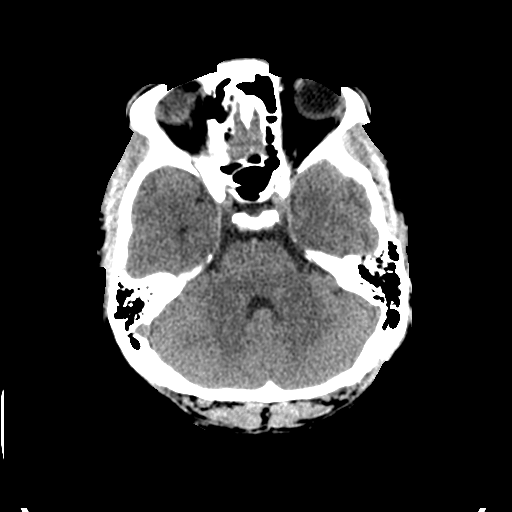
[im 9/28  brain]
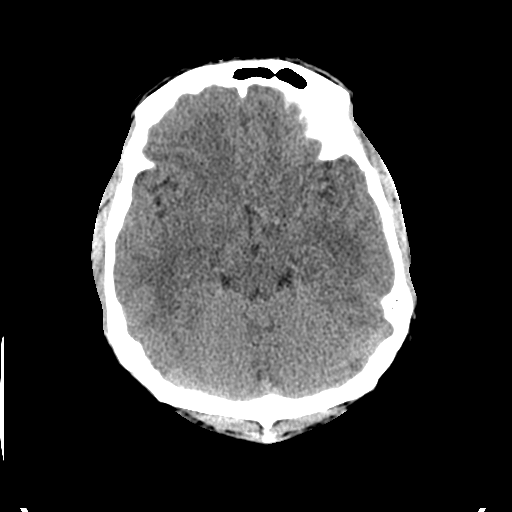
[im 11/28  brain]
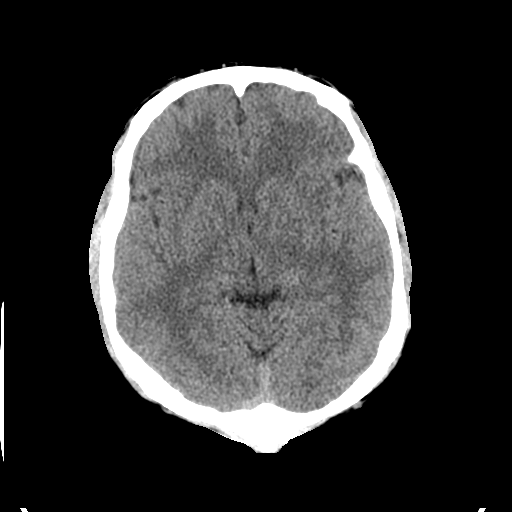
[im 14/28  brain]
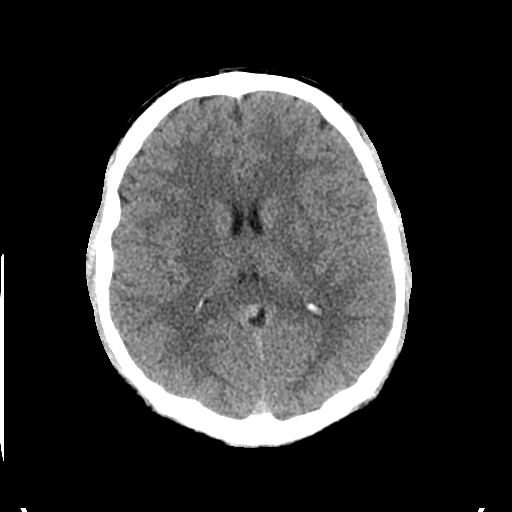
[im 14/28  bone]
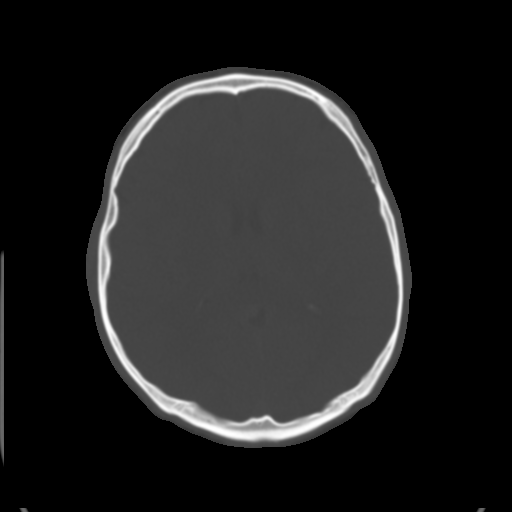
[im 17/28  brain]
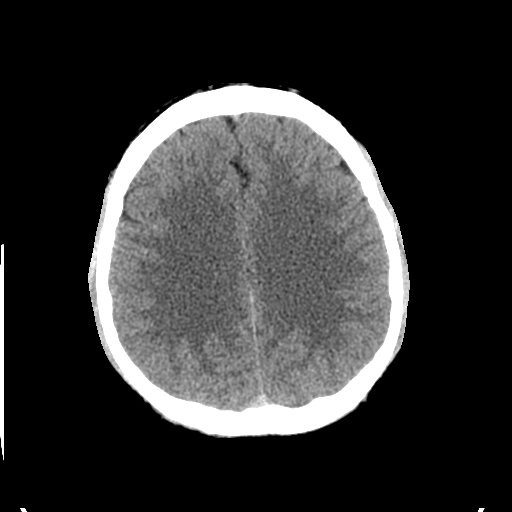
[im 19/28  brain]
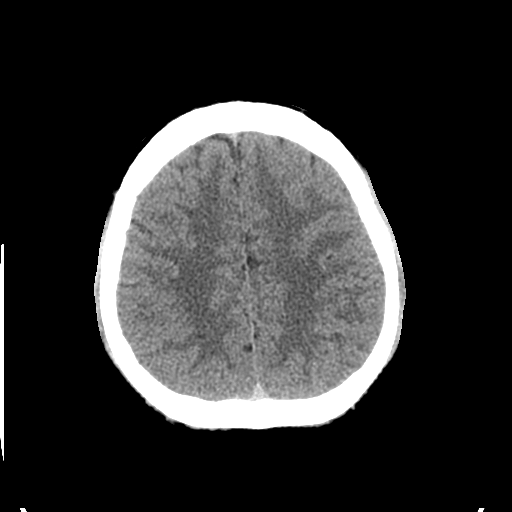
[im 22/28  brain]
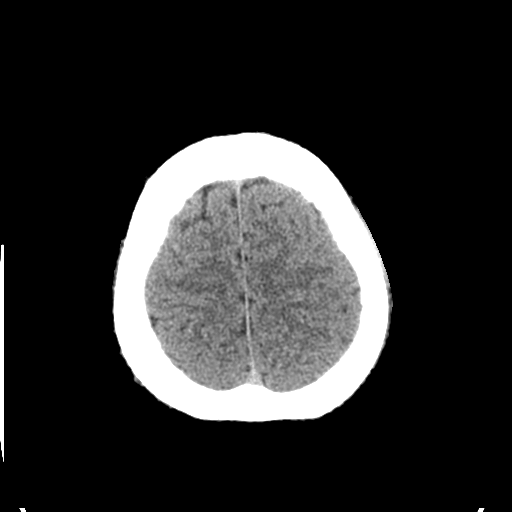
[im 25/28  brain]
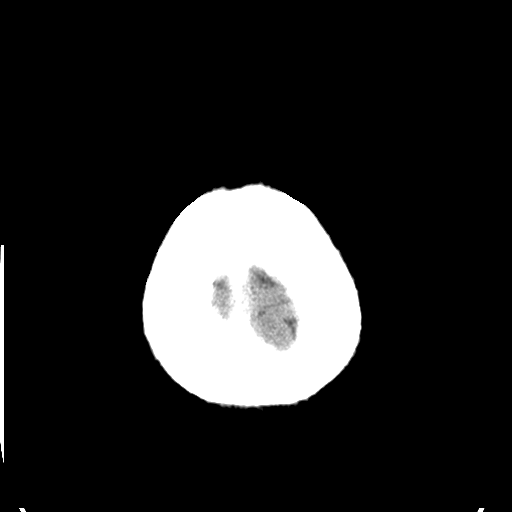
[im 25/28  bone]
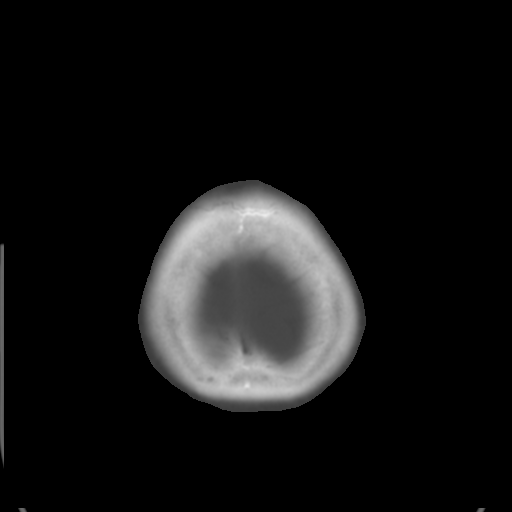

[Series 3: bone windows · axial · 0.45mm/px · z∈[-75,+15]mm · 7 of 46 slices shown]
[im 6/46  bone]
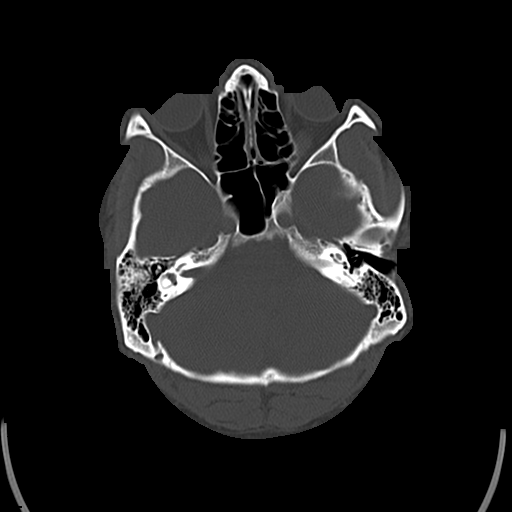
[im 11/46  bone]
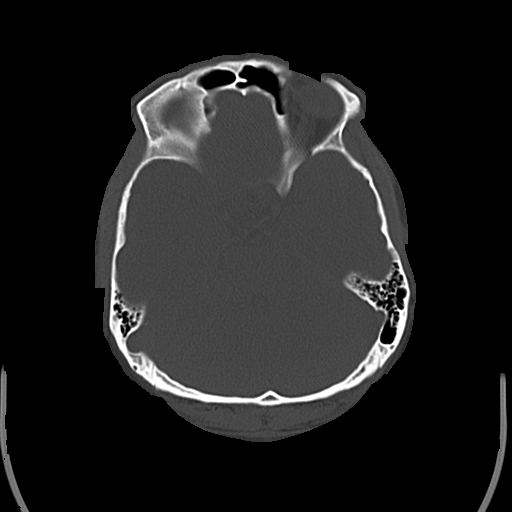
[im 16/46  bone]
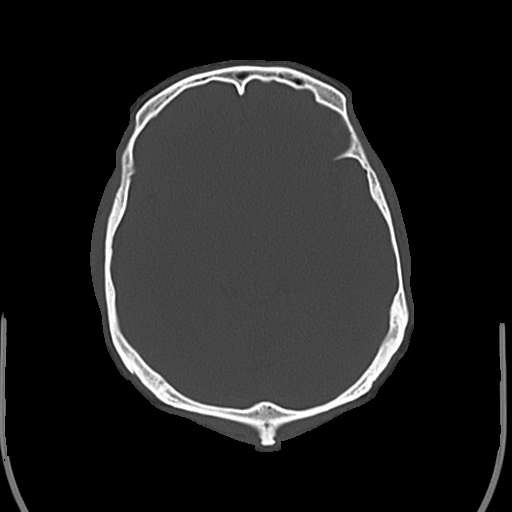
[im 21/46  bone]
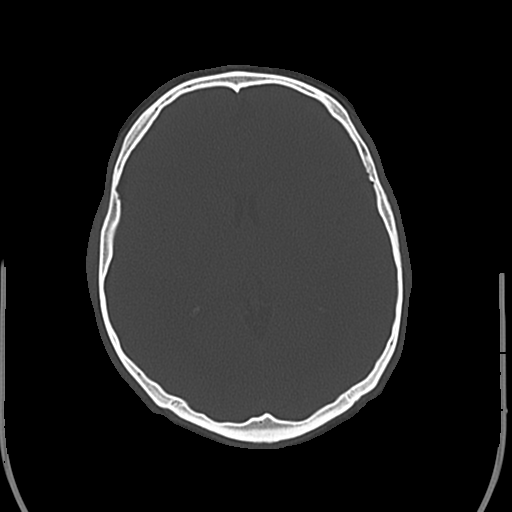
[im 26/46  bone]
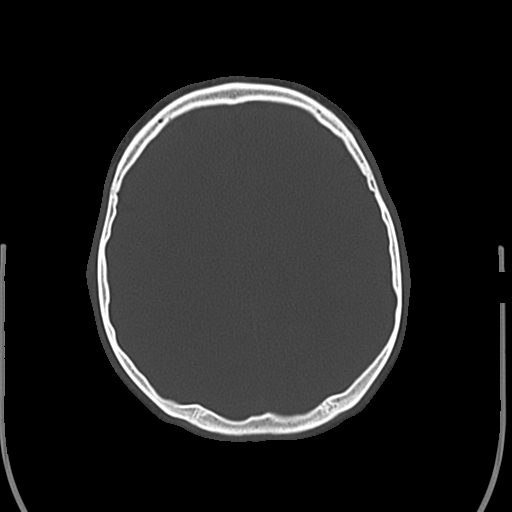
[im 31/46  bone]
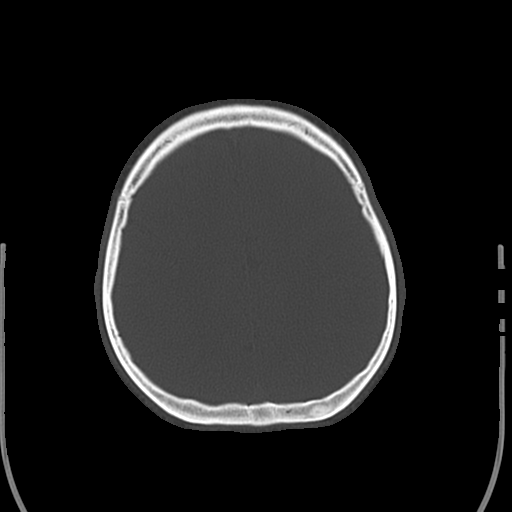
[im 36/46  bone]
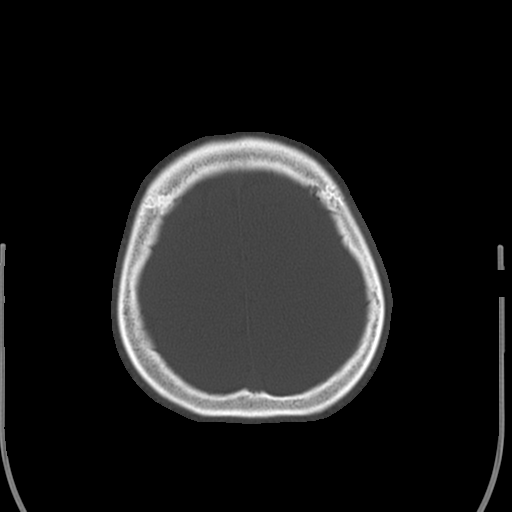

[16 of 30 positions shown; findings below may reference images not displayed]

FINDINGS: The ventricles and sulci are normal. No intraparenchymal hemorrhage,
mass effect nor midline shift. No acute large vascular territory
infarcts. Low-lying cerebellar tonsils at the foramen magnum.

No abnormal extra-axial fluid collections. Basal cisterns are
patent.

No skull fracture. The included ocular globes and orbital contents
are non-suspicious. The mastoid aircells and included paranasal
sinuses are well-aerated.
IMPRESSION: No acute intracranial process.

Low-lying cerebellar tonsils can be seen with Chiari 1 malformation,
and would be better characterized on MRI of the brain on a
nonemergent basis.

By: Lamiding Lindan

## 2016-02-10 MED ORDER — ROMIPLOSTIM 250 MCG ~~LOC~~ SOLR
9.0000 ug/kg | Freq: Once | SUBCUTANEOUS | Status: AC
  Administered 2016-02-10: 605 ug via SUBCUTANEOUS
  Filled 2016-02-10: qty 1

## 2016-02-10 NOTE — Progress Notes (Signed)
John Brennan   DOB:1986/03/15   YN#:[id number]    Subjective: Hospital day 2 for recurrent, refractory ITP He denies further bleeding  Objective:  Filed Vitals:   02/09/16 2213 02/10/16 0638  BP: 110/60 132/84  Pulse: 81 78  Temp: 97.7 F (36.5 C) 97.5 F (36.4 C)  Resp: 20 20     Intake/Output Summary (Last 24 hours) at 02/10/16 0810 Last data filed at 02/09/16 1600  Gross per 24 hour  Intake    260 ml  Output      0 ml  Net    260 ml    GENERAL:alert, no distress and comfortable SKIN: skin color, texture, turgor are normal, no rashes or significant lesions EYES: normal, Conjunctiva are pink and non-injected, sclera clear OROPHARYNX:no exudate, no erythema and lips, buccal mucosa, and tongue normal  NECK: supple, thyroid normal size, non-tender, without nodularity LYMPH:  no palpable lymphadenopathy in the cervical, axillary or inguinal LUNGS: clear to auscultation and percussion with normal breathing effort HEART: regular rate & rhythm and no murmurs and no lower extremity edema ABDOMEN:abdomen soft, non-tender and normal bowel sounds Musculoskeletal:no cyanosis of digits and no clubbing  NEURO: alert & oriented x 3 with fluent speech, no focal motor/sensory deficits   Labs:  Lab Results  Component Value Date   WBC 16.8* 02/10/2016   HGB 14.1 02/10/2016   HCT 43.0 02/10/2016   MCV 81.3 02/10/2016   PLT 20* 02/10/2016   NEUTROABS 15.6* 02/10/2016    Lab Results  Component Value Date   NA 132* 02/10/2016   K 4.4 02/10/2016   CL 101 02/10/2016   CO2 24 02/10/2016    Assessment & Plan:   Recurrent, severe ITP It is dangerous for the patient to leave now with severe ITP. I recommend direct admission to the hospital to receive high-dose steroids, IVIG, and platelet transfusion as needed I will give him pulse high-dose dexamethasone 40 mg daily  I will give him IVIG 1 g/kg daily 2 starting 02/09/16, today is day 2 We'll check his platelet daily  He will  receive Nplate tomorrow  Leukocytosis Related to dexamethasone Observe only  Elevated LFT This has been evaluated by GI and had CT scan which showed no evidence of liver disease Observe only  CODE STATUS Full Code  DVT prophylaxis Lovenox is contraindicated with low platelet count I encourage increased ambulation  Discharge planning Hopefully he can be discharged home by Friday if his platelet count improves greater than 50,000   John Tapp, MD 02/10/2016  8:10 AM

## 2016-02-10 NOTE — Progress Notes (Signed)
CRITICAL VALUE ALERT  Critical value received:  Platelet count of 20  Date of notification:  02/10/16  Time of notification: 0448  Critical value read back:Yes  Nurse who received alert: Kenton Kingfisher, RN  MD notified (1st page):  N/A (lab value actually higher than previous lab value; MD aware of low platelet count)  Time of first page: N/A  MD notified (2nd page):N/A  Time of second page:N/A  Responding MD:  N/A Time MD responded: N/A

## 2016-02-11 ENCOUNTER — Other Ambulatory Visit: Payer: Self-pay

## 2016-02-11 ENCOUNTER — Ambulatory Visit: Payer: Self-pay

## 2016-02-11 ENCOUNTER — Telehealth: Payer: Self-pay | Admitting: Hematology and Oncology

## 2016-02-11 LAB — CBC WITH DIFFERENTIAL/PLATELET
BASOS ABS: 0 10*3/uL (ref 0.0–0.1)
BASOS PCT: 0 %
Eosinophils Absolute: 0 10*3/uL (ref 0.0–0.7)
Eosinophils Relative: 0 %
HEMATOCRIT: 39.1 % (ref 39.0–52.0)
HEMOGLOBIN: 12.4 g/dL — AB (ref 13.0–17.0)
LYMPHS PCT: 4 %
Lymphs Abs: 0.8 10*3/uL (ref 0.7–4.0)
MCH: 25.4 pg — ABNORMAL LOW (ref 26.0–34.0)
MCHC: 31.7 g/dL (ref 30.0–36.0)
MCV: 80.1 fL (ref 78.0–100.0)
MONO ABS: 0.5 10*3/uL (ref 0.1–1.0)
Monocytes Relative: 2 %
NEUTROS ABS: 18.4 10*3/uL — AB (ref 1.7–7.7)
NEUTROS PCT: 93 %
Platelets: 197 10*3/uL (ref 150–400)
RBC: 4.88 MIL/uL (ref 4.22–5.81)
RDW: 13.6 % (ref 11.5–15.5)
WBC: 19.7 10*3/uL — ABNORMAL HIGH (ref 4.0–10.5)

## 2016-02-11 NOTE — Progress Notes (Signed)
Nursing Discharge Summary  Patient ID: John Brennan MRN: [id number] DOB/AGE: 20-Mar-1986 30 y.o.  Admit date: 02/09/2016 Discharge date: 02/11/2016  Discharged Condition: good  Disposition: 01-Home or Self Care    Prescriptions Given: No prescriptions given.  Patient follow up appointments discussed.  No medication to discuss.  Patient verbalized understanding of follow up appointments and Dr. Maxine Glenn instructions.  Andreas used as Manufacturing engineer of Discharge: Patient to ambulate downstairs to be discharged home via private vehicle   Signed: Gloriajean Dell 02/11/2016, 10:00 AM

## 2016-02-11 NOTE — Telephone Encounter (Signed)
[facility] for a second opinion. Due to no insurance the financial dept at [facility] will call pt this afternoon to discuss payment. Pt needs to be financially cleared before appt is made.

## 2016-02-11 NOTE — Discharge Summary (Signed)
Physician Discharge Summary  Patient ID: John Brennan MRN: [id number] DOB/AGE: 1986-07-03 30 y.o.  Admit date: 02/09/2016 Discharge date: 02/11/2016  Primary Care Physician:  No PCP Per Patient   Discharge Diagnoses:    Present on Admission:  . ITP (idiopathic thrombocytopenic purpura)  Discharge Medications:    Medication List    Notice    You have not been prescribed any medications.       Disposition and Follow-up:   Significant Diagnostic Studies:  No results found.  Discharge Laboratory Values: Lab Results  Component Value Date   WBC 19.7* 02/11/2016   HGB 12.4* 02/11/2016   HCT 39.1 02/11/2016   MCV 80.1 02/11/2016   PLT 197 02/11/2016   Lab Results  Component Value Date   NA 132* 02/10/2016   K 4.4 02/10/2016   CL 101 02/10/2016   CO2 24 02/10/2016    Brief H and P: For complete details please refer to admission H and P, but in brief, the patient was admitted for treatment of recurrent ITP  Physical Exam at Discharge: BP 129/69 mmHg  Pulse 74  Temp(Src) 97.7 F (36.5 C) (Oral)  Resp 18  Ht  (1.6 m)  Wt 148 lb (67.132 kg)  BMI 26.22 kg/m2  SpO2 99% GENERAL:alert, no distress and comfortable SKIN: skin color, texture, turgor are normal, no rashes or significant lesions EYES: normal, Conjunctiva are pink and non-injected, sclera clear Musculoskeletal:no cyanosis of digits and no clubbing  NEURO: alert & oriented x 3 with fluent speech, no focal motor/sensory deficits Hospital Course:  Active Problems:   ITP (idiopathic thrombocytopenic purpura)  Recurrent, severe ITP It is dangerous for the patient to leave now with severe ITP. I recommend direct admission to the hospital to receive high-dose steroids, IVIG, and platelet transfusion as needed I will give him pulse high-dose dexamethasone 40 mg daily along with IVIG 1 g/kg daily 2 starting 02/09/16 He also received Nplate on 02/10/16 At the time of discharge on 02/11/2016,  his platelet count has normalized to 197,000  Leukocytosis Related to dexamethasone Observe only  Elevated LFT This has been evaluated by GI and had CT scan which showed no evidence of liver disease Observe only  CODE STATUS Full Code Diet:    Activity:  As tolerated  Condition at Discharge:   stable  Signed: Dr. Artis Delay [phone]  02/11/2016, 8:31 AM

## 2016-02-14 ENCOUNTER — Other Ambulatory Visit: Payer: Self-pay

## 2016-02-18 ENCOUNTER — Other Ambulatory Visit: Payer: Self-pay | Admitting: *Deleted

## 2016-02-18 ENCOUNTER — Ambulatory Visit (HOSPITAL_COMMUNITY)
Admission: RE | Admit: 2016-02-18 | Discharge: 2016-02-18 | Disposition: A | Discharge status: Home / Self Care | Payer: Self-pay | Source: Ambulatory Visit | Attending: Hematology | Admitting: Hematology

## 2016-02-18 ENCOUNTER — Ambulatory Visit (HOSPITAL_BASED_OUTPATIENT_CLINIC_OR_DEPARTMENT_OTHER): Payer: Self-pay

## 2016-02-18 ENCOUNTER — Other Ambulatory Visit (HOSPITAL_BASED_OUTPATIENT_CLINIC_OR_DEPARTMENT_OTHER): Payer: Self-pay

## 2016-02-18 ENCOUNTER — Ambulatory Visit (HOSPITAL_COMMUNITY): Admission: RE | Admit: 2016-02-18 | Payer: Self-pay | Source: Ambulatory Visit

## 2016-02-18 VITALS — BP 126/92 | HR 85 | Temp 98.5°F

## 2016-02-18 VITALS — BP 128/70 | HR 76 | Temp 98.3°F | Resp 18

## 2016-02-18 DIAGNOSIS — D693 Immune thrombocytopenic purpura: Secondary | ICD-10-CM

## 2016-02-18 DIAGNOSIS — D696 Thrombocytopenia, unspecified: Secondary | ICD-10-CM

## 2016-02-18 LAB — CBC WITH DIFFERENTIAL/PLATELET
BASO%: 0.8 % (ref 0.0–2.0)
BASOS ABS: 0.1 10*3/uL (ref 0.0–0.1)
EOS ABS: 0.1 10*3/uL (ref 0.0–0.5)
EOS%: 1.3 % (ref 0.0–7.0)
HCT: 47.3 % (ref 38.4–49.9)
HEMOGLOBIN: 15.6 g/dL (ref 13.0–17.1)
LYMPH#: 1.8 10*3/uL (ref 0.9–3.3)
LYMPH%: 17.6 % (ref 14.0–49.0)
MCH: 26 pg — ABNORMAL LOW (ref 27.2–33.4)
MCHC: 32.9 g/dL (ref 32.0–36.0)
MCV: 79 fL — ABNORMAL LOW (ref 79.3–98.0)
MONO#: 0.7 10*3/uL (ref 0.1–0.9)
MONO%: 7 % (ref 0.0–14.0)
NEUT%: 73.3 % (ref 39.0–75.0)
NEUTROS ABS: 7.7 10*3/uL — AB (ref 1.5–6.5)
Platelets: 3 10*3/uL — CL (ref 140–400)
RBC: 6 10*6/uL — AB (ref 4.20–5.82)
RDW: 14.9 % — AB (ref 11.0–14.6)
WBC: 10.5 10*3/uL — AB (ref 4.0–10.3)

## 2016-02-18 LAB — COMPREHENSIVE METABOLIC PANEL
ALT: 141 U/L — ABNORMAL HIGH (ref 0–55)
ANION GAP: 7 meq/L (ref 3–11)
AST: 73 U/L — ABNORMAL HIGH (ref 5–34)
Albumin: 3.6 g/dL (ref 3.5–5.0)
Alkaline Phosphatase: 72 U/L (ref 40–150)
BUN: 12.1 mg/dL (ref 7.0–26.0)
CALCIUM: 9.1 mg/dL (ref 8.4–10.4)
CHLORIDE: 104 meq/L (ref 98–109)
CO2: 26 meq/L (ref 22–29)
Creatinine: 0.9 mg/dL (ref 0.7–1.3)
Glucose: 117 mg/dl (ref 70–140)
Potassium: 3.9 mEq/L (ref 3.5–5.1)
Sodium: 137 mEq/L (ref 136–145)
Total Bilirubin: 0.56 mg/dL (ref 0.20–1.20)
Total Protein: 9.2 g/dL — ABNORMAL HIGH (ref 6.4–8.3)

## 2016-02-18 MED ORDER — SODIUM CHLORIDE 0.9 % IV SOLN
250.0000 mL | Freq: Once | INTRAVENOUS | Status: AC
  Administered 2016-02-18: 250 mL via INTRAVENOUS

## 2016-02-18 MED ORDER — DIPHENHYDRAMINE HCL 25 MG PO CAPS
25.0000 mg | ORAL_CAPSULE | Freq: Once | ORAL | Status: AC
  Administered 2016-02-18: 25 mg via ORAL

## 2016-02-18 MED ORDER — ACETAMINOPHEN 325 MG PO TABS
ORAL_TABLET | ORAL | Status: AC
  Filled 2016-02-18: qty 2

## 2016-02-18 MED ORDER — ROMIPLOSTIM INJECTION 500 MCG
670.0000 ug | SUBCUTANEOUS | Status: DC
  Administered 2016-02-18: 670 ug via SUBCUTANEOUS
  Filled 2016-02-18: qty 1

## 2016-02-18 MED ORDER — DIPHENHYDRAMINE HCL 25 MG PO CAPS
ORAL_CAPSULE | ORAL | Status: AC
Start: 2016-02-18 — End: 2016-02-18
  Filled 2016-02-18: qty 1

## 2016-02-18 MED ORDER — ACETAMINOPHEN 325 MG PO TABS
650.0000 mg | ORAL_TABLET | Freq: Once | ORAL | Status: AC
  Administered 2016-02-18: 650 mg via ORAL

## 2016-02-18 NOTE — Progress Notes (Signed)
John Brennan states that he isn't having any bleeding.

## 2016-02-18 NOTE — Patient Instructions (Signed)
Informacin sobre la transfusin de plaquetas (Platelet Transfusion Information) Esta es informacin acerca de la transfusin de plaquetas. Las plaquetas son pequeas clulas producidas en la mdula sea y que se encuentran en la sangre. Cuando un vaso sanguneo se daa, las plaquetas fluyen hacia la zona lesionada para formar un cogulo. Este procedimiento hace comenzar el proceso de curacin. Cuando el nivel de plaquetas es muy bajo en su sangre, podr tener problemas de coagulacin. Esto puede deberse a:  Enfermedades  Trastornos en la sangre.  Quimioterapia para tratar el cncer. A menudo, muchas veces los niveles bajos de plaquetas no causan problemas.  Las plaquetas pueden durar entre 7 y 10 das. Si no se utilizan en una lesin el bazo o el hgado las rompen. Los sntomas de un bajo recuento de plaquetas incluyen:  Hemorragias nasales.  Encas que sangran.  Abundante sangrado menstrual.  Hematomas y pequeos puntos rojos en la piel.  Los pequeos puntos de la piel se denominan petequias.  Grandes hematomas(prpura).  La hemorragia puede ser ms seria si sucede en el cerebro o el intestino. Las transfusiones de plaquetas a menudo se utilizan para mantener un nivel aceptable. Es poco comn que se produzca una hemorragia grave debido a la baja cantidad de plaquetas. RIESGOS Y COMPLICACIONES  No son frecuentes los efectos secundarios graves de este tratamiento. Las reacciones menores pueden incluir:  Picazn  Erupciones  Temperaturas alta y escalofros. Existen medicamentos disponibles para detener las reacciones a las transfusiones. Comente con el profesional que lo asiste si tiene alguno de estos problemas.  Si recibe transfusiones de plaquetas de manera frecuente pueden ser menos efectivas. Esto se denomina ser refractario a la plaquetas. Esto no es frecuente. Puede suceder por causas inmunes y no inmunes. Las causas no inmunes incluyen:  Fiebre alta.  Algunos  medicamentos.  Bazo agrandado. Las causas inmunes se producen cuando su organismo detecta que las plaquetas no le son propias y fabrican anticuerpos en contra de ellas. Los anticuerpos destruyen las plaquetas rpidamente. An con la transfusin de plaquetas usted podra continuar con problemas de sangrado o hematomas. Comunquele a sus mdicos si esto le sucede. Hay otras medidas para tomar si esto le sucede. ANTES DEL PROCEDIMIENTO  El mdico controlar su nivel de plaquetas de forma regular.  Si el nivel es muy bajo ser necesario realizarle una transfusin.  Esto es ms importante antes de ciertos procedimiento con riesgo de hemorragia como una puncin espinal.  La transfusin de plaquetas reduce el riesgo de hemorragias durante o despus del procedimiento.  Excepto durante emergencias, la transfusin requiere un consentimiento escrito. Antes de extraer sangre de un donante, se tomar un historial completo para asegurarse que la persona no tiene antecedentes de enfermedades previas y no est presenta conductas sociales de riesgo. Son ejemplo de estas ltimas el consumo de drogas por va intravenosa o la actividad sexual con mltiples parejas. Esto podra llevar a que fueran utilizados tanto sangre como sus derivados estando infectados. Esto se realiza incluso adems del exhaustivo control para asegurarse de que la sangre es segura. Todos los productos sanguneos que se transfunden se estudian para asegurarse de que son compatibles con la persona que la recibe. Tambin se controla para descartar la presencia de infecciones. Actualmente la sangre de transfusin es ms segura que nunca antes. El riesgo de infeccin es muy bajo. PROCEDIMIENTO  Las plaquetas se almacenan en pequeas bolsas plsticas que se guardan a bajas temperaturas.  Cada bolsa se denomina unidad y a veces se administran dos. Se   administran a travs de una gua intravenosa por goteo durante aproximadamente media hora.  Por lo  general la sangre se obtiene de muchas personas para tener la cantidad suficiente para transfundir.  A veces, las plaquetas se obtienen de una sola persona. Esto se realiza mediante una mquina especial que separa las plaquetas de la sangre. Esta se denomina mquina de afresis. Las plaquetas obtenidas por esta va se denominan plaquetas por afresis. Las plaquetas por afresis reducen el riesgo de volverse sensible a las plaquetas. Esto disminuye las posibilidades de tener una reaccin a la transfusin.  Como lleva muy poco tiempo donar plaquetas, este tratamiento puede administrarse en un consultorio externo. Las plaquetas tambin pueden administrarse antes o despus de otros tratamientos. SOLICITE ATENCIN MDICA INMEDIATAMENTE SI: Aparecen algunos de los siguientes sntomas en las 12 horas o en los das siguientes:  Siente escalofros.  Sube la fiebre, con una temperatura superior a los 102 F (38.9 C)  Dolores de espalda o musculares  Las personas que lo rodean creen que usted no acta correctamente o lo ven confundido  Sangre al orinar o en la deposicin o hemorragia en cualquier parte del cuerpo.  Le falta la respiracin o presenta dificultades respiratorias.  Mareos.  Desmayos  Desarrolla un sarpullido o urticaria.  Disminuye la cantidad de orina, o sta toma un color oscuro o cambia hacia un tono rosado, rojo o marrn.  Dolor de cabeza intenso o rigidez en el cuello.  Se le forman hematomas fcilmente. Document Released: 03/29/2009 Document Revised: 03/04/2012 [employer] Patient Information 2015 [employer]. This information is not intended to replace advice given to you by your health care provider. Make sure you discuss any questions you have with your health care provider.   

## 2016-02-18 NOTE — Progress Notes (Signed)
Platelet count 3K, 1 unit of platelets, N-plate ordered. Will not receive rituxan today.

## 2016-02-19 ENCOUNTER — Inpatient Hospital Stay (HOSPITAL_COMMUNITY)
Admission: EM | Admit: 2016-02-19 | Discharge: 2016-02-21 | DRG: 813 | Disposition: A | Discharge status: Home / Self Care | Payer: Medicaid Other | Attending: Internal Medicine | Admitting: Internal Medicine

## 2016-02-19 ENCOUNTER — Encounter (HOSPITAL_COMMUNITY): Payer: Self-pay | Admitting: Emergency Medicine

## 2016-02-19 DIAGNOSIS — Z833 Family history of diabetes mellitus: Secondary | ICD-10-CM

## 2016-02-19 DIAGNOSIS — R58 Hemorrhage, not elsewhere classified: Secondary | ICD-10-CM | POA: Diagnosis present

## 2016-02-19 DIAGNOSIS — R748 Abnormal levels of other serum enzymes: Secondary | ICD-10-CM | POA: Diagnosis present

## 2016-02-19 DIAGNOSIS — K219 Gastro-esophageal reflux disease without esophagitis: Secondary | ICD-10-CM | POA: Diagnosis present

## 2016-02-19 DIAGNOSIS — D696 Thrombocytopenia, unspecified: Secondary | ICD-10-CM | POA: Diagnosis present

## 2016-02-19 DIAGNOSIS — D72829 Elevated white blood cell count, unspecified: Secondary | ICD-10-CM | POA: Diagnosis present

## 2016-02-19 DIAGNOSIS — R945 Abnormal results of liver function studies: Secondary | ICD-10-CM | POA: Diagnosis present

## 2016-02-19 DIAGNOSIS — R7989 Other specified abnormal findings of blood chemistry: Secondary | ICD-10-CM | POA: Diagnosis present

## 2016-02-19 DIAGNOSIS — D693 Immune thrombocytopenic purpura: Principal | ICD-10-CM | POA: Diagnosis present

## 2016-02-19 LAB — CBC WITH DIFFERENTIAL/PLATELET
Basophils Absolute: 0 10*3/uL (ref 0.0–0.1)
Basophils Relative: 0 %
EOS PCT: 1 %
Eosinophils Absolute: 0.1 10*3/uL (ref 0.0–0.7)
HEMATOCRIT: 45.5 % (ref 39.0–52.0)
HEMOGLOBIN: 15.4 g/dL (ref 13.0–17.0)
Lymphocytes Relative: 16 %
Lymphs Abs: 1.5 10*3/uL (ref 0.7–4.0)
MCH: 27.6 pg (ref 26.0–34.0)
MCHC: 33.8 g/dL (ref 30.0–36.0)
MCV: 81.7 fL (ref 78.0–100.0)
MONOS PCT: 7 %
Monocytes Absolute: 0.6 10*3/uL (ref 0.1–1.0)
NEUTROS PCT: 76 %
Neutro Abs: 7 10*3/uL (ref 1.7–7.7)
Platelets: <5 10*3/uL — CL (ref 150–400)
RBC: 5.57 MIL/uL (ref 4.22–5.81)
RDW: 13.7 % (ref 11.5–15.5)
WBC: 9.2 10*3/uL (ref 4.0–10.5)

## 2016-02-19 LAB — COMPREHENSIVE METABOLIC PANEL
ALK PHOS: 70 U/L (ref 38–126)
ALT: 125 U/L — AB (ref 17–63)
AST: 65 U/L — AB (ref 15–41)
Albumin: 4.2 g/dL (ref 3.5–5.0)
Anion gap: 8 (ref 5–15)
BILIRUBIN TOTAL: 0.7 mg/dL (ref 0.3–1.2)
BUN: 12 mg/dL (ref 6–20)
CALCIUM: 9.8 mg/dL (ref 8.9–10.3)
CO2: 29 mmol/L (ref 22–32)
CREATININE: 0.86 mg/dL (ref 0.61–1.24)
Chloride: 104 mmol/L (ref 101–111)
GFR calc Af Amer: >60 mL/min (ref >60)
GFR calc non Af Amer: >60 mL/min (ref >60)
GLUCOSE: 88 mg/dL (ref 65–99)
Potassium: 4.9 mmol/L (ref 3.5–5.1)
Sodium: 141 mmol/L (ref 135–145)
TOTAL PROTEIN: 9.3 g/dL — AB (ref 6.5–8.1)

## 2016-02-19 LAB — TYPE AND SCREEN
ABO/RH(D): O POS
Antibody Screen: NEGATIVE

## 2016-02-19 MED ORDER — SODIUM CHLORIDE 0.9% FLUSH
3.0000 mL | Freq: Two times a day (BID) | INTRAVENOUS | Status: DC
  Administered 2016-02-19 – 2016-02-20 (×2): 3 mL via INTRAVENOUS
  Administered 2016-02-21: 10 mL via INTRAVENOUS

## 2016-02-19 MED ORDER — SODIUM CHLORIDE 0.9 % IV BOLUS (SEPSIS)
500.0000 mL | Freq: Once | INTRAVENOUS | Status: AC
  Administered 2016-02-19: 500 mL via INTRAVENOUS

## 2016-02-19 MED ORDER — DEXAMETHASONE SODIUM PHOSPHATE 10 MG/ML IJ SOLN
30.0000 mg | Freq: Once | INTRAMUSCULAR | Status: AC
  Administered 2016-02-19: 30 mg via INTRAVENOUS
  Filled 2016-02-19: qty 3

## 2016-02-19 MED ORDER — DEXAMETHASONE SODIUM PHOSPHATE 10 MG/ML IJ SOLN
40.0000 mg | INTRAMUSCULAR | Status: DC
  Administered 2016-02-20 – 2016-02-21 (×2): 40 mg via INTRAVENOUS
  Filled 2016-02-19 (×2): qty 4

## 2016-02-19 MED ORDER — IMMUNE GLOBULIN (HUMAN) 10 GM/100ML IV SOLN
1.0000 g/kg | INTRAVENOUS | Status: AC
  Administered 2016-02-19: 65 g via INTRAVENOUS
  Filled 2016-02-19: qty 650

## 2016-02-19 MED ORDER — ONDANSETRON HCL 4 MG/2ML IJ SOLN: 4.0000 mg | Freq: Four times a day (QID) | INTRAMUSCULAR | Status: DC | PRN

## 2016-02-19 MED ORDER — ACETAMINOPHEN 650 MG RE SUPP: 650.0000 mg | Freq: Four times a day (QID) | RECTAL | Status: DC | PRN

## 2016-02-19 MED ORDER — SODIUM CHLORIDE 0.9 % IV SOLN
250.0000 mL | INTRAVENOUS | Status: DC | PRN
  Administered 2016-02-19: 250 mL via INTRAVENOUS

## 2016-02-19 MED ORDER — SODIUM CHLORIDE 0.9% FLUSH: 3.0000 mL | INTRAVENOUS | Status: DC | PRN

## 2016-02-19 MED ORDER — ONDANSETRON HCL 4 MG PO TABS: 4.0000 mg | ORAL_TABLET | Freq: Four times a day (QID) | ORAL | Status: DC | PRN

## 2016-02-19 MED ORDER — SODIUM CHLORIDE 0.9 % IV SOLN
Freq: Once | INTRAVENOUS | Status: AC
  Administered 2016-02-19: 14:00:00 via INTRAVENOUS

## 2016-02-19 MED ORDER — ACETAMINOPHEN 325 MG PO TABS: 650.0000 mg | ORAL_TABLET | Freq: Four times a day (QID) | ORAL | Status: DC | PRN

## 2016-02-19 MED ORDER — DEXAMETHASONE SODIUM PHOSPHATE 10 MG/ML IJ SOLN
10.0000 mg | Freq: Once | INTRAMUSCULAR | Status: AC
  Administered 2016-02-19: 10 mg via INTRAVENOUS
  Filled 2016-02-19: qty 1

## 2016-02-19 MED ORDER — DIPHENHYDRAMINE HCL 50 MG/ML IJ SOLN
25.0000 mg | Freq: Once | INTRAMUSCULAR | Status: AC | PRN
  Administered 2016-02-19: 25 mg via INTRAVENOUS
  Filled 2016-02-19: qty 1

## 2016-02-19 MED ORDER — FAMOTIDINE IN NACL 20-0.9 MG/50ML-% IV SOLN
20.0000 mg | Freq: Once | INTRAVENOUS | Status: AC
  Administered 2016-02-19: 20 mg via INTRAVENOUS
  Filled 2016-02-19: qty 50

## 2016-02-19 NOTE — ED Notes (Signed)
Pt reports his gums are bleeding and he feels his platelets are low. Had platelet transfusion at cancer center yesterday, but feels the same.

## 2016-02-19 NOTE — ED Notes (Signed)
Floor nurse called and stated that pt can go to room 1339 and the bed is ready

## 2016-02-19 NOTE — ED Notes (Signed)
Floor nurse not yet determined on [address].  Charge RN is adjusting the schedule and the receiving RN will call back.

## 2016-02-19 NOTE — ED Notes (Signed)
Admitting MD at bedside.

## 2016-02-19 NOTE — H&P (Addendum)
[facility] History and Physical  Alexsandro Salek FSF:[id number] DOB: 12/23/1986 DOA: 02/19/2016   PCP: No PCP Per Patient  Specialists: Followed by Dr. Alvy Bimler  Chief Complaint: Bleeding in the mouth  HPI: John Brennan is a 30 y.o. male with a past medical history of refractory ITP who was recently hospitalized and discharged on Feb 17th after being given IVIG, high-dose steroids and Nplate. Patient's platelet count responded appropriately at that time. He was seen at the hematology clinic and given another dose of nplate on February 24. Overnight patient developed red spots in his mouth and he started having some bleeding. So he decided to come into the hospital for further evaluation. Patient speaks Spanish and very limited [place]. His cousin was at the bedside, who interpreted. Patient denies any other pain. No other bleeding from any other site. Mouth is not actively bleeding currently. He denies any headaches, shortness of breath. No nausea, vomiting. No falls or injuries recently. No joint pains.  In the emergency department, his platelet counts are less than 5. He'll be hospitalized for further management.  Home Medications: Prior to Admission medications   Not on File    Allergies: No Known Allergies  Past Medical History: Past Medical History  Diagnosis Date  . Thrombocytopenia (Dep[place])   . Drug-induced skin rash 07/14/2015  . Fatty liver   . Acne 12/31/2015    History reviewed. No pertinent past surgical history.  Social History: He lives in [place]. No history of smoking, alcohol use or illicit drug use currently. He used to drink alcohol  about a year ago.   Family History:  Family History  Problem Relation Age of Onset  . Diabetes Father      Review of Systems - Negative except As noted in history of present illness  Physical Examination  Filed Vitals:   02/19/16 1433 02/19/16 1545 02/19/16 1551 02/19/16 1657  BP: 130/73 136/80 132/93 134/86    Pulse: 68  75 77  Temp: 99 F (37.2 C) 98 F (36.7 C) 98.5 F (36.9 C) 98 F (36.7 C)  TempSrc: Oral Oral Oral Oral  Resp: '16 17 17 16  ' SpO2: 99% 98% 100% 100%    BP 134/86 mmHg  Pulse 77  Temp(Src) 98 F (36.7 C) (Oral)  Resp 16  SpO2 100%  General appearance: alert, cooperative, appears stated age and no distress Head: Normocephalic, without obvious abnormality, atraumatic Eyes: conjunctivae/corneas clear. PERRL, EOM's intact. Throat: Few petechia noted inside the mouth. Few small hematomas appreciated. No active bleeding from the gums is noted. Neck: no adenopathy, no carotid bruit, no JVD, supple, symmetrical, trachea midline and thyroid not enlarged, symmetric, no tenderness/mass/nodules Resp: clear to auscultation bilaterally Cardio: regular rate and rhythm, S1, S2 normal, no murmur, click, rub or gallop GI: soft, non-tender; bowel sounds normal; no masses,  no organomegaly Extremities: extremities normal, atraumatic, no cyanosis or edema Pulses: 2+ and symmetric Skin: Skin color, texture, turgor normal. No rashes or lesions Lymph nodes: Cervical, supraclavicular, and axillary nodes normal. Neurologic: No focal deficits  Laboratory Data: Results for orders placed or performed during the hospital encounter of 02/19/16 (from the past 48 hour(s))  CBC with Differential     Status: Abnormal   Collection Time: 02/19/16 12:00 PM  Result Value Ref Range   WBC 9.2 4.0 - 10.5 K/uL   RBC 5.57 4.22 - 5.81 MIL/uL   Hemoglobin 15.4 13.0 - 17.0 g/dL   HCT 45.5 39.0 - 52.0 %   MCV 81.7 78.0 -  100.0 fL   MCH 27.6 26.0 - 34.0 pg   MCHC 33.8 30.0 - 36.0 g/dL   RDW 13.7 11.5 - 15.5 %   Platelets <5 (LL) 150 - 400 K/uL    Comment: RESULT REPEATED AND VERIFIED SPECIMEN CHECKED FOR CLOTS PLATELET COUNT CONFIRMED BY SMEAR CRITICAL RESULT CALLED TO, READ BACK BY AND VERIFIED WITH: KINNEY,D AT 1250 ON [id number] BY HOOKER,B    Neutrophils Relative % 76 %   Lymphocytes Relative 16 %    Monocytes Relative 7 %   Eosinophils Relative 1 %   Basophils Relative 0 %   Neutro Abs 7.0 1.7 - 7.7 K/uL   Lymphs Abs 1.5 0.7 - 4.0 K/uL   Monocytes Absolute 0.6 0.1 - 1.0 K/uL   Eosinophils Absolute 0.1 0.0 - 0.7 K/uL   Basophils Absolute 0.0 0.0 - 0.1 K/uL   Smear Review MORPHOLOGY UNREMARKABLE   Comprehensive metabolic panel     Status: Abnormal   Collection Time: 02/19/16 12:00 PM  Result Value Ref Range   Sodium 141 135 - 145 mmol/L   Potassium 4.9 3.5 - 5.1 mmol/L   Chloride 104 101 - 111 mmol/L   CO2 29 22 - 32 mmol/L   Glucose, Bld 88 65 - 99 mg/dL   BUN 12 6 - 20 mg/dL   Creatinine, Ser 0.86 0.61 - 1.24 mg/dL   Calcium 9.8 8.9 - 10.3 mg/dL   Total Protein 9.3 (H) 6.5 - 8.1 g/dL   Albumin 4.2 3.5 - 5.0 g/dL   AST 65 (H) 15 - 41 U/L   ALT 125 (H) 17 - 63 U/L   Alkaline Phosphatase 70 38 - 126 U/L   Total Bilirubin 0.7 0.3 - 1.2 mg/dL   GFR calc non Af Amer >60 >60 mL/min   GFR calc Af Amer >60 >60 mL/min    Comment: (NOTE) The eGFR has been calculated using the CKD EPI equation. This calculation has not been validated in all clinical situations. eGFR's persistently <60 mL/min signify possible Chronic Kidney Disease.    Anion gap 8 5 - 15  Type and screen Lucien     Status: None   Collection Time: 02/19/16 12:00 PM  Result Value Ref Range   ABO/RH(D) O POS    Antibody Screen NEG    Sample Expiration 02/22/2016   Prepare Pheresed Platelets     Status: None (Preliminary result)   Collection Time: 02/19/16  1:18 PM  Result Value Ref Range   Unit Number [id number]    Blood Component Type PLTPHER LRI1    Unit division 00    Status of Unit ISSUED    Transfusion Status OK TO TRANSFUSE    Unit Number [id number]    Blood Component Type PLTPHER LRI1    Unit division 00    Status of Unit ISSUED    Transfusion Status OK TO TRANSFUSE     Radiology Reports: No results found.   Problem List  Active Problems:   ITP (idiopathic  thrombocytopenic purpura)   Assessment: This is a 30 year old Hispanic male with refractory ITP who has been hospitalized many times over the past one year for similar problems. He comes in with gum bleeding and is found to have severe thrombocytopenia.  Plan: #1 Refractory ITP: Patient will be transfused platelets. Discussed with the on-call hematologist, Dr. Irene Limbo. He recommends high-dose dexamethasone and IVIG. These have been ordered. He is not actively bleeding currently. Patient has been given Rituxan. He is also being  given CellCept in the past. He has not undergone splenectomy. If he develops gum bleeding, we can utilize Amicar.  #2 Abnormal LFTs: He has been seen by gastroenterology. Hepatitis panel has been negative previously. Etiology for his abnormal LFTs is not clear. Counts appear to be stable.  DVT Prophylaxis: SCDs Code Status: Full code Family Communication: Discussed with the patient via his cousin  Disposition Plan: Observe to MedSurg   Further management decisions will depend on results of further testing and patient's response to treatment.   [facility]  [facility] Pager [phone]  If 7PM-7AM, please contact night-coverage [email or web address] Password [facility]  02/19/2016, 5:03 PM

## 2016-02-19 NOTE — ED Notes (Signed)
Reported to receiving RN on 3 west.  Room 1340 is the room the patient will go to and it won't be ready for another 20 minutes or so as it is being cleaned.

## 2016-02-19 NOTE — Progress Notes (Signed)
Patient arrived to unit at around 1530 with first bag of platelet finishing.Patient is alert and oriented x 3, speaks  Spanish, family at bedside. Denies pain. Ambulatory. Will continue to monitor.

## 2016-02-19 NOTE — ED Provider Notes (Addendum)
CSN: [id number]     Arrival date & time 02/19/16  1021 History   First MD Initiated Contact with Patient 02/19/16 1121     Chief Complaint  Patient presents with  . platelets feel low      (Consider location/radiation/quality/duration/timing/severity/associated sxs/prior Treatment) HPI..... Level V caveat for language barrier. Patient has known history of ITP and was recently admitted on February 15 for exacerbation of same. Today he noticed ecchymosis on his right antecubital area and bleeding gums.  No fever, sweats, chills. Severity of symptoms moderate.  Past Medical History  Diagnosis Date  . Thrombocytopenia (HCC)   . Drug-induced skin rash 07/14/2015  . Fatty liver   . Acne 12/31/2015   History reviewed. No pertinent past surgical history. Family History  Problem Relation Age of Onset  . Diabetes Father    Social History  Substance Use Topics  . Smoking status: Never Smoker   . Smokeless tobacco: Never Used  . Alcohol Use: 1.8 oz/week    3 Cans of beer per week     Comment: occassional beer    Review of Systems  Reason unable to perform ROS: Language barrier.      Allergies  Review of patient's allergies indicates no known allergies.  Home Medications   Prior to Admission medications   Not on File   BP 127/80 mmHg  Pulse 66  Temp(Src) 98.3 F (36.8 C) (Oral)  Resp 16  SpO2 100% Physical Exam  Constitutional: He is oriented to person, place, and time. He appears well-developed and well-nourished.  HENT:  Head: Normocephalic and atraumatic.  Eyes: Conjunctivae and EOM are normal. Pupils are equal, round, and reactive to light.  Neck: Normal range of motion. Neck supple.  Cardiovascular: Normal rate and regular rhythm.   Pulmonary/Chest: Effort normal and breath sounds normal.  Abdominal: Soft. Bowel sounds are normal.  Musculoskeletal: Normal range of motion.  Neurological: He is alert and oriented to person, place, and time.  Skin:  Ecchymosis on  right antecubital area, hematoma on tip of tongue, minimal bleeding in gingiva  Psychiatric: He has a normal mood and affect. His behavior is normal.  Nursing note and vitals reviewed.   ED Course  Procedures (including critical care time) Labs Review Labs Reviewed  CBC WITH DIFFERENTIAL/PLATELET - Abnormal; Notable for the following:    Platelets <5 (*)    All other components within normal limits  COMPREHENSIVE METABOLIC PANEL - Abnormal; Notable for the following:    Total Protein 9.3 (*)    AST 65 (*)    ALT 125 (*)    All other components within normal limits  TYPE AND SCREEN  PREPARE PLATELET PHERESIS    Imaging Review No results found. I have personally reviewed and evaluated these images and lab results as part of my medical decision-making.   EKG Interpretation None      MDM   Final diagnoses:  ITP (idiopathic thrombocytopenic purpura)    Platelet count less than 5. Patient is hemodynamically stable. Will order platelets and admit to general medicine.    Donnetta Hutching, MD 02/19/16 1415  Donnetta Hutching, MD 02/19/16 [phone]

## 2016-02-19 NOTE — ED Notes (Signed)
Pt wants to walk to room 1340.  ED tech is walking with him as well as his cousin and brother. Pt denies pain or distress.

## 2016-02-20 DIAGNOSIS — R7989 Other specified abnormal findings of blood chemistry: Secondary | ICD-10-CM | POA: Diagnosis present

## 2016-02-20 DIAGNOSIS — D693 Immune thrombocytopenic purpura: Principal | ICD-10-CM

## 2016-02-20 DIAGNOSIS — R945 Abnormal results of liver function studies: Secondary | ICD-10-CM | POA: Diagnosis present

## 2016-02-20 DIAGNOSIS — Z833 Family history of diabetes mellitus: Secondary | ICD-10-CM | POA: Diagnosis not present

## 2016-02-20 DIAGNOSIS — R58 Hemorrhage, not elsewhere classified: Secondary | ICD-10-CM | POA: Diagnosis present

## 2016-02-20 DIAGNOSIS — K219 Gastro-esophageal reflux disease without esophagitis: Secondary | ICD-10-CM | POA: Diagnosis present

## 2016-02-20 DIAGNOSIS — R748 Abnormal levels of other serum enzymes: Secondary | ICD-10-CM | POA: Diagnosis present

## 2016-02-20 DIAGNOSIS — D72829 Elevated white blood cell count, unspecified: Secondary | ICD-10-CM | POA: Diagnosis present

## 2016-02-20 LAB — COMPREHENSIVE METABOLIC PANEL
ALK PHOS: 59 U/L (ref 38–126)
ALT: 118 U/L — ABNORMAL HIGH (ref 17–63)
AST: 65 U/L — AB (ref 15–41)
Albumin: 3.7 g/dL (ref 3.5–5.0)
Anion gap: 5 (ref 5–15)
BILIRUBIN TOTAL: 0.6 mg/dL (ref 0.3–1.2)
BUN: 12 mg/dL (ref 6–20)
CALCIUM: 9.3 mg/dL (ref 8.9–10.3)
CO2: 23 mmol/L (ref 22–32)
CREATININE: 0.75 mg/dL (ref 0.61–1.24)
Chloride: 103 mmol/L (ref 101–111)
GFR calc Af Amer: >60 mL/min (ref >60)
Glucose, Bld: 143 mg/dL — ABNORMAL HIGH (ref 65–99)
POTASSIUM: 4.2 mmol/L (ref 3.5–5.1)
Sodium: 131 mmol/L — ABNORMAL LOW (ref 135–145)
TOTAL PROTEIN: 10.4 g/dL — AB (ref 6.5–8.1)

## 2016-02-20 LAB — CBC
HCT: 40 % (ref 39.0–52.0)
HEMATOCRIT: 40.6 % (ref 39.0–52.0)
Hemoglobin: 13 g/dL (ref 13.0–17.0)
Hemoglobin: 13.2 g/dL (ref 13.0–17.0)
MCH: 26.4 pg (ref 26.0–34.0)
MCH: 26.5 pg (ref 26.0–34.0)
MCHC: 32.5 g/dL (ref 30.0–36.0)
MCHC: 32.5 g/dL (ref 30.0–36.0)
MCV: 81.3 fL (ref 78.0–100.0)
MCV: 81.5 fL (ref 78.0–100.0)
PLATELETS: 55 10*3/uL — AB (ref 150–400)
RBC: 4.92 MIL/uL (ref 4.22–5.81)
RBC: 4.98 MIL/uL (ref 4.22–5.81)
RDW: 13.7 % (ref 11.5–15.5)
RDW: 13.7 % (ref 11.5–15.5)
WBC: 13.7 10*3/uL — AB (ref 4.0–10.5)
WBC: 21.7 10*3/uL — ABNORMAL HIGH (ref 4.0–10.5)

## 2016-02-20 MED ORDER — SODIUM CHLORIDE 0.9 % IV SOLN
Freq: Once | INTRAVENOUS | Status: AC
  Administered 2016-02-20: 12:00:00 via INTRAVENOUS

## 2016-02-20 MED ORDER — PANTOPRAZOLE SODIUM 40 MG PO TBEC
40.0000 mg | DELAYED_RELEASE_TABLET | Freq: Every day | ORAL | Status: DC
  Filled 2016-02-20: qty 1

## 2016-02-20 NOTE — Progress Notes (Signed)
Dr. Janee Morn aware of results of platelet count. No new order.

## 2016-02-20 NOTE — Progress Notes (Signed)
ONCOLOGY PROGRESS NOTE  DOS 02/20/2016  Subjective  John Brennan is Dr. Maxine Glenn patient with refractory ITP who had multiple lines of treatment as summarized nicely in her last note  "The patient was started on IVIG, dexamethasone, Solu-Medrol and prednisone. He is consider steroid refractory. On 04/23/2015, he is started on CellCept 250 mg twice a day From 04/30/2015 to 04/28/2015, he was readmitted to the hospital due to bleeding. Bone marrow aspirate and biopsy performed on 04/27/2015 confirmed diagnosis of ITP. He was started also in addition to all the above, Nplate injection weekly On 05/04/2015, he received his last platelet transfusion. Starting 05/07/2015, prednisone taper was initiated. On 05/31/2015, prednisone was discontinued and the patient was placed on CellCept along with Nplate on 07/14/2015, CellCept was reduced to 500 mg twice a day along with weekly Nplate injection On 09/10/2015, he suffered from relapse due to recent infection. 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 He received 1 unit of platelet transfusion The patient was readmitted to the hospital from 12/26/2015 to 12/27/2015 with relapsed ITP. He responded to high-dose IVIG and steroids The patient was readmitted to the hospital from 01/17/2016 to 01/19/2016 with relapsed ITP. He responded to high-dose IVIG and steroids. Cellcept was discontinued The patient was readmitted to the hospital from 01/24/2016 to 01/26/2016 with relapsed ITP. He responded to high-dose IVIG and steroids. "  Patient came in with some gum bleeding and oral petechiae and was admitted with platelet counts of less than 5000. Notes no bleeding overnight. The computer translator for Spanish was not available and one  of his family members who speaks good English helped with translation. We discussed his concerning current status of refractory ITP, pros and cons of splenectomy and potential additional treatment options which she was encouraged to discuss further with Dr. Bertis Ruddy tomorrow. He was given Marine scientist regarding ITP, general treatment and splenectomy.  I have messaged Dr. Bertis Ruddy to let her know so that she can see him tomorrow that he has recently completed Rituxan and has been on weekly Nplate.  Liver functions were noted to be elevated and he has been set up for an ultrasound of his abdomen tomorrow as well as an assessment of his spleen again.   Objective  .BP 134/75 mmHg  Pulse 82  Temp(Src) 98.3 F (36.8 C) (Oral)  Resp 18  Ht  (1.6 m)  Wt 149 lb 4.8 oz (67.722 kg)  BMI 26.45 kg/m2  SpO2 99% .  Intake/Output Summary (Last 24 hours) at 02/20/16 1628 Last data filed at 02/20/16 1300  Gross per 24 hour  Intake   2293 ml  Output      0 ml  Net   2293 ml  . Wt Readings from Last 3 Encounters:  02/19/16 149 lb 4.8 oz (67.722 kg)  02/09/16 148 lb (67.132 kg)  01/28/16 151 lb 11.2 oz (68.811 kg)   Oral - MMM, oral mucosal petechiae. CVS s1s2 rrr RS clear to ausculation PA soft no TTP, no overt hepatosplenomegaly noted Ext  no pedal edema neuro alert and 4 no focal neurological deficits   Labs investigations  . CBC Latest Ref Rng 02/20/2016 02/20/2016 02/19/2016  WBC 4.0 - 10.5 K/uL 21.7(H) 13.7(H) 9.2  Hemoglobin 13.0 - 17.0 g/dL 69.6 29.5 28.4  Hematocrit 39.0 - 52.0 %  40.6 40.0 45.5  Platelets 150 - 400 K/uL 55(L) <5(LL) <5(LL)    . CMP Latest Ref Rng 02/20/2016 02/19/2016 02/18/2016  Glucose 65 - 99 mg/dL 409(W) 88 119  BUN 6 - 20 mg/dL 12 12 14.7  Creatinine 0.61 - 1.24 mg/dL 8.29 5.62 0.9  Sodium 130 - 145 mmol/L 131(L) 141 137  Potassium 3.5 - 5.1 mmol/L 4.2 4.9 3.9  Chloride 101 - 111 mmol/L 103 104 -  CO2 22 - 32 mmol/L Calcium  8.9 - 10.3 mg/dL 9.3 9.8 9.1  Total Protein 6.5 - 8.1 g/dL 10.4(H) 9.3(H) 9.2(H)  Total Bilirubin 0.3 - 1.2 mg/dL 0.6 0.7 8.65  Alkaline Phos 38 - 126 U/L 59 70 72  AST 15 - 41 U/L 65(H) 65(H) 73(H)  ALT 17 - 63 U/L 118(H) 125(H) 141(H)    A/P   1) Refractory severe ITP s/p multiple lines of treatment - steroids,  IVIG multiple times, Nplate, recently received Rituxan, cellcept. Plan -received 1g/kg of IVIG yesterday. (has already received 1g/kg  x2 doses on 2/15 and 2/16) -on weekly Nplate -recently completed Rituxan - might need some more time for peak effect -needs to consider Splenectomy about 60% chance of long term control of thrombocytopenia. -Would need to consider splenectomy related vaccinations for further orgasms - if he does not respond will need other immunosuppressive therapies. -was referred to [facility] for 2nd opinion - though stalled due to lack of insurance. -though not studied widely in larger clinical trial in my experience and based on some small case series the use of Velcade alongside Rituxan has been found to be use in this multi-drug refractory setting and could be considered after Splenectomy. (especially if M protein present but even without that) -platelet transfusion with 1h post-count for bleeding and PLT< 5K -Will inform Dr Bertis Ruddy (his primary hematologist) who will see the patient tomorrow.  2) abnormal LFts ? Nplate related, ?IVIG. Previous h/o fatty liver 3) elevated Total protein - likely from IVIG  Plan -hepatitis profile -[place] abd to evaluate liver and spleen   Wyvonnia Lora MD MS AAHIVMS [facility] Hematology/Oncology Physician [facility]  (Office):       [phone] (Work cell):  [phone] (Fax):           [phone]

## 2016-02-20 NOTE — Progress Notes (Signed)
[facility] PROGRESS NOTE  John Brennan JXB:[id number] DOB: 1986/07/18 DOA: 02/19/2016 PCP: No PCP Per Patient  Assessment/Plan: #1 refractory ITP status post multiple lines of treatment Patient has had been treated in the past with steroids, multiple times with IVIG,Nplate, recently received Rituxan, CellCept. Patient had presented with gum bleeding. Currently no further gum bleeding. Patient status post IVIG 02/19/2016 and has been transfused 3 units of platelets since admission. Patient likely may benefit from splenectomy. Platelets this morning was less than 5 and a such patient was given 1 unit of platelets. Posttransfusion platelets at 55,000. Continue IV Decadron. Per oncology.  #2 elevated LFTs Questionable etiology. Acute hepatitis panel has been negative in the past. Will monitor for now.  #3 leukocytosis Likely secondary to IV steroids. Patient with no signs or symptoms of infection. Patient afebrile. Follow for now.  #4 gastroesophageal reflux disease PPI.  #5 prophylaxis PPI for GI prophylaxis. SCDs for DVT prophylaxis.  Code Status: Full Family Communication: Updated patient. No family at bedside. Disposition Plan: Per oncology.   Consultants:  Hematology/oncology Dr.Kale 02/20/2016  Procedures:  3 units platelets 2/25-26/2017  IVIG 02/19/2016  Antibiotics:  None  HPI/Subjective: Patient seen in nature. Patient denies any shortness of breath. No chest pain. No bleeding. Patient tolerating current diet.  Objective: Filed Vitals:   02/20/16 1251 02/20/16 1300  BP: 145/76 134/75  Pulse: 89 82  Temp: 98.4 F (36.9 C) 98.3 F (36.8 C)  Resp: 16 18    Intake/Output Summary (Last 24 hours) at 02/20/16 1951 Last data filed at 02/20/16 1832  Gross per 24 hour  Intake   1777 ml  Output      0 ml  Net   1777 ml   Filed Weights   02/19/16 1530  Weight: 67.722 kg (149 lb 4.8 oz)    Exam:   General:  NAD  Cardiovascular:  RRR  Respiratory: CTAB  Abdomen: Soft, nontender, nondistended, positive bowel sounds.  Musculoskeletal: No clubbing cyanosis or edema.  Data Reviewed: Basic Metabolic Panel:  Recent Labs Lab 02/18/16 1018 02/19/16 1200 02/20/16 0358  NA 137 141 131*  K 3.9 4.9 4.2  CL  --  104 103  CO2 GLUCOSE 117 88 143*  BUN 12.1 12 12   CREATININE 0.9 0.86 0.75  CALCIUM 9.1 9.8 9.3   Liver Function Tests:  Recent Labs Lab 02/18/16 1018 02/19/16 1200 02/20/16 0358  AST 73* 65* 65*  ALT 141* 125* 118*  ALKPHOS 72 70 59  BILITOT 0.56 0.7 0.6  PROT 9.2* 9.3* 10.4*  ALBUMIN 3.6 4.2 3.7   No results for input(s): LIPASE, AMYLASE in the last 168 hours. No results for input(s): AMMONIA in the last 168 hours. CBC:  Recent Labs Lab 02/18/16 1018 02/19/16 1200 02/20/16 0358 02/20/16 1456  WBC 10.5* 9.2 13.7* 21.7*  NEUTROABS 7.7* 7.0  --   --   HGB 15.6 15.4 13.0 13.2  HCT 47.3 45.5 40.0 40.6  MCV 79.0* 81.7 81.3 81.5  PLT 3* <5* <5* 55*   Cardiac Enzymes: No results for input(s): CKTOTAL, CKMB, CKMBINDEX, TROPONINI in the last 168 hours. BNP (last 3 results) No results for input(s): BNP in the last 8760 hours.  ProBNP (last 3 results) No results for input(s): PROBNP in the last 8760 hours.  CBG: No results for input(s): GLUCAP in the last 168 hours.  No results found for this or any previous visit (from the past 240 hour(s)).   Studies: No results found.  Scheduled Meds: . dexamethasone  40 mg Intravenous Q24H  . sodium chloride flush  3 mL Intravenous Q12H   Continuous Infusions:   Principal Problem:   ITP (idiopathic thrombocytopenic purpura) Active Problems:   Thrombocytopenia (HCC)   GERD (gastroesophageal reflux disease)   Abnormal liver function test   Leukocytosis    Time spent: 40 mins    [facility] MD [facility] Pager [phone]. If 7PM-7AM, please contact night-coverage at [email or web address], password [facility] 02/20/2016,  7:51 PM  LOS: 0 days

## 2016-02-20 NOTE — Progress Notes (Signed)
Patient's vital signs remained WNL through out IVIG infusion. Tolerated it well.

## 2016-02-21 ENCOUNTER — Other Ambulatory Visit: Payer: Self-pay

## 2016-02-21 ENCOUNTER — Other Ambulatory Visit: Payer: Self-pay | Admitting: Hematology and Oncology

## 2016-02-21 ENCOUNTER — Ambulatory Visit: Payer: Self-pay | Admitting: Hematology and Oncology

## 2016-02-21 DIAGNOSIS — D693 Immune thrombocytopenic purpura: Secondary | ICD-10-CM

## 2016-02-21 DIAGNOSIS — R748 Abnormal levels of other serum enzymes: Secondary | ICD-10-CM

## 2016-02-21 DIAGNOSIS — D696 Thrombocytopenia, unspecified: Secondary | ICD-10-CM

## 2016-02-21 LAB — HEPATITIS PANEL, ACUTE
HCV Ab: 0.2 s/co ratio (ref 0.0–0.9)
Hep A IgM: NEGATIVE
Hep B C IgM: NEGATIVE
Hepatitis B Surface Ag: NEGATIVE

## 2016-02-21 LAB — CBC WITH DIFFERENTIAL/PLATELET
BASOS ABS: 0 10*3/uL (ref 0.0–0.1)
Basophils Relative: 0 %
EOS ABS: 0 10*3/uL (ref 0.0–0.7)
Eosinophils Relative: 0 %
HCT: 40 % (ref 39.0–52.0)
HEMOGLOBIN: 13 g/dL (ref 13.0–17.0)
LYMPHS PCT: 6 %
Lymphs Abs: 1.6 10*3/uL (ref 0.7–4.0)
MCH: 26.5 pg (ref 26.0–34.0)
MCHC: 32.5 g/dL (ref 30.0–36.0)
MCV: 81.6 fL (ref 78.0–100.0)
MONOS PCT: 5 %
Monocytes Absolute: 1.3 10*3/uL — ABNORMAL HIGH (ref 0.1–1.0)
NEUTROS ABS: 23.5 10*3/uL — AB (ref 1.7–7.7)
NEUTROS PCT: 89 %
PLATELETS: 93 10*3/uL — AB (ref 150–400)
RBC: 4.9 MIL/uL (ref 4.22–5.81)
RDW: 14.1 % (ref 11.5–15.5)
WBC: 26.4 10*3/uL — AB (ref 4.0–10.5)

## 2016-02-21 LAB — PREPARE PLATELET PHERESIS
UNIT DIVISION: 0
UNIT DIVISION: 0
UNIT DIVISION: 0
Unit division: 0

## 2016-02-21 LAB — COMPREHENSIVE METABOLIC PANEL
ALBUMIN: 3.5 g/dL (ref 3.5–5.0)
ALK PHOS: 59 U/L (ref 38–126)
ALT: 107 U/L — AB (ref 17–63)
AST: 49 U/L — ABNORMAL HIGH (ref 15–41)
Anion gap: 6 (ref 5–15)
BUN: 14 mg/dL (ref 6–20)
CALCIUM: 9.1 mg/dL (ref 8.9–10.3)
CHLORIDE: 106 mmol/L (ref 101–111)
CO2: 24 mmol/L (ref 22–32)
CREATININE: 0.74 mg/dL (ref 0.61–1.24)
GFR calc Af Amer: >60 mL/min (ref >60)
GFR calc non Af Amer: >60 mL/min (ref >60)
GLUCOSE: 132 mg/dL — AB (ref 65–99)
Potassium: 3.8 mmol/L (ref 3.5–5.1)
SODIUM: 136 mmol/L (ref 135–145)
Total Bilirubin: 0.4 mg/dL (ref 0.3–1.2)
Total Protein: 9.3 g/dL — ABNORMAL HIGH (ref 6.5–8.1)

## 2016-02-21 MED ORDER — AZATHIOPRINE 75 MG PO TABS
75.0000 mg | ORAL_TABLET | Freq: Every day | ORAL | Status: DC
Start: 2016-02-21 — End: 2016-03-21

## 2016-02-21 MED FILL — azaTHIOprine 50 MG TABS: 50 | 30 days supply | Qty: 45 | Fill #0

## 2016-02-21 NOTE — Discharge Summary (Signed)
Physician Discharge Summary  John Brennan NWG:[id number] DOB: [birth year] DOA: 02/19/2016  PCP: No PCP Per Patient  Admit date: 02/19/2016 Discharge date: 02/21/2016  Time spent: 65 minutes  Recommendations for Outpatient Follow-up:  1. Follow-up with Dr. Bertis Brennan hematology/oncology a one-week/as scheduled. On follow-up she'll need CBC with differential, comprehensive metabolic profile done.   Discharge Diagnoses:  Principal Problem:   ITP (idiopathic thrombocytopenic purpura) Active Problems:   Thrombocytopenia (HCC)   GERD (gastroesophageal reflux disease)   Abnormal liver function test   Leukocytosis   Discharge Condition: Stable and improved  Diet recommendation: Regular  Filed Weights   02/19/16 1530  Weight: 67.722 kg (149 lb 4.8 oz)    History of present illness:  Per Dr John Brennan John Brennan is a 30 y.o. male with a past medical history of refractory ITP who was recently hospitalized and discharged on Feb 17th after being given IVIG, high-dose steroids and Nplate. Patient's platelet count responded appropriately at that time. He was seen at the hematology clinic and given another dose of nplate on February 24. Overnight patient developed red spots in his mouth and he started having some bleeding. So he decided to come into the hospital for further evaluation. Patient speaks Spanish and very limited [place]. His cousin was at the bedside, who interpreted. Patient denied any other pain. No other bleeding from any other site. Mouth was not actively bleeding at time of admission. He denied any headaches, shortness of breath. No nausea, vomiting. No falls or injuries recently. No joint pains.  In the emergency department, his platelet counts were less than 5. He'll be hospitalized for further management  Hospital Course:  #1 refractory ITP status post multiple lines of treatment Patient has had been treated in the past with steroids, multiple times with IVIG,Nplate,  recently received Rituxan, CellCept. Patient had presented with gum bleeding. Patient did not have any further gum bleeding during the hospitalization. Patient was admitted and patient was given IVIG 02/19/2016 and was transfused 3 units of platelets and given high-dose IV steroids during the hospitalization. Patient platelets improved such that by day of discharge his platelet count was at 93,000. Patient was seen in consultation by oncology and followed by oncology throughout the hospitalization. Patient may likely benefit from splenectomy. Patient improved clinically and patient will be discharged home in stable and improved condition and will follow-up closely with oncology twice a week in the outpatient setting.   #2 elevated LFTs Questionable etiology. Acute hepatitis panel has been negative in the past. Patient has had an extensive workup by gastroenterology in the past and it was felt patient's elevated LFTs were likely secondary to fatty liver. LFTs were trending down during the hospitalization. No further workup was needed during the hospitalization and patient will follow-up as outpatient.   #3 leukocytosis Likely secondary to high-dose IV steroids. Patient with no signs or symptoms of infection. Patient remained afebrile. Outpatient follow-up.  #4 gastroesophageal reflux disease Patient was maintained on a PPI.   Procedures:  3 units platelets 2/25-26/2017  IVIG 02/19/2016  Consultations:  Hematology/oncology John Brennan 02/20/2016  Discharge Exam: Filed Vitals:   02/20/16 2113 02/21/16 0525  BP: 135/74 122/68  Pulse: 70 66  Temp: 97.7 F (36.5 C) 97.7 F (36.5 C)  Resp: 18 16    General: NAD Cardiovascular: RRR Respiratory: CTAB  Discharge Instructions   Discharge Instructions    Diet general    Complete by:  As directed      Discharge instructions    Complete by:  As directed   Follow up with Dr John Brennan as scheduled.     Increase activity slowly    Complete by:   As directed           Current Discharge Medication List    CONTINUE these medications which have NOT CHANGED   Details  Azathioprine 75 MG TABS Take 1 tablet (75 mg total) by mouth daily. Qty: 30 each, Refills: 6       No Known Allergies Follow-up Information    Follow up with John Brennan, NI, MD.   Specialty:  Hematology and Oncology   Why:  f/u as scheduled.   Contact information:   [address] [place] [address] [phone]        The results of significant diagnostics from this hospitalization (including imaging, microbiology, ancillary and laboratory) are listed below for reference.    Significant Diagnostic Studies: No results found.  Microbiology: No results found for this or any previous visit (from the past 240 hour(s)).   Labs: Basic Metabolic Panel:  Recent Labs Lab 02/18/16 1018 02/19/16 1200 02/20/16 0358 02/21/16 0404  NA 137 141 131* 136  K 3.9 4.9 4.2 3.8  CL  --  104 103 106  CO2 GLUCOSE 117 88 143* 132*  BUN 12.1 12 12 14   CREATININE 0.9 0.86 0.75 0.74  CALCIUM 9.1 9.8 9.3 9.1   Liver Function Tests:  Recent Labs Lab 02/18/16 1018 02/19/16 1200 02/20/16 0358 02/21/16 0404  AST 73* 65* 65* 49*  ALT 141* 125* 118* 107*  ALKPHOS 72 70 59 59  BILITOT 0.56 0.7 0.6 0.4  PROT 9.2* 9.3* 10.4* 9.3*  ALBUMIN 3.6 4.2 3.7 3.5   No results for input(s): LIPASE, AMYLASE in the last 168 hours. No results for input(s): AMMONIA in the last 168 hours. CBC:  Recent Labs Lab 02/18/16 1018 02/19/16 1200 02/20/16 0358 02/20/16 1456 02/21/16 0404  WBC 10.5* 9.2 13.7* 21.7* 26.4*  NEUTROABS 7.7* 7.0  --   --  23.5*  HGB 15.6 15.4 13.0 13.2 13.0  HCT 47.3 45.5 40.0 40.6 40.0  MCV 79.0* 81.7 81.3 81.5 81.6  PLT 3* <5* <5* 55* 93*   Cardiac Enzymes: No results for input(s): CKTOTAL, CKMB, CKMBINDEX, TROPONINI in the last 168 hours. BNP: BNP (last 3 results) No results for input(s): BNP in the last 8760  hours.  ProBNP (last 3 results) No results for input(s): PROBNP in the last 8760 hours.  CBG: No results for input(s): GLUCAP in the last 168 hours.     SignedRamiro Harvest MD.  John Brennan 02/21/2016, 11:16 AM

## 2016-02-21 NOTE — Progress Notes (Signed)
John Brennan   DOB:[birth year]   WU#:[id number]    Subjective: History is obtained via the interpreter. Since admission to the hospital, he denies further bleeding. The patient denies any recent signs or symptoms of bleeding such as spontaneous epistaxis, hematuria or hematochezia.   Objective:  Filed Vitals:   02/20/16 2113 02/21/16 0525  BP: 135/74 122/68  Pulse: 70 66  Temp: 97.7 F (36.5 C) 97.7 F (36.5 C)  Resp: 18 16     Intake/Output Summary (Last 24 hours) at 02/21/16 1050 Last data filed at 02/20/16 2113  Gross per 24 hour  Intake   1059 ml  Output      0 ml  Net   1059 ml    GENERAL:alert, no distress and comfortable SKIN: skin color, texture, turgor are normal, no rashes or significant lesions EYES: normal, Conjunctiva are pink and non-injected, sclera clear Musculoskeletal:no cyanosis of digits and no clubbing  NEURO: alert & oriented x 3 with fluent speech, no focal motor/sensory deficits   Labs:  Lab Results  Component Value Date   WBC 26.4* 02/21/2016   HGB 13.0 02/21/2016   HCT 40.0 02/21/2016   MCV 81.6 02/21/2016   PLT 93* 02/21/2016   NEUTROABS 23.5* 02/21/2016    Lab Results  Component Value Date   NA 136 02/21/2016   K 3.8 02/21/2016   CL 106 02/21/2016   CO2 24 02/21/2016   Assessment & Plan:  Refractory ITP Via an interpreter, I discussed with the patient treatment options. Despite being on Nplate, rituximab, corticosteroid therapy and CellCept, he had multiple recurrence/relapses of ITP. We discussed potential splenectomy We also discussed monthly IVIG, cyclosporine or azathioprine I recommend we continue Nplate  Ultimately, he is in agreement to try azathioprine Second opinion is obtained but is not until June to go to [facility] I will see him next week but he will come to twice a week to get his blood work monitored  Leukocytosis Likely due to high-dose steroids Monitor only  Elevated liver enzymes He had extensive  evaluation in the past Monitor only  Discharge planning He should be ready for discharge later today Appointment is made to see him next week Will sign off  [facility], Benicio Manna, MD 02/21/2016  10:50 AM

## 2016-02-21 NOTE — Progress Notes (Signed)
Discharge instructions in Spanish reviewed with patient.  Questions had previously been answered through interpreter Amil Amen.  Patient verbalized understanding that he is to follow-up at [facility] on 02/22/16 and has medication at the [facility] outpatient pharmacy he needs to pick up immediately upon discharge.  Patient ambulatory from floor with plans to drive himself home.

## 2016-02-22 ENCOUNTER — Other Ambulatory Visit (HOSPITAL_BASED_OUTPATIENT_CLINIC_OR_DEPARTMENT_OTHER): Payer: Self-pay

## 2016-02-22 ENCOUNTER — Telehealth: Payer: Self-pay | Admitting: Hematology and Oncology

## 2016-02-22 ENCOUNTER — Other Ambulatory Visit: Payer: Self-pay | Admitting: Hematology and Oncology

## 2016-02-22 DIAGNOSIS — D693 Immune thrombocytopenic purpura: Secondary | ICD-10-CM

## 2016-02-22 LAB — CBC WITH DIFFERENTIAL/PLATELET
BASO%: 0.1 % (ref 0.0–2.0)
BASOS ABS: 0 10*3/uL (ref 0.0–0.1)
EOS%: 0 % (ref 0.0–7.0)
Eosinophils Absolute: 0 10*3/uL (ref 0.0–0.5)
HCT: 43.9 % (ref 38.4–49.9)
HGB: 14.7 g/dL (ref 13.0–17.1)
LYMPH%: 9 % — ABNORMAL LOW (ref 14.0–49.0)
MCH: 27 pg — AB (ref 27.2–33.4)
MCHC: 33.5 g/dL (ref 32.0–36.0)
MCV: 80.7 fL (ref 79.3–98.0)
MONO#: 2.1 10*3/uL — ABNORMAL HIGH (ref 0.1–0.9)
MONO%: 7.2 % (ref 0.0–14.0)
NEUT#: 24.7 10*3/uL — ABNORMAL HIGH (ref 1.5–6.5)
NEUT%: 83.7 % — AB (ref 39.0–75.0)
Platelets: 232 10*3/uL (ref 140–400)
RBC: 5.44 10*6/uL (ref 4.20–5.82)
RDW: 14.4 % (ref 11.0–14.6)
WBC: 29.6 10*3/uL — ABNORMAL HIGH (ref 4.0–10.3)
lymph#: 2.7 10*3/uL (ref 0.9–3.3)
nRBC: 0 % (ref 0–0)

## 2016-02-22 LAB — MULTIPLE MYELOMA PANEL, SERUM
ALBUMIN SERPL ELPH-MCNC: 3.5 g/dL (ref 2.9–4.4)
ALPHA 1: 0.2 g/dL (ref 0.0–0.4)
ALPHA2 GLOB SERPL ELPH-MCNC: 0.6 g/dL (ref 0.4–1.0)
Albumin/Glob SerPl: 0.6 — ABNORMAL LOW (ref 0.7–1.7)
B-GLOBULIN SERPL ELPH-MCNC: 1.2 g/dL (ref 0.7–1.3)
GAMMA GLOB SERPL ELPH-MCNC: 4.5 g/dL — AB (ref 0.4–1.8)
Globulin, Total: 6.5 g/dL — ABNORMAL HIGH (ref 2.2–3.9)
IGA: 117 mg/dL (ref 90–386)
IGM, SERUM: 65 mg/dL (ref 20–172)
IgG (Immunoglobin G), Serum: 4678 mg/dL — ABNORMAL HIGH (ref 700–1600)
TOTAL PROTEIN ELP: 10 g/dL — AB (ref 6.0–8.5)

## 2016-02-22 LAB — TECHNOLOGIST REVIEW

## 2016-02-22 NOTE — Telephone Encounter (Signed)
Left vm to [employer] in ref to referral.

## 2016-02-25 ENCOUNTER — Ambulatory Visit (HOSPITAL_BASED_OUTPATIENT_CLINIC_OR_DEPARTMENT_OTHER): Payer: Self-pay

## 2016-02-25 ENCOUNTER — Other Ambulatory Visit (HOSPITAL_BASED_OUTPATIENT_CLINIC_OR_DEPARTMENT_OTHER): Payer: Self-pay

## 2016-02-25 VITALS — BP 131/81 | HR 79 | Temp 98.6°F

## 2016-02-25 DIAGNOSIS — D693 Immune thrombocytopenic purpura: Secondary | ICD-10-CM

## 2016-02-25 DIAGNOSIS — D696 Thrombocytopenia, unspecified: Secondary | ICD-10-CM

## 2016-02-25 LAB — CBC WITH DIFFERENTIAL/PLATELET
BASO%: 0.4 % (ref 0.0–2.0)
BASOS ABS: 0.1 10*3/uL (ref 0.0–0.1)
EOS ABS: 0.2 10*3/uL (ref 0.0–0.5)
EOS%: 1.6 % (ref 0.0–7.0)
HEMATOCRIT: 48.6 % (ref 38.4–49.9)
HGB: 16.3 g/dL (ref 13.0–17.1)
LYMPH%: 26.6 % (ref 14.0–49.0)
MCH: 27 pg — ABNORMAL LOW (ref 27.2–33.4)
MCHC: 33.5 g/dL (ref 32.0–36.0)
MCV: 80.6 fL (ref 79.3–98.0)
MONO#: 0.9 10*3/uL (ref 0.1–0.9)
MONO%: 7.3 % (ref 0.0–14.0)
NEUT%: 64.1 % (ref 39.0–75.0)
NEUTROS ABS: 8.3 10*3/uL — AB (ref 1.5–6.5)
PLATELETS: 148 10*3/uL (ref 140–400)
RBC: 6.03 10*6/uL — AB (ref 4.20–5.82)
RDW: 14.5 % (ref 11.0–14.6)
WBC: 13 10*3/uL — AB (ref 4.0–10.3)
lymph#: 3.4 10*3/uL — ABNORMAL HIGH (ref 0.9–3.3)
nRBC: 0 % (ref 0–0)

## 2016-02-25 MED ORDER — ROMIPLOSTIM INJECTION 500 MCG
670.0000 ug | SUBCUTANEOUS | Status: DC
  Administered 2016-02-25: 670 ug via SUBCUTANEOUS
  Filled 2016-02-25: qty 1

## 2016-02-29 ENCOUNTER — Encounter (HOSPITAL_COMMUNITY): Payer: Self-pay | Admitting: Emergency Medicine

## 2016-02-29 ENCOUNTER — Inpatient Hospital Stay (HOSPITAL_COMMUNITY)
Admission: EM | Admit: 2016-02-29 | Discharge: 2016-03-01 | DRG: 813 | Disposition: A | Discharge status: Home / Self Care | Payer: Medicaid Other | Attending: Internal Medicine | Admitting: Internal Medicine

## 2016-02-29 ENCOUNTER — Other Ambulatory Visit (HOSPITAL_BASED_OUTPATIENT_CLINIC_OR_DEPARTMENT_OTHER): Payer: Self-pay

## 2016-02-29 DIAGNOSIS — D696 Thrombocytopenia, unspecified: Secondary | ICD-10-CM | POA: Diagnosis present

## 2016-02-29 DIAGNOSIS — D693 Immune thrombocytopenic purpura: Principal | ICD-10-CM | POA: Diagnosis present

## 2016-02-29 DIAGNOSIS — Z833 Family history of diabetes mellitus: Secondary | ICD-10-CM

## 2016-02-29 LAB — CBC WITH DIFFERENTIAL/PLATELET
BASO%: 1.2 % (ref 0.0–2.0)
BASOS ABS: 0.1 10*3/uL (ref 0.0–0.1)
EOS ABS: 0.1 10*3/uL (ref 0.0–0.5)
EOS%: 0.9 % (ref 0.0–7.0)
HCT: 45.7 % (ref 38.4–49.9)
HGB: 15.1 g/dL (ref 13.0–17.1)
LYMPH%: 20.7 % (ref 14.0–49.0)
MCH: 26.1 pg — AB (ref 27.2–33.4)
MCHC: 33 g/dL (ref 32.0–36.0)
MCV: 79 fL — AB (ref 79.3–98.0)
MONO#: 0.7 10*3/uL (ref 0.1–0.9)
MONO%: 6.9 % (ref 0.0–14.0)
NEUT%: 70.3 % (ref 39.0–75.0)
NEUTROS ABS: 7.3 10*3/uL — AB (ref 1.5–6.5)
Platelets: 12 10*3/uL — ABNORMAL LOW (ref 140–400)
RBC: 5.79 10*6/uL (ref 4.20–5.82)
RDW: 15.1 % — AB (ref 11.0–14.6)
WBC: 10.3 10*3/uL (ref 4.0–10.3)
lymph#: 2.1 10*3/uL (ref 0.9–3.3)

## 2016-02-29 LAB — I-STAT CHEM 8, ED
BUN: 11 mg/dL (ref 6–20)
CALCIUM ION: 1.21 mmol/L (ref 1.12–1.23)
Chloride: 103 mmol/L (ref 101–111)
Creatinine, Ser: 0.7 mg/dL (ref 0.61–1.24)
Glucose, Bld: 111 mg/dL — ABNORMAL HIGH (ref 65–99)
HEMATOCRIT: 48 % (ref 39.0–52.0)
Hemoglobin: 16.3 g/dL (ref 13.0–17.0)
Potassium: 3.8 mmol/L (ref 3.5–5.1)
SODIUM: 141 mmol/L (ref 135–145)
TCO2: 26 mmol/L (ref 0–100)

## 2016-02-29 MED ORDER — IMMUNE GLOBULIN (HUMAN) 10 GM/100ML IV SOLN
1.0000 g/kg | INTRAVENOUS | Status: AC
Start: 2016-02-29 — End: 2016-03-01
  Administered 2016-02-29 – 2016-03-01 (×2): 70 g via INTRAVENOUS
  Filled 2016-02-29 (×2): qty 100

## 2016-02-29 MED ORDER — ONDANSETRON HCL 4 MG/2ML IJ SOLN: 4.0000 mg | Freq: Four times a day (QID) | INTRAMUSCULAR | Status: DC | PRN

## 2016-02-29 MED ORDER — ONDANSETRON HCL 4 MG PO TABS: 4.0000 mg | ORAL_TABLET | Freq: Four times a day (QID) | ORAL | Status: DC | PRN

## 2016-02-29 MED ORDER — AZATHIOPRINE 50 MG PO TABS
75.0000 mg | ORAL_TABLET | Freq: Every day | ORAL | Status: DC
  Administered 2016-03-01: 75 mg via ORAL
  Filled 2016-02-29 (×2): qty 2

## 2016-02-29 NOTE — ED Notes (Addendum)
Pt was seen at the cancer center this morning and his platelet count is 12. Pt needs to be admitted for this, and the on call oncologist Jasmine December(Sharon) needs to be been paged per nurse at the cancer center. Pt is alert and orient x 4. Pt denies lightheadedness, dizziness, or other symptoms

## 2016-02-29 NOTE — ED Provider Notes (Signed)
CSN: [id number]     Arrival date & time 02/29/16  1442 History   First MD Initiated Contact with Patient 02/29/16 1524     Chief Complaint  Patient presents with  . Abnormal Lab    low platelet      (Consider location/radiation/quality/duration/timing/severity/associated sxs/prior Treatment) HPI   This is a 30 year old Spanish-speaking male with a history of ITP. He was sent to the emergency department by his oncologist today. He was seen one week ago with a platelet count of 5 and required platelet transfusion. His platelet count is 12 today with no active bleeding. He has not noticed any bleeding, sore rashes. I spoke with Dr. Alcide Evener who is on-call for oncology. He asks that the patient be admitted by the hospitalist team for IVIG as he has been refractory to all other treatments. He asked that they deliver 1 g IV Ig per kilogram. Repeat a CBC in the morning. The patient denies any other complaints, fevers, chills, myalgias, chest pain, shortness of breath, abdominal pain, hematuria, other urinary symptoms.  Past Medical History  Diagnosis Date  . Thrombocytopenia (HCC)   . Drug-induced skin rash 07/14/2015  . Fatty liver   . Acne 12/31/2015   History reviewed. No pertinent past surgical history. Family History  Problem Relation Age of Onset  . Diabetes Father    Social History  Substance Use Topics  . Smoking status: Never Smoker   . Smokeless tobacco: Never Used  . Alcohol Use: No     Comment: occassional beer    Review of Systems  Ten systems reviewed and are negative for acute change, except as noted in the HPI.    Allergies  Review of patient's allergies indicates no known allergies.  Home Medications   Prior to Admission medications   Medication Sig Start Date End Date Taking? Authorizing Provider  Azathioprine 75 MG TABS Take 1 tablet (75 mg total) by mouth daily. 02/21/16  Yes Ni Gorsuch, MD   BP 124/84 mmHg  Pulse 99  Temp(Src) 98.6 F (37 C) (Oral)  Resp  16  SpO2 98% Physical Exam  Constitutional: He appears well-developed and well-nourished. No distress.  HENT:  Head: Normocephalic and atraumatic.  Eyes: Conjunctivae are normal. No scleral icterus.  Neck: Normal range of motion. Neck supple.  Cardiovascular: Normal rate, regular rhythm and normal heart sounds.   Pulmonary/Chest: Effort normal and breath sounds normal. No respiratory distress.  Abdominal: Soft. There is no tenderness.  Musculoskeletal: He exhibits no edema.  Neurological: He is alert.  Skin: Skin is warm and dry. He is not diaphoretic.  Fine petechial rash on the soft palate  Psychiatric: His behavior is normal.  Nursing note and vitals reviewed.   ED Course  Procedures (including critical care time) Labs Review Labs Reviewed - No data to display  Imaging Review No results found. I have personally reviewed and evaluated these images and lab results as part of my medical decision-making.   EKG Interpretation None      MDM   Final diagnoses:  ITP (idiopathic thrombocytopenic purpura)  Thrombocytopenia (HCC)    3:41 PM BP 124/84 mmHg  Pulse 99  Temp(Src) 98.6 F (37 C) (Oral)  Resp 16  SpO2 98% Patient here with ITP, refractory to treatment in the oncology service. His oncologist is Dr. Bertis Ruddy who will see the patient in the morning after repeat CBC. I spoken with Dr. Izola Price who will admit the patient. I have ordered IV Ig.    Arthor Captain,  PA-C 03/01/16 1236  Tilden FossaElizabeth Rees, MD 03/08/16 [phone]

## 2016-02-29 NOTE — Progress Notes (Signed)
The following was provided to the patient in Spanish: [facility] spoke to patient at bedside. Patient confirms he [place] have a pcp [place]  [facility] provided [facility], informed patient of services there.  EDCM also provided patient with list of pcps who accept self pay patients, list of discount pharmacies and websites needymeds.org and GoodRX.com for medication assistance, phone number to inquire about the orange card, phone number to inquire about Mediciad, phone number to inquire about the Affordable Care Act, financial resources in the community such as local churches, salvation army, urban ministries, and dental assistance for uninsured patients.  Patient thankful for resources.  No further EDCM needs at this time.

## 2016-02-29 NOTE — H&P (Signed)
[facility] History and Physical  John Brennan WUJ:[id number] DOB: Jun 24, 1986 DOA: 02/29/2016  Referring physician: ED PA Tiburcio Pea  Oncologist Dr. Bertis Ruddy   Chief Complaint: sent from oncologist office for treatment   HPI:  30 y.o. male with refractory ITP who was recently hospitalized and discharged on Feb 17th and again Feb 27th, after being given IVIG, high-dose steroids and Nplate. Patient's platelet count responded appropriately at that time. He was seen at the hematology clinic today and Plt noted to be 12 K and pt was referred for an admission. Patient denies any pain. No bleeding from any site. He denies any headaches, shortness of breath. No nausea, vomiting. No falls or injuries recently. No joint pains.  In the emergency department, his platelet counts were 12K. [facility] asked to admit for further evaluation.   Assessment and Plan: Active Problems:   ITP (idiopathic thrombocytopenic purpura)  - order for IVIG placed  - admit for further management - CBC in AM - monitor for signs of bleeding   SCD's for DV prophylaxis   Radiological Exams on Admission: No results found.   Code Status: Full Family Communication: Pt at bedside Disposition Plan: Admit for further evaluation    Danie Binder [facility] [phone]   Review of Systems:  Constitutional: Negative for fever, chills and malaise/fatigue. Negative for diaphoresis.  HENT: Negative for hearing loss, ear pain, nosebleeds, congestion, sore throat, neck pain, tinnitus and ear discharge.   Eyes: Negative for blurred vision, double vision, photophobia, pain, discharge and redness.  Respiratory: Negative for cough, hemoptysis, sputum production, shortness of breath, wheezing and stridor.   Cardiovascular: Negative for chest pain, palpitations, orthopnea, claudication and leg swelling.  Gastrointestinal: Negative for nausea, vomiting and abdominal pain.  Genitourinary: Negative for dysuria, urgency, frequency, hematuria and  flank pain.  Musculoskeletal: Negative for myalgias, back pain, joint pain and falls.  Skin: Negative for itching and rash.  Neurological: Negative for dizziness and weakness.  Endo/Heme/Allergies: Negative for environmental allergies and polydipsia. Does not bruise/bleed easily.  Psychiatric/Behavioral: Negative for suicidal ideas. The patient is not nervous/anxious.      Past Medical History  Diagnosis Date  . Thrombocytopenia (HCC)   . Drug-induced skin rash 07/14/2015  . Fatty liver   . Acne 12/31/2015    History reviewed. No pertinent past surgical history.  Social History:  reports that he has never smoked. He has never used smokeless tobacco. He reports that he does not drink alcohol or use illicit drugs.  No Known Allergies  Family History  Problem Relation Age of Onset  . Diabetes Father     Prior to Admission medications   Medication Sig Start Date End Date Taking? Authorizing Provider  Azathioprine 75 MG TABS Take 1 tablet (75 mg total) by mouth daily. 02/21/16  Yes Artis Delay, MD    Physical Exam: Filed Vitals:   02/29/16 1455  BP: 124/84  Pulse: 99  Temp: 98.6 F (37 C)  TempSrc: Oral  Resp: 16  SpO2: 98%    Physical Exam  Constitutional: Appears well-developed and well-nourished. No distress.  HENT: Normocephalic. External right and left ear normal. Oropharynx is clear and moist.  Eyes: Conjunctivae and EOM are normal. PERRLA, no scleral icterus.  Neck: Normal ROM. Neck supple. No JVD. No tracheal deviation. No thyromegaly.  CVS: RRR, S1/S2 +, no murmurs, no gallops, no carotid bruit.  Pulmonary: Effort and breath sounds normal, no stridor, rhonchi, wheezes, rales.  Abdominal: Soft. BS +,  no distension, tenderness, rebound or guarding.  Musculoskeletal: Normal range of motion. No edema and no tenderness.  Lymphadenopathy: No lymphadenopathy noted, cervical, inguinal. Neuro: Alert. Normal reflexes, muscle tone coordination. No cranial nerve  deficit. Skin: Skin is warm and dry. No rash noted. Not diaphoretic. No erythema. No pallor.  Psychiatric: Normal mood and affect. Behavior, judgment, thought content normal.   Labs on Admission:  Basic Metabolic Panel:  Recent Labs Lab 02/29/16 1622  NA 141  K 3.8  CL 103  GLUCOSE 111*  BUN 11  CREATININE 0.70   CBC:  Recent Labs Lab 02/25/16 1520 02/29/16 1413 02/29/16 1622  WBC 13.0* 10.3  --   NEUTROABS 8.3* 7.3*  --   HGB 16.3 15.1 16.3  HCT 48.6 45.7 48.0  MCV 80.6 79.0*  --   PLT 148 12*  --    EKG: Not done yet   If 7PM-7AM, please contact night-coverage [email or web address] Password [facility] 02/29/2016, 5:53 PM

## 2016-03-01 ENCOUNTER — Other Ambulatory Visit: Payer: Self-pay | Admitting: Hematology and Oncology

## 2016-03-01 DIAGNOSIS — D696 Thrombocytopenia, unspecified: Secondary | ICD-10-CM

## 2016-03-01 LAB — CBC WITH DIFFERENTIAL/PLATELET
BASOS ABS: 0.1 10*3/uL (ref 0.0–0.1)
Basophils Relative: 1 %
EOS ABS: 0.1 10*3/uL (ref 0.0–0.7)
Eosinophils Relative: 1 %
HCT: 40 % (ref 39.0–52.0)
Hemoglobin: 12.9 g/dL — ABNORMAL LOW (ref 13.0–17.0)
Lymphocytes Relative: 21 %
Lymphs Abs: 1.6 10*3/uL (ref 0.7–4.0)
MCH: 26.4 pg (ref 26.0–34.0)
MCHC: 32.3 g/dL (ref 30.0–36.0)
MCV: 82 fL (ref 78.0–100.0)
MONO ABS: 0.8 10*3/uL (ref 0.1–1.0)
Monocytes Relative: 10 %
NEUTROS PCT: 67 %
Neutro Abs: 5.2 10*3/uL (ref 1.7–7.7)
PLATELETS: 68 10*3/uL — AB (ref 150–400)
RBC: 4.88 MIL/uL (ref 4.22–5.81)
RDW: 14.3 % (ref 11.5–15.5)
WBC: 7.8 10*3/uL (ref 4.0–10.5)

## 2016-03-01 LAB — BASIC METABOLIC PANEL
ANION GAP: 7 (ref 5–15)
BUN: 11 mg/dL (ref 6–20)
CALCIUM: 9 mg/dL (ref 8.9–10.3)
CO2: 25 mmol/L (ref 22–32)
CREATININE: 0.92 mg/dL (ref 0.61–1.24)
Chloride: 106 mmol/L (ref 101–111)
Glucose, Bld: 98 mg/dL (ref 65–99)
Potassium: 3.8 mmol/L (ref 3.5–5.1)
SODIUM: 138 mmol/L (ref 135–145)

## 2016-03-01 LAB — CBC
HCT: 39.9 % (ref 39.0–52.0)
Hemoglobin: 13.2 g/dL (ref 13.0–17.0)
MCH: 26.9 pg (ref 26.0–34.0)
MCHC: 33.1 g/dL (ref 30.0–36.0)
MCV: 81.3 fL (ref 78.0–100.0)
PLATELETS: 31 10*3/uL — AB (ref 150–400)
RBC: 4.91 MIL/uL (ref 4.22–5.81)
RDW: 14.2 % (ref 11.5–15.5)
WBC: 7.3 10*3/uL (ref 4.0–10.5)

## 2016-03-01 MED ORDER — ACETAMINOPHEN 325 MG PO TABS
650.0000 mg | ORAL_TABLET | Freq: Four times a day (QID) | ORAL | Status: DC | PRN
  Administered 2016-03-01: 650 mg via ORAL
  Filled 2016-03-01: qty 2

## 2016-03-01 NOTE — Discharge Instructions (Signed)
Prpura trombocitopnica idioptica (Idiopathic Thrombocytopenic Purpura) La prpura trombocitopnica idioptica (PTI) es una enfermedad en la que el sistema de defensa del cuerpo (sistema inmunitario) ataca las plaquetas. Las plaquetas son glbulos sanguneos que ayudan a formar cogulos y sellar prdidas en vasos sanguneos daados. Con la PTI, hay una gran disminucin de la cantidad de plaquetas. Como consecuencia, sangra con mayor facilidad. Tambin le resulta ms difcil al cuerpo detener el sangrado. En los adultos, la PTI por lo general es una enfermedad de larga duracin. CAUSAS La causa es desconocida. La PTI puede aparecer despus de una infeccin viral, durante el embarazo o por un trastorno inmunitario. FACTORES DE RIESGO El riesgo de PTI puede ser mayor entre:  CatalinaMujeres.  Adultos entre 20 y 3250 aos. SIGNOS Y SNTOMAS DynegyEntre los signos y los sntomas ms frecuentes, se incluyen los siguientes:  Aparecen hematomas con facilidad.  Un corte que [employer] Cosangra durante mucho tiempo.  Pequeos puntos de sangre prpura (petequias) debajo de la piel, en especial en las espinillas.  Sangre en la orina o en las heces.  Hemorragias nasales.  Encas sangrantes.  Perodos menstruales abundantes. Las formas leves de PTI pueden no causar sntomas. DIAGNSTICO El mdico puede sospechar PTI segn los signos y sntomas. Para hacer un diagnstico, el mdico puede realizar un examen fsico e indicar anlisis de sangre para:  Averiguar cuntas plaquetas tiene.  Determinar la eficacia con la que coagula la [employer]re. Luego, es posible que el mdico realice anlisis de sangre o un estudio de la mdula sea para descartar otras enfermedades que podran causar los sntomas. TRATAMIENTO El tratamiento depende de la gravedad de la enfermedad. Entre las opciones se incluyen las siguientes:  Scientist, physiologicalControlar la enfermedad y el nmero de plaquetas.  Transfusiones sanguneas de anticuerpos o  plaquetas.  Medicamentos como:  Antiinflamatorios fuertes (corticoides).  Medicamentos para estimular la produccin de plaquetas.  Medicamentos para Engineer, agriculturalinhibir el sistema inmunitario.  Extirpacin del bazo. Esto se puede realizar si otros tratamientos no funcionan. INSTRUCCIONES PARA EL CUIDADO EN EL HOGAR  Tome los medicamentos solamente como se lo haya indicado el mdico.  No tome medicamentos de venta libre que disminuyan el nmero de plaquetas, afecten la funcin de las plaquetas o afecten la capacidad de coagulacin del cuerpo. Estos incluyen aspirina e ibuprofeno.  No participe en deportes de contacto u otras actividades de Edison Internationalalto riesgo. Pregntele al mdico qu actividades son seguras para usted.  Concurra a todas las visitas de control como se lo haya indicado el mdico. Esto es importante. SOLICITE ATENCIN MDICA SI:  Aparecen nuevos sntomas.  Los sntomas empeoran. SOLICITE ATENCIN MDICA DE INMEDIATO SI:  Siente un dolor de cabeza intenso y repentino o confusin.  Tiene mucho sangrado.  Tiene nuseas o vmitos.   Esta informacin no tiene Theme park managercomo fin reemplazar el consejo del mdico. Asegrese de hacerle al mdico cualquier pregunta que tenga.   Document Released: 07/08/2014 Elsevier Interactive Patient Education [employer]2016 Elsevier Inc.

## 2016-03-01 NOTE — Progress Notes (Signed)
John Brennan   DOB:1986-03-24   WU#:[id number]    Subjective: He is receiving second dose of IVIG. Admitted with low platelets yesterday. The patient denies any recent signs or symptoms of bleeding such as spontaneous epistaxis, hematuria or hematochezia.   Objective:  Filed Vitals:   03/01/16 1307 03/01/16 1348  BP: 109/63 110/65  Pulse: 68 67  Temp: 97.9 F (36.6 C) 98.1 F (36.7 C)  Resp: 17 16     Intake/Output Summary (Last 24 hours) at 03/01/16 1734 Last data filed at 03/01/16 0930  Gross per 24 hour  Intake    240 ml  Output      0 ml  Net    240 ml    GENERAL:alert, no distress and comfortable SKIN: skin color, texture, turgor are normal, no rashes or significant lesions NEURO: alert & oriented x 3 with fluent speech, no focal motor/sensory deficits   Labs:  Lab Results  Component Value Date   WBC 7.8 03/01/2016   HGB 12.9* 03/01/2016   HCT 40.0 03/01/2016   MCV 82.0 03/01/2016   PLT 68* 03/01/2016   NEUTROABS 5.2 03/01/2016    Lab Results  Component Value Date   NA 138 03/01/2016   K 3.8 03/01/2016   CL 106 03/01/2016   CO2 25 03/01/2016    Assessment & Plan:  Recurrent ITP  He has multiple recurrent admissions over the last 3 months Via the interpreter, I recommended empiric treatment of IVIG in my office every 10 days, next scheduled on 03/10/16. He will continue Imuran I will schedule appt to see him on 3/14 He can be discharged today   [facility] - LongviewGORSUCH, John Degrace, MD 03/01/2016  5:34 PM

## 2016-03-01 NOTE — Discharge Summary (Signed)
Physician Discharge Summary  John Brennan ZOX:[id number] DOB: 05-Jul-1986 DOA: 02/29/2016  PCP: No PCP Per Patient  Admit date: 02/29/2016 Discharge date: 03/01/2016  Recommendations for Outpatient Follow-up:  1. Pt will need to follow up with PCP in 1-2 weeks post discharge 2. 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 Patient's platelet count responded appropriately at that time. He was seen at the hematology clinic today and Plt noted to be 12 K and pt was referred for an admission. Patient denies any pain. No bleeding from any site. He denies any headaches, shortness of breath. No nausea, vomiting. No falls or injuries recently. No joint pains.  In the emergency department, his platelet counts were 12K. [facility] asked to admit for further evaluation.   Assessment and Plan: Active Problems:  ITP (idiopathic thrombocytopenic purpura)  - improved with IVIG treatment  - no signs of bleeding  - outpatient follow up  SCD's for DV prophylaxis   Discharge Exam: Filed Vitals:   03/01/16 0020 03/01/16 0628  BP: 131/80 108/66  Pulse: 63 60  Temp: 97.8 F (36.6 C) 97.8 F (36.6 C)  Resp: 17 17   Filed Vitals:   02/29/16 2244 02/29/16 2311 03/01/16 0020 03/01/16 0628  BP: 126/72 121/76 131/80 108/66  Pulse: 76 70 63 60  Temp:   97.8 F (36.6 C) 97.8 F (36.6 C)  TempSrc:   Oral Oral  Resp: 16 16 17 17   Height:   5\' 3"  (1.6 m)   Weight:   68.357 kg (150 lb 11.2 oz)   SpO2: 99% 100% 100% 100%    General: Pt is alert, follows commands appropriately, not in acute distress Cardiovascular: Regular rate and rhythm, S1/S2 +, no murmurs, no  rubs, no gallops Respiratory: Clear to auscultation bilaterally, no wheezing, no crackles, no rhonchi Abdominal: Soft, non tender, non distended, bowel sounds +, no guarding Extremities: no edema, no cyanosis, pulses palpable bilaterally DP and PT Neuro: Grossly nonfocal  Discharge Instructions     Medication List    TAKE these medications        Azathioprine 75 MG Tabs  Take 1 tablet (75 mg total) by mouth daily.           Follow-up Information    [facility].   Specialty:  Hematology and Oncology   Why:  As needed   Contact information:   [address] [place] [id number] [phone]        The results of significant diagnostics from this hospitalization (including imaging, microbiology, ancillary and laboratory) are listed below for reference.     Microbiology: Recent Results (from the past 240 hour(s))  TECHNOLOGIST REVIEW     Status: None   Collection Time: 02/22/16  3:35 PM  Result Value Ref Range Status   Technologist Review Rare NRBC  Final     Labs: Basic Metabolic Panel:  Recent Labs Lab 02/29/16 1622 03/01/16 0340  NA 141 138  K 3.8 3.8  CL 103 106  CO2  --  25  GLUCOSE 111* 98  BUN 11 11  CREATININE 0.70 0.92  CALCIUM  --  9.0   CBC:  Recent Labs Lab  02/25/16 1520 02/29/16 1413 02/29/16 1622 03/01/16 0340  WBC 13.0* 10.3  --  7.3  NEUTROABS 8.3* 7.3*  --   --   HGB 16.3 15.1 16.3 13.2  HCT 48.6 45.7 48.0 39.9  MCV 80.6 79.0*  --  81.3  PLT 148 12*  --  31*   SIGNED: Time coordinating discharge: 30 minutes  MAGICK-Arnice Vanepps, MD  [facility] 03/01/2016, 10:46 AM Pager [phone]  If 7PM-7AM, please contact night-coverage [email or web address] Password [email or web address]

## 2016-03-01 NOTE — Progress Notes (Signed)
Okay per Aurther Lofterry in pharmacy to give IVIG now.

## 2016-03-01 NOTE — ED Notes (Signed)
Attempted to call report x2 to 3W. No answer on the phone.

## 2016-03-01 NOTE — Progress Notes (Signed)
IVIG  Infusion completed.tolerated well. Discharge instructions given. Patient verbalized understanding og instructions.

## 2016-03-03 ENCOUNTER — Other Ambulatory Visit: Payer: Self-pay

## 2016-03-03 ENCOUNTER — Ambulatory Visit: Payer: Self-pay | Admitting: Hematology and Oncology

## 2016-03-03 ENCOUNTER — Ambulatory Visit: Payer: Self-pay

## 2016-03-07 ENCOUNTER — Other Ambulatory Visit: Payer: Self-pay

## 2016-03-07 ENCOUNTER — Other Ambulatory Visit (HOSPITAL_BASED_OUTPATIENT_CLINIC_OR_DEPARTMENT_OTHER): Payer: Self-pay

## 2016-03-07 ENCOUNTER — Encounter: Payer: Self-pay | Admitting: Hematology and Oncology

## 2016-03-07 ENCOUNTER — Ambulatory Visit (HOSPITAL_BASED_OUTPATIENT_CLINIC_OR_DEPARTMENT_OTHER): Payer: Self-pay | Admitting: Hematology and Oncology

## 2016-03-07 VITALS — BP 146/77 | HR 92 | Temp 98.4°F | Resp 18 | Ht 63.0 in | Wt 154.0 lb

## 2016-03-07 DIAGNOSIS — D693 Immune thrombocytopenic purpura: Secondary | ICD-10-CM

## 2016-03-07 LAB — COMPREHENSIVE METABOLIC PANEL
ALT: 41 U/L (ref 0–55)
ANION GAP: 5 meq/L (ref 3–11)
AST: 33 U/L (ref 5–34)
Albumin: 3.8 g/dL (ref 3.5–5.0)
Alkaline Phosphatase: 60 U/L (ref 40–150)
BUN: 10.7 mg/dL (ref 7.0–26.0)
CHLORIDE: 104 meq/L (ref 98–109)
CO2: 28 meq/L (ref 22–29)
CREATININE: 1 mg/dL (ref 0.7–1.3)
Calcium: 9.5 mg/dL (ref 8.4–10.4)
EGFR: >90 mL/min/{1.73_m2} (ref >90)
GLUCOSE: 118 mg/dL (ref 70–140)
Potassium: 4.2 mEq/L (ref 3.5–5.1)
SODIUM: 137 meq/L (ref 136–145)
TOTAL PROTEIN: 10.1 g/dL — AB (ref 6.4–8.3)
Total Bilirubin: 0.48 mg/dL (ref 0.20–1.20)

## 2016-03-07 LAB — CBC WITH DIFFERENTIAL/PLATELET
BASO%: 0.6 % (ref 0.0–2.0)
Basophils Absolute: 0.1 10*3/uL (ref 0.0–0.1)
EOS%: 0.7 % (ref 0.0–7.0)
Eosinophils Absolute: 0.1 10*3/uL (ref 0.0–0.5)
HCT: 43.4 % (ref 38.4–49.9)
HGB: 14.6 g/dL (ref 13.0–17.1)
LYMPH%: 28.1 % (ref 14.0–49.0)
MCH: 27.1 pg — ABNORMAL LOW (ref 27.2–33.4)
MCHC: 33.6 g/dL (ref 32.0–36.0)
MCV: 80.5 fL (ref 79.3–98.0)
MONO#: 0.5 10*3/uL (ref 0.1–0.9)
MONO%: 5.9 % (ref 0.0–14.0)
NEUT%: 64.7 % (ref 39.0–75.0)
NEUTROS ABS: 5.5 10*3/uL (ref 1.5–6.5)
NRBC: 0 % (ref 0–0)
Platelets: 7 10*3/uL — CL (ref 140–400)
RBC: 5.39 10*6/uL (ref 4.20–5.82)
RDW: 14.6 % (ref 11.0–14.6)
WBC: 8.5 10*3/uL (ref 4.0–10.3)
lymph#: 2.4 10*3/uL (ref 0.9–3.3)

## 2016-03-07 NOTE — Assessment & Plan Note (Signed)
Via an interpreter, I discussed with the patient treatment options. The patient have refractory ITP. He has elevated total protein, reflecting recent IVIG infusion He only had transient response lasting less than 7 days. He is asymptomatic today with low platelet count today. I have sent a referral to [facility] but he has yet heard from them. I had a frank discussion with the patient. Despite being on Nplate, steroids, Rituximab, IVIG and CellCept, he had multiple recurrence/relapses of ITP. We discussed potential splenectomy, cyclosporine or cytoxan. He was started on azathioprine 2 weeks ago and so far has no responses Due to lack of insurance, I am not able to get him insurance approval for Promacta He is reluctant for splenectomy For today, with low platelet count, the only treatment I can offer here is to recommend readmission for further IVIG as it has beneficial effects to help transient rise in platelet count last week Ultimately, after extensive discussion, the patient [facility] to be admitted to those facility to obtain an expedited second opinion.

## 2016-03-07 NOTE — Progress Notes (Signed)
[place] OFFICE PROGRESS NOTE  No PCP Per Patient SUMMARY OF HEMATOLOGIC HISTORY:  John Brennan was diagnosed with severe ITP since April 2016 He was recently hospitalized for similar reason of in April 2016, twice, and had received IVIG and corticosteroid therapy. Please see my initial consult note dated 03/26/2015 for further details. The patient was started on IVIG, dexamethasone and later Solu-Medrol and prednisone. He is considered steroid refractory. On 04/23/2015, he is started on CellCept 250 mg twice a day From 04/30/2015 to 04/28/2015, he was readmitted to the hospital due to bleeding. Bone marrow aspirate and biopsy performed on 04/27/2015 confirmed diagnosis of ITP. He was started also in addition to all the above, Nplate injection weekly On 05/04/2015, he received his last platelet transfusion. Starting 05/07/2015, prednisone taper was initiated. On 05/31/2015, prednisone was discontinued and the patient was placed on variable doses of CellCept along with Nplate  on 23/76/2831, CellCept was reduced to 500 mg twice a day along with weekly Nplate injection On 51/76/1607, he suffered from relapse due to recent infection. 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 He received 1 unit of platelet transfusion The patient was readmitted to the hospital from 12/26/2015 to 12/27/2015 with relapsed ITP. He responded to high-dose IVIG and steroids From 01/10/2016 to 02/04/2016, he received rituximab From 04/27/2015 2 02/25/2016, he received Nplate The patient was readmitted to the hospital from 01/17/2016 to 01/19/2016 with relapsed ITP. He responded to high-dose IVIG and steroids. Cellcept was discontinued The patient was readmitted to the  hospital from 01/24/2016 to 01/26/2016 with relapsed ITP. He responded to high-dose IVIG and steroids.  The patient was readmitted to the hospital from 02/09/2016 to 02/11/2016 with relapsed ITP. He responded to high-dose IVIG and steroids.  The patient was readmitted to the hospital from 02/19/2016 to 02/21/2016 with relapsed ITP. He responded to high-dose IVIG and steroids. He was started on Azathioprine The patient was readmitted to the hospital from 02/29/2016 to 03/01/2016 with relapsed ITP. He responded to high-dose IVIG and steroids.  INTERVAL HISTORY: History is obtained via an interpreter John Brennan 30 y.o. male returns for further follow-up. He feels well. The patient denies any recent signs or symptoms of bleeding such as spontaneous epistaxis, hematuria or hematochezia.   I have reviewed the past medical history, past surgical history, social history and family history with the patient and they are unchanged from previous note.  ALLERGIES:  has No Known Allergies.  MEDICATIONS:  Current Outpatient Prescriptions  Medication Sig Dispense Refill  . Azathioprine 75 MG TABS Take 1 tablet (75 mg total) by mouth daily. 30 each 6   No current facility-administered medications for this visit.     REVIEW OF SYSTEMS:   Constitutional: Denies fevers, chills or night sweats Eyes: Denies blurriness of vision Ears, nose, mouth, throat, and face: Denies mucositis or sore throat Respiratory: Denies cough, dyspnea or wheezes Cardiovascular: Denies palpitation, chest discomfort or lower extremity swelling Gastrointestinal:  Denies nausea, heartburn or change in bowel habits Skin: Denies abnormal skin rashes Lymphatics: Denies new lymphadenopathy or easy bruising Neurological:Denies numbness, tingling or new weaknesses Behavioral/Psych: Mood is stable, no new changes  All other systems were reviewed with the patient and are negative.  PHYSICAL EXAMINATION: ECOG PERFORMANCE STATUS: 0 -  Asymptomatic  Filed Vitals:  03/07/16 1138  BP: 146/77  Pulse: 92  Temp: 98.4 F (36.9 C)  Resp: 18   Filed Weights   03/07/16 1138  Weight: 154 lb (69.854 kg)    GENERAL:alert, no distress and comfortable SKIN: skin color, texture, turgor are normal, no rashes or significant lesions EYES: normal, Conjunctiva are pink and non-injected, sclera clear Musculoskeletal:no cyanosis of digits and no clubbing  NEURO: alert & oriented x 3 with fluent speech, no focal motor/sensory deficits  LABORATORY DATA:  I have reviewed the data as listed Results for orders placed or performed in visit on 03/07/16 (from the past 48 hour(s))  CBC with Differential/Platelet     Status: Abnormal   Collection Time: 03/07/16 11:29 AM  Result Value Ref Range   WBC 8.5 4.0 - 10.3 10e3/uL   NEUT# 5.5 1.5 - 6.5 10e3/uL   HGB 14.6 13.0 - 17.1 g/dL   HCT 43.4 38.4 - 49.9 %   Platelets 7 (LL) 140 - 400 10e3/uL   MCV 80.5 79.3 - 98.0 fL   MCH 27.1 (L) 27.2 - 33.4 pg   MCHC 33.6 32.0 - 36.0 g/dL   RBC 5.39 4.20 - 5.82 10e6/uL   RDW 14.6 11.0 - 14.6 %   lymph# 2.4 0.9 - 3.3 10e3/uL   MONO# 0.5 0.1 - 0.9 10e3/uL   Eosinophils Absolute 0.1 0.0 - 0.5 10e3/uL   Basophils Absolute 0.1 0.0 - 0.1 10e3/uL   NEUT% 64.7 39.0 - 75.0 %   LYMPH% 28.1 14.0 - 49.0 %   MONO% 5.9 0.0 - 14.0 %   EOS% 0.7 0.0 - 7.0 %   BASO% 0.6 0.0 - 2.0 %   nRBC 0 0 - 0 %  Comprehensive metabolic panel     Status: Abnormal   Collection Time: 03/07/16 11:29 AM  Result Value Ref Range   Sodium 137 136 - 145 mEq/L   Potassium 4.2 3.5 - 5.1 mEq/L   Chloride 104 98 - 109 mEq/L   CO2 28 22 - 29 mEq/L   Glucose 118 70 - 140 mg/dl    Comment: Glucose reference range is for nonfasting patients. Fasting glucose reference range is 70- 100.   BUN 10.7 7.0 - 26.0 mg/dL   Creatinine 1.0 0.7 - 1.3 mg/dL   Total Bilirubin 0.48 0.20 - 1.20 mg/dL   Alkaline Phosphatase 60 40 - 150 U/L   AST 33 5 - 34 U/L   ALT 41 0 - 55 U/L   Total Protein  10.1 (H) 6.4 - 8.3 g/dL   Albumin 3.8 3.5 - 5.0 g/dL   Calcium 9.5 8.4 - 10.4 mg/dL   Anion Gap 5 3 - 11 mEq/L   EGFR >90 >90 ml/min/1.73 m2    Comment: eGFR is calculated using the CKD-EPI Creatinine Equation (2009)    Lab Results  Component Value Date   WBC 8.5 03/07/2016   HGB 14.6 03/07/2016   HCT 43.4 03/07/2016   MCV 80.5 03/07/2016   PLT 7* 03/07/2016    ASSESSMENT & PLAN:  ITP (idiopathic thrombocytopenic purpura) Via an interpreter, I discussed with the patient treatment options. The patient have refractory ITP. He has elevated total protein, reflecting recent IVIG infusion He only had transient response lasting less than 7 days. He is asymptomatic today with low platelet count today. I have sent a referral to [facility] but he has yet heard from them. I had a frank discussion with the patient. Despite being on Nplate, steroids, Rituximab, IVIG and CellCept, he  had multiple recurrence/relapses of ITP. We discussed potential splenectomy, cyclosporine or cytoxan. He was started on azathioprine 2 weeks ago and so far has no responses Due to lack of insurance, I am not able to get him insurance approval for Promacta He is reluctant for splenectomy For today, with low platelet count, the only treatment I can offer here is to recommend readmission for further IVIG as it has beneficial effects to help transient rise in platelet count last week Ultimately, after extensive discussion, the patient will try to drive himself to [facility] or [facility] emergency room to be admitted to those facility to obtain an expedited second opinion.    All questions were answered. The patient knows to call the clinic with any problems, questions or concerns. No barriers to learning was detected.  I spent 15 minutes counseling the patient face to face. The total time spent in the appointment was 20 minutes and more than 50% was on counseling.     [facility], , MD 3/14/201712:19  PM

## 2016-03-08 ENCOUNTER — Telehealth: Payer: Self-pay | Admitting: Hematology and Oncology

## 2016-03-08 NOTE — Telephone Encounter (Signed)
Spoke with Mal[place] the financial counselor from [facility]. She has called the pt several times but no answer and no vm.

## 2016-03-10 ENCOUNTER — Ambulatory Visit: Payer: Self-pay

## 2016-03-10 ENCOUNTER — Other Ambulatory Visit: Payer: Self-pay | Admitting: Hematology and Oncology

## 2016-03-10 ENCOUNTER — Other Ambulatory Visit: Payer: Self-pay

## 2016-03-13 ENCOUNTER — Telehealth: Payer: Self-pay | Admitting: Hematology and Oncology

## 2016-03-13 ENCOUNTER — Other Ambulatory Visit: Payer: Self-pay | Admitting: Hematology and Oncology

## 2016-03-13 NOTE — Telephone Encounter (Signed)
per pof to sch pt appt per Dr Russella DarGorsuch-gave pt time & date for appt

## 2016-03-14 ENCOUNTER — Other Ambulatory Visit: Payer: Self-pay

## 2016-03-14 ENCOUNTER — Encounter (HOSPITAL_COMMUNITY): Payer: Self-pay | Admitting: Emergency Medicine

## 2016-03-14 DIAGNOSIS — K5900 Constipation, unspecified: Secondary | ICD-10-CM | POA: Insufficient documentation

## 2016-03-14 LAB — CBC
HCT: 43 % (ref 39.0–52.0)
HEMOGLOBIN: 14.3 g/dL (ref 13.0–17.0)
MCH: 27.6 pg (ref 26.0–34.0)
MCHC: 33.3 g/dL (ref 30.0–36.0)
MCV: 83 fL (ref 78.0–100.0)
PLATELETS: 1307 10*3/uL — AB (ref 150–400)
RBC: 5.18 MIL/uL (ref 4.22–5.81)
RDW: 15 % (ref 11.5–15.5)
WBC: 20.9 10*3/uL — AB (ref 4.0–10.5)

## 2016-03-14 LAB — URINALYSIS, ROUTINE W REFLEX MICROSCOPIC
Bilirubin Urine: NEGATIVE
GLUCOSE, UA: NEGATIVE mg/dL
Hgb urine dipstick: NEGATIVE
Ketones, ur: NEGATIVE mg/dL
LEUKOCYTES UA: NEGATIVE
Nitrite: NEGATIVE
PROTEIN: NEGATIVE mg/dL
SPECIFIC GRAVITY, URINE: 1.028 (ref 1.005–1.030)
pH: 6.5 (ref 5.0–8.0)

## 2016-03-14 LAB — URINE MICROSCOPIC-ADD ON: RBC / HPF: NONE SEEN RBC/hpf (ref 0–5)

## 2016-03-14 LAB — COMPREHENSIVE METABOLIC PANEL WITH GFR
ALT: 145 U/L — ABNORMAL HIGH (ref 17–63)
AST: 154 U/L — ABNORMAL HIGH (ref 15–41)
Albumin: 3.2 g/dL — ABNORMAL LOW (ref 3.5–5.0)
Alkaline Phosphatase: 67 U/L (ref 38–126)
Anion gap: 8 (ref 5–15)
BUN: 10 mg/dL (ref 6–20)
CO2: 24 mmol/L (ref 22–32)
Calcium: 9.3 mg/dL (ref 8.9–10.3)
Chloride: 98 mmol/L — ABNORMAL LOW (ref 101–111)
Creatinine, Ser: 0.94 mg/dL (ref 0.61–1.24)
GFR calc Af Amer: >60 mL/min
GFR calc non Af Amer: >60 mL/min
Glucose, Bld: 107 mg/dL — ABNORMAL HIGH (ref 65–99)
Potassium: 3.9 mmol/L (ref 3.5–5.1)
Sodium: 130 mmol/L — ABNORMAL LOW (ref 135–145)
Total Bilirubin: 0.6 mg/dL (ref 0.3–1.2)
Total Protein: 9.5 g/dL — ABNORMAL HIGH (ref 6.5–8.1)

## 2016-03-14 LAB — LIPASE, BLOOD: Lipase: 30 U/L (ref 11–51)

## 2016-03-14 NOTE — ED Notes (Signed)
Critical Plt count reported to Dr Wilkie AyeHorton. Received order to repeat lab.

## 2016-03-14 NOTE — ED Notes (Signed)
Pt. reports low abdominal pain with constipation onset last week s/p abdominal [facility] , denies fever or chills , no nausea or vomitting.

## 2016-03-14 NOTE — ED Notes (Signed)
Pt in triage awaiting lab redraw.

## 2016-03-15 ENCOUNTER — Other Ambulatory Visit: Payer: Self-pay | Admitting: *Deleted

## 2016-03-15 ENCOUNTER — Emergency Department (HOSPITAL_COMMUNITY)
Admission: EM | Admit: 2016-03-15 | Discharge: 2016-03-15 | Disposition: A | Discharge status: Home / Self Care | Payer: MEDICAID | Attending: Emergency Medicine | Admitting: Emergency Medicine

## 2016-03-15 LAB — CBC
HEMATOCRIT: 43.9 % (ref 39.0–52.0)
HEMOGLOBIN: 13.8 g/dL (ref 13.0–17.0)
MCH: 26.3 pg (ref 26.0–34.0)
MCHC: 31.4 g/dL (ref 30.0–36.0)
MCV: 83.8 fL (ref 78.0–100.0)
Platelets: 1319 10*3/uL (ref 150–400)
RBC: 5.24 MIL/uL (ref 4.22–5.81)
RDW: 14.9 % (ref 11.5–15.5)
WBC: 17.3 10*3/uL — AB (ref 4.0–10.5)

## 2016-03-15 LAB — PATHOLOGIST SMEAR REVIEW

## 2016-03-15 MED ORDER — SENNOSIDES-DOCUSATE SODIUM 8.6-50 MG PO TABS: ORAL_TABLET | ORAL | Status: DC

## 2016-03-15 MED ORDER — OXYCODONE-ACETAMINOPHEN 5-325 MG PO TABS
1.0000 | ORAL_TABLET | Freq: Once | ORAL | Status: AC
  Administered 2016-03-15: 1 via ORAL

## 2016-03-15 MED ORDER — OXYCODONE-ACETAMINOPHEN 5-325 MG PO TABS
ORAL_TABLET | ORAL | Status: AC
  Filled 2016-03-15: qty 1

## 2016-03-15 MED ORDER — GLYCERIN (LAXATIVE) 2 G RE SUPP: 1.0000 | Freq: Once | RECTAL | Status: DC

## 2016-03-15 MED ORDER — POLYETHYLENE GLYCOL 3350 17 G PO PACK: 17.0000 g | PACK | Freq: Every day | ORAL | Status: DC

## 2016-03-15 MED FILL — POLYETHYLENE GLYCOL 3350: 15 days supply | Qty: 255 | Fill #0

## 2016-03-15 NOTE — ED Notes (Signed)
Pts name called for a room no answer 

## 2016-03-17 ENCOUNTER — Ambulatory Visit: Payer: Self-pay

## 2016-03-17 ENCOUNTER — Other Ambulatory Visit: Payer: Self-pay

## 2016-03-21 ENCOUNTER — Ambulatory Visit (HOSPITAL_BASED_OUTPATIENT_CLINIC_OR_DEPARTMENT_OTHER): Payer: Self-pay | Admitting: Hematology and Oncology

## 2016-03-21 ENCOUNTER — Other Ambulatory Visit: Payer: Self-pay

## 2016-03-21 ENCOUNTER — Other Ambulatory Visit (HOSPITAL_BASED_OUTPATIENT_CLINIC_OR_DEPARTMENT_OTHER): Payer: Self-pay

## 2016-03-21 ENCOUNTER — Telehealth: Payer: Self-pay | Admitting: Hematology and Oncology

## 2016-03-21 VITALS — BP 130/74 | HR 80 | Temp 99.2°F | Resp 18 | Ht 61.0 in | Wt 145.6 lb

## 2016-03-21 DIAGNOSIS — Z9081 Acquired absence of spleen: Secondary | ICD-10-CM | POA: Insufficient documentation

## 2016-03-21 DIAGNOSIS — D693 Immune thrombocytopenic purpura: Secondary | ICD-10-CM

## 2016-03-21 LAB — CBC WITH DIFFERENTIAL/PLATELET
BASO%: 1.9 % (ref 0.0–2.0)
BASOS ABS: 0.2 10*3/uL — AB (ref 0.0–0.1)
EOS ABS: 0.4 10*3/uL (ref 0.0–0.5)
EOS%: 3.3 % (ref 0.0–7.0)
HEMATOCRIT: 41.3 % (ref 38.4–49.9)
HEMOGLOBIN: 13.3 g/dL (ref 13.0–17.1)
LYMPH#: 3.1 10*3/uL (ref 0.9–3.3)
LYMPH%: 23.1 % (ref 14.0–49.0)
MCH: 26.3 pg — ABNORMAL LOW (ref 27.2–33.4)
MCHC: 32.1 g/dL (ref 32.0–36.0)
MCV: 81.8 fL (ref 79.3–98.0)
MONO#: 1.5 10*3/uL — ABNORMAL HIGH (ref 0.1–0.9)
MONO%: 11.2 % (ref 0.0–14.0)
NEUT%: 60.5 % (ref 39.0–75.0)
NEUTROS ABS: 8.1 10*3/uL — AB (ref 1.5–6.5)
RBC: 5.05 10*6/uL (ref 4.20–5.82)
RDW: 15.9 % — AB (ref 11.0–14.6)
WBC: 13.4 10*3/uL — ABNORMAL HIGH (ref 4.0–10.3)

## 2016-03-21 NOTE — Assessment & Plan Note (Signed)
His ITP is cured. Right now, he has very high platelet count due to status post splenectomy. I am concerned about risk of thrombosis. I recommend low-dose hydroxyurea daily and I will check his platelet count on the weekly basis and see him back in one month's time. I have told him to discontinued azathioprine.

## 2016-03-21 NOTE — Assessment & Plan Note (Signed)
He has minor lower back pain secondary to surgery and recent constipation, resolved with laxatives. He has received his vaccination prior to surgery. The high platelet count is related to recent splenectomy. As above, I will put him on hydroxyurea

## 2016-03-21 NOTE — Telephone Encounter (Signed)
per pof to sch pt appt-gave pt copy of avs °

## 2016-03-21 NOTE — Progress Notes (Signed)
[facility] OFFICE PROGRESS NOTE  No PCP Per Patient SUMMARY OF HEMATOLOGIC HISTORY:  John Brennan was diagnosed with severe ITP since April 2016 He was recently hospitalized for similar reason of in April 2016, twice, and had received IVIG and corticosteroid therapy. Please see my initial consult note dated 03/26/2015 for further details. The patient was started on IVIG, dexamethasone and later Solu-Medrol and prednisone. He is considered steroid refractory. On 04/23/2015, he is started on CellCept 250 mg twice a day From 04/30/2015 to 04/28/2015, he was readmitted to the hospital due to bleeding. Bone marrow aspirate and biopsy performed on 04/27/2015 confirmed diagnosis of ITP. He was started also in addition to all the above, Nplate injection weekly On 05/04/2015, he received his last platelet transfusion. Starting 05/07/2015, prednisone taper was initiated. On 05/31/2015, prednisone was discontinued and the patient was placed on variable doses of CellCept along with Nplate  on 07/14/2015, CellCept was reduced to 500 mg twice a day along with weekly Nplate injection On 09/10/2015, he suffered from relapse due to recent infection. 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 He received 1 unit of platelet transfusion The patient was readmitted to the hospital from 12/26/2015 to 12/27/2015 with relapsed ITP. He responded to high-dose IVIG and steroids From 01/10/2016 to 02/04/2016, he received rituximab From 04/27/2015 2 02/25/2016, he received Nplate The patient was readmitted to the hospital from 01/17/2016 to 01/19/2016 with relapsed ITP. He responded to high-dose IVIG and steroids. Cellcept was discontinued The patient was readmitted to the  hospital from 01/24/2016 to 01/26/2016 with relapsed ITP. He responded to high-dose IVIG and steroids.  The patient was readmitted to the hospital from 02/09/2016 to 02/11/2016 with relapsed ITP. He responded to high-dose IVIG and steroids.  The patient was readmitted to the hospital from 02/19/2016 to 02/21/2016 with relapsed ITP. He responded to high-dose IVIG and steroids. He was started on Azathioprine The patient was readmitted to the hospital from 02/29/2016 to 03/01/2016 with relapsed ITP. He responded to high-dose IVIG and steroids.  The patient presented with low platelet count again synoptic the discharge. [facility] and underwent surgery with splenectomy on 03/10/2016. 03/21/2016, azathioprine was discontinued. He is started on hydroxyurea 500 mg daily due to very high platelet count. INTERVAL HISTORY: History via interpreter. John Brennan 30 y.o. male returns for further follow-up. He feels well. The patient denies any recent signs or symptoms of bleeding such as spontaneous epistaxis, hematuria or hematochezia. He had recent constipation after surgery, resolved with laxatives. He has minor lower back pain when he lies down.  I have reviewed the past medical history, past surgical history, social history and family history with the patient and they are unchanged from previous note.  ALLERGIES:  has No Known Allergies.  MEDICATIONS:  No current outpatient prescriptions on file.   No current facility-administered medications for this visit.     REVIEW OF SYSTEMS:   Constitutional: Denies fevers, chills or night sweats Eyes: Denies blurriness of vision Ears, nose, mouth, throat, and face: Denies mucositis or sore throat Respiratory: Denies cough, dyspnea or wheezes Cardiovascular: Denies palpitation, chest discomfort or lower extremity swelling Gastrointestinal:  Denies nausea, heartburn or change in bowel habits Skin: Denies abnormal skin  rashes Lymphatics: Denies new lymphadenopathy or easy  bruising Neurological:Denies numbness, tingling or new weaknesses Behavioral/Psych: Mood is stable, no new changes  All other systems were reviewed with the patient and are negative.  PHYSICAL EXAMINATION: ECOG PERFORMANCE STATUS: 0 - Asymptomatic  Filed Vitals:   03/21/16 1209  BP: 130/74  Pulse: 80  Temp: 99.2 F (37.3 C)  Resp: 18   Filed Weights   03/21/16 1209  Weight: 145 lb 9.6 oz (66.044 kg)    GENERAL:alert, no distress and comfortable SKIN: skin color, texture, turgor are normal, no rashes or significant lesions EYES: normal, Conjunctiva are pink and non-injected, sclera clear OROPHARYNX:no exudate, no erythema and lips, buccal mucosa, and tongue normal  NECK: supple, thyroid normal size, non-tender, without nodularity LYMPH:  no palpable lymphadenopathy in the cervical, axillary or inguinal LUNGS: clear to auscultation and percussion with normal breathing effort HEART: regular rate & rhythm and no murmurs and no lower extremity edema ABDOMEN:abdomen soft, non-tender and normal bowel sounds. It looks a bit distended. Well-healed surgical scars Musculoskeletal:no cyanosis of digits and no clubbing  NEURO: alert & oriented x 3 with fluent speech, no focal motor/sensory deficits  LABORATORY DATA:  I have reviewed the data as listed Results for orders placed or performed in visit on 03/21/16 (from the past 48 hour(s))  CBC with Differential/Platelet     Status: Abnormal   Collection Time: 03/21/16 11:55 AM  Result Value Ref Range   WBC 13.4 (H) 4.0 - 10.3 10e3/uL   NEUT# 8.1 (H) 1.5 - 6.5 10e3/uL   HGB 13.3 13.0 - 17.1 g/dL   HCT 24.2 68.3 - 41.9 %   Platelets 1,763 (H) 140 - 400 10e3/uL   MCV 81.8 79.3 - 98.0 fL   MCH 26.3 (L) 27.2 - 33.4 pg   MCHC 32.1 32.0 - 36.0 g/dL   RBC 6.22 2.97 - 9.89 10e6/uL   RDW 15.9 (H) 11.0 - 14.6 %   lymph# 3.1 0.9 - 3.3 10e3/uL   MONO# 1.5 (H) 0.1 - 0.9 10e3/uL    Eosinophils Absolute 0.4 0.0 - 0.5 10e3/uL   Basophils Absolute 0.2 (H) 0.0 - 0.1 10e3/uL   NEUT% 60.5 39.0 - 75.0 %   LYMPH% 23.1 14.0 - 49.0 %   MONO% 11.2 0.0 - 14.0 %   EOS% 3.3 0.0 - 7.0 %   BASO% 1.9 0.0 - 2.0 %    Lab Results  Component Value Date   WBC 13.4* 03/21/2016   HGB 13.3 03/21/2016   HCT 41.3 03/21/2016   MCV 81.8 03/21/2016   PLT 1,763* 03/21/2016    ASSESSMENT & PLAN:  ITP (idiopathic thrombocytopenic purpura) His ITP is cured. Right now, he has very high platelet count due to status post splenectomy. I am concerned about risk of thrombosis. I recommend low-dose hydroxyurea daily and I will check his platelet count on the weekly basis and see him back in one month's time. I have told him to discontinued azathioprine.  S/P laparoscopic splenectomy He has minor lower back pain secondary to surgery and recent constipation, resolved with laxatives. He has received his vaccination prior to surgery. The high platelet count is related to recent splenectomy. As above, I will put him on hydroxyurea     All questions were answered. The patient knows to call the clinic with any problems, questions or concerns. No barriers to learning was detected.  I spent 15 minutes counseling the patient face to face. The total time spent in the appointment was 20 minutes and more than 50% was on  counseling.     [facility], Audie Stayer, MD 3/28/201712:32 PM

## 2016-03-24 ENCOUNTER — Other Ambulatory Visit: Payer: Self-pay

## 2016-03-24 ENCOUNTER — Ambulatory Visit: Payer: Self-pay

## 2016-03-28 ENCOUNTER — Telehealth: Payer: Self-pay | Admitting: *Deleted

## 2016-03-28 ENCOUNTER — Other Ambulatory Visit (HOSPITAL_BASED_OUTPATIENT_CLINIC_OR_DEPARTMENT_OTHER): Payer: Self-pay

## 2016-03-28 DIAGNOSIS — D693 Immune thrombocytopenic purpura: Secondary | ICD-10-CM

## 2016-03-28 LAB — CBC WITH DIFFERENTIAL/PLATELET
BASO%: 2.2 % — AB (ref 0.0–2.0)
BASOS ABS: 0.2 10*3/uL — AB (ref 0.0–0.1)
EOS ABS: 0.3 10*3/uL (ref 0.0–0.5)
EOS%: 2.8 % (ref 0.0–7.0)
HCT: 43.6 % (ref 38.4–49.9)
HEMOGLOBIN: 14.2 g/dL (ref 13.0–17.1)
LYMPH%: 34.8 % (ref 14.0–49.0)
MCH: 26.3 pg — ABNORMAL LOW (ref 27.2–33.4)
MCHC: 32.5 g/dL (ref 32.0–36.0)
MCV: 81.1 fL (ref 79.3–98.0)
MONO#: 0.7 10*3/uL (ref 0.1–0.9)
MONO%: 7.2 % (ref 0.0–14.0)
NEUT%: 53 % (ref 39.0–75.0)
NEUTROS ABS: 5.1 10*3/uL (ref 1.5–6.5)
Platelets: 573 10*3/uL — ABNORMAL HIGH (ref 140–400)
RBC: 5.37 10*6/uL (ref 4.20–5.82)
RDW: 15.9 % — AB (ref 11.0–14.6)
WBC: 9.6 10*3/uL (ref 4.0–10.3)
lymph#: 3.3 10*3/uL (ref 0.9–3.3)

## 2016-03-28 NOTE — Telephone Encounter (Signed)
Called pt using Spanish interpreter.   Instructed pt his Platelet count is better and to stop Hydrea now.  Keep lab appt next week as scheduled.  He verbalized understanding.

## 2016-03-28 NOTE — Telephone Encounter (Signed)
-----   Message from Artis DelayNi Gorsuch, MD sent at 03/28/2016  1:33 PM EDT ----- Regarding: stop hydrea   ----- Message -----    From: Lab in [facility] Interface    Sent: 03/28/2016   1:09 PM      To: Artis DelayNi Gorsuch, MD

## 2016-04-04 ENCOUNTER — Telehealth: Payer: Self-pay | Admitting: *Deleted

## 2016-04-04 ENCOUNTER — Other Ambulatory Visit (HOSPITAL_BASED_OUTPATIENT_CLINIC_OR_DEPARTMENT_OTHER): Payer: Self-pay

## 2016-04-04 DIAGNOSIS — D693 Immune thrombocytopenic purpura: Secondary | ICD-10-CM

## 2016-04-04 LAB — CBC WITH DIFFERENTIAL/PLATELET
BASO%: 2.1 % — AB (ref 0.0–2.0)
Basophils Absolute: 0.2 10*3/uL — ABNORMAL HIGH (ref 0.0–0.1)
EOS ABS: 0.3 10*3/uL (ref 0.0–0.5)
EOS%: 3.2 % (ref 0.0–7.0)
HEMATOCRIT: 44.9 % (ref 38.4–49.9)
HEMOGLOBIN: 14.5 g/dL (ref 13.0–17.1)
LYMPH#: 3.5 10*3/uL — AB (ref 0.9–3.3)
LYMPH%: 35.9 % (ref 14.0–49.0)
MCH: 26.5 pg — ABNORMAL LOW (ref 27.2–33.4)
MCHC: 32.4 g/dL (ref 32.0–36.0)
MCV: 81.7 fL (ref 79.3–98.0)
MONO#: 0.8 10*3/uL (ref 0.1–0.9)
MONO%: 8.3 % (ref 0.0–14.0)
NEUT%: 50.5 % (ref 39.0–75.0)
NEUTROS ABS: 4.9 10*3/uL (ref 1.5–6.5)
PLATELETS: 371 10*3/uL (ref 140–400)
RBC: 5.49 10*6/uL (ref 4.20–5.82)
RDW: 16.4 % — ABNORMAL HIGH (ref 11.0–14.6)
WBC: 9.7 10*3/uL (ref 4.0–10.3)

## 2016-04-04 LAB — COMPREHENSIVE METABOLIC PANEL
ALK PHOS: 60 U/L (ref 40–150)
ALT: 37 U/L (ref 0–55)
ANION GAP: 8 meq/L (ref 3–11)
AST: 29 U/L (ref 5–34)
Albumin: 3.8 g/dL (ref 3.5–5.0)
BUN: 7.8 mg/dL (ref 7.0–26.0)
CALCIUM: 9.9 mg/dL (ref 8.4–10.4)
CO2: 27 meq/L (ref 22–29)
CREATININE: 1 mg/dL (ref 0.7–1.3)
Chloride: 107 mEq/L (ref 98–109)
EGFR: >90 mL/min/{1.73_m2} (ref >90)
Glucose: 107 mg/dl (ref 70–140)
Potassium: 4.1 mEq/L (ref 3.5–5.1)
Sodium: 142 mEq/L (ref 136–145)
TOTAL PROTEIN: 8.4 g/dL — AB (ref 6.4–8.3)

## 2016-04-04 NOTE — Telephone Encounter (Signed)
Gave pt lab results and instructed him platelets are normal and to return for lab next week as scheduled. He verbalized understanding.

## 2016-04-04 NOTE — Telephone Encounter (Signed)
-----   Message from Artis DelayNi Gorsuch, MD sent at 04/04/2016  1:19 PM EDT ----- Regarding: labs CBC normal Observation only ----- Message -----    From: [facility] in [facility] Interface    Sent: 04/04/2016   1:04 PM      To: Artis DelayNi Gorsuch, MD

## 2016-04-05 ENCOUNTER — Other Ambulatory Visit: Payer: Self-pay | Admitting: Hematology and Oncology

## 2016-04-11 ENCOUNTER — Other Ambulatory Visit (HOSPITAL_BASED_OUTPATIENT_CLINIC_OR_DEPARTMENT_OTHER): Payer: Self-pay

## 2016-04-11 ENCOUNTER — Encounter: Payer: Self-pay | Admitting: Hematology and Oncology

## 2016-04-11 ENCOUNTER — Telehealth: Payer: Self-pay | Admitting: *Deleted

## 2016-04-11 DIAGNOSIS — D693 Immune thrombocytopenic purpura: Secondary | ICD-10-CM

## 2016-04-11 LAB — CBC WITH DIFFERENTIAL/PLATELET
BASO%: 1.6 % (ref 0.0–2.0)
Basophils Absolute: 0.2 10*3/uL — ABNORMAL HIGH (ref 0.0–0.1)
EOS%: 3.8 % (ref 0.0–7.0)
Eosinophils Absolute: 0.4 10*3/uL (ref 0.0–0.5)
HCT: 45.8 % (ref 38.4–49.9)
HEMOGLOBIN: 14.8 g/dL (ref 13.0–17.1)
LYMPH%: 35.1 % (ref 14.0–49.0)
MCH: 26.5 pg — ABNORMAL LOW (ref 27.2–33.4)
MCHC: 32.2 g/dL (ref 32.0–36.0)
MCV: 82.1 fL (ref 79.3–98.0)
MONO#: 0.9 10*3/uL (ref 0.1–0.9)
MONO%: 8.5 % (ref 0.0–14.0)
NEUT%: 51 % (ref 39.0–75.0)
NEUTROS ABS: 5.6 10*3/uL (ref 1.5–6.5)
Platelets: 169 10*3/uL (ref 140–400)
RBC: 5.58 10*6/uL (ref 4.20–5.82)
RDW: 16.7 % — AB (ref 11.0–14.6)
WBC: 10.9 10*3/uL — AB (ref 4.0–10.3)
lymph#: 3.8 10*3/uL — ABNORMAL HIGH (ref 0.9–3.3)

## 2016-04-11 NOTE — Telephone Encounter (Signed)
Attempted to call pt to inform him of CBC normal and keep appt next week as scheduled.  Call place through Spanish Interpreter who reports no answer and no voice mail at pt's phone number.  Notified by Selena BattenKim in our lab that pt received copy of his CBC today so he is aware of his results.

## 2016-04-11 NOTE — Telephone Encounter (Signed)
-----   Message from Artis DelayNi Gorsuch, MD sent at 04/11/2016  1:20 PM EDT ----- Regarding: cbc Platelet count normal See him next week ----- Message -----    From: [facility] Interface    Sent: 04/11/2016   1:14 PM      To: Artis DelayNi Gorsuch, MD

## 2016-04-11 NOTE — Progress Notes (Signed)
Resent 3rd fax to [employer] for rituxan

## 2016-04-13 ENCOUNTER — Encounter: Payer: Self-pay | Admitting: Hematology and Oncology

## 2016-04-13 NOTE — Progress Notes (Signed)
Per [employer] rituxan asst approved for 1 year.

## 2016-04-18 ENCOUNTER — Encounter: Payer: Self-pay | Admitting: Hematology and Oncology

## 2016-04-18 ENCOUNTER — Ambulatory Visit (HOSPITAL_BASED_OUTPATIENT_CLINIC_OR_DEPARTMENT_OTHER): Payer: Self-pay | Admitting: Hematology and Oncology

## 2016-04-18 ENCOUNTER — Other Ambulatory Visit (HOSPITAL_BASED_OUTPATIENT_CLINIC_OR_DEPARTMENT_OTHER): Payer: Self-pay

## 2016-04-18 VITALS — BP 131/78 | HR 84 | Temp 98.7°F | Resp 18 | Ht 61.0 in | Wt 151.0 lb

## 2016-04-18 DIAGNOSIS — Z9081 Acquired absence of spleen: Secondary | ICD-10-CM

## 2016-04-18 DIAGNOSIS — D693 Immune thrombocytopenic purpura: Secondary | ICD-10-CM

## 2016-04-18 DIAGNOSIS — L708 Other acne: Secondary | ICD-10-CM

## 2016-04-18 DIAGNOSIS — L709 Acne, unspecified: Secondary | ICD-10-CM

## 2016-04-18 LAB — CBC WITH DIFFERENTIAL/PLATELET
BASO%: 1.1 % (ref 0.0–2.0)
Basophils Absolute: 0.1 10*3/uL (ref 0.0–0.1)
EOS%: 4.7 % (ref 0.0–7.0)
Eosinophils Absolute: 0.4 10*3/uL (ref 0.0–0.5)
HCT: 49 % (ref 38.4–49.9)
HGB: 16.1 g/dL (ref 13.0–17.1)
LYMPH#: 3.9 10*3/uL — AB (ref 0.9–3.3)
LYMPH%: 42.5 % (ref 14.0–49.0)
MCH: 27.4 pg (ref 27.2–33.4)
MCHC: 32.9 g/dL (ref 32.0–36.0)
MCV: 83.5 fL (ref 79.3–98.0)
MONO#: 0.7 10*3/uL (ref 0.1–0.9)
MONO%: 7.3 % (ref 0.0–14.0)
NEUT#: 4.1 10*3/uL (ref 1.5–6.5)
NEUT%: 44.4 % (ref 39.0–75.0)
Platelets: 194 10*3/uL (ref 140–400)
RBC: 5.87 10*6/uL — AB (ref 4.20–5.82)
RDW: 14.8 % — ABNORMAL HIGH (ref 11.0–14.6)
WBC: 9.2 10*3/uL (ref 4.0–10.3)
nRBC: 0 % (ref 0–0)

## 2016-04-18 NOTE — Progress Notes (Signed)
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 Please see my initial consult note dated 03/26/2015 for further details. The patient was started on IVIG, dexamethasone and later Solu-Medrol and prednisone. He is considered steroid refractory. On 04/23/2015, he is started on CellCept 250 mg twice a day From 04/30/2015 to 04/28/2015, he was readmitted to the hospital due to bleeding. Bone marrow aspirate and biopsy performed on 04/27/2015 confirmed diagnosis of ITP. He was started also in addition to all the above, Nplate injection weekly On 05/04/2015, he received his last platelet transfusion. Starting 05/07/2015, prednisone taper was initiated. On 05/31/2015, prednisone was discontinued and the patient was placed on variable doses of CellCept along with Nplate  on 07/14/2015, CellCept was reduced to 500 mg twice a day along with weekly Nplate injection On 09/10/2015, he suffered from relapse due to recent infection. 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 He received 1 unit of platelet transfusion The patient was readmitted to the hospital from 12/26/2015 to 12/27/2015 with relapsed ITP. He responded to high-dose IVIG and steroids From 01/10/2016 to 02/04/2016, he received rituximab From 04/27/2015 2 02/25/2016, he received Nplate The patient was readmitted to the hospital from 01/17/2016 to 01/19/2016 with relapsed ITP. He responded to high-dose IVIG and steroids. Cellcept was discontinued The patient was readmitted to the  hospital from 01/24/2016 to 01/26/2016 with relapsed ITP. He responded to high-dose IVIG and steroids.  The patient was readmitted to the hospital from 02/09/2016 to 02/11/2016 with relapsed ITP. He responded to high-dose IVIG and steroids.  The patient was readmitted to the hospital from 02/19/2016 to 02/21/2016 with relapsed ITP. He responded to high-dose IVIG and steroids. He was started on Azathioprine The patient was readmitted to the hospital from 02/29/2016 to 03/01/2016 with relapsed ITP. He responded to high-dose IVIG and steroids.  The patient presented with low platelet count again synoptic the discharge. He subsequently went to [facility] emergency room and underwent surgery with splenectomy on 03/10/2016. 03/21/2016, azathioprine was discontinued. He is started on hydroxyurea 500 mg daily due to very high platelet count, subsequently discontinued  INTERVAL HISTORY: Satoru Zeiss 30 y.o. male returns for further follow-up. He has interpreter with him today. His only main complaint today is acne on his face. The patient denies any recent signs or symptoms of bleeding such as spontaneous epistaxis, hematuria or hematochezia. He denies further abdominal pain. No recent infection.  I have reviewed the past medical history, past surgical history, social history and family history with the patient and they are unchanged from previous note.  ALLERGIES:  has No Known Allergies.  MEDICATIONS:  No current outpatient prescriptions on file.   No current facility-administered medications for this visit.     REVIEW OF SYSTEMS:   Constitutional: Denies fevers, chills or night sweats Eyes: Denies blurriness of vision Ears, nose, mouth, throat, and face: Denies mucositis or sore throat Respiratory: Denies cough, dyspnea or wheezes Cardiovascular: Denies palpitation, chest discomfort or lower extremity swelling Gastrointestinal:  Denies nausea, heartburn or change in bowel habits Lymphatics:  Denies new lymphadenopathy or easy bruising Neurological:Denies numbness,  tingling or new weaknesses Behavioral/Psych: Mood is stable, no new changes  All other systems were reviewed with the patient and are negative.  PHYSICAL EXAMINATION: ECOG PERFORMANCE STATUS: 0 - Asymptomatic  Filed Vitals:   04/18/16 1013  BP: 131/78  Pulse: 84  Temp: 98.7 F (37.1 C)  Resp: 18   Filed Weights   04/18/16 1013  Weight: 151 lb (68.493 kg)    GENERAL:alert, no distress and comfortable SKIN: Noted acne on his face EYES: normal, Conjunctiva are pink and non-injected, sclera clear OROPHARYNX:no exudate, no erythema and lips, buccal mucosa, and tongue normal  Musculoskeletal:no cyanosis of digits and no clubbing  NEURO: alert & oriented x 3 with fluent speech, no focal motor/sensory deficits  LABORATORY DATA:  I have reviewed the data as listed     Component Value Date/Time   NA 142 04/04/2016 1254   NA 130* 03/14/2016 2245   K 4.1 04/04/2016 1254   K 3.9 03/14/2016 2245   CL 98* 03/14/2016 2245   CO2 27 04/04/2016 1254   CO2 24 03/14/2016 2245   GLUCOSE 107 04/04/2016 1254   GLUCOSE 107* 03/14/2016 2245   BUN 7.8 04/04/2016 1254   BUN 10 03/14/2016 2245   CREATININE 1.0 04/04/2016 1254   CREATININE 0.94 03/14/2016 2245   CALCIUM 9.9 04/04/2016 1254   CALCIUM 9.3 03/14/2016 2245   PROT 8.4* 04/04/2016 1254   PROT 9.5* 03/14/2016 2245   ALBUMIN 3.8 04/04/2016 1254   ALBUMIN 3.2* 03/14/2016 2245   AST 29 04/04/2016 1254   AST 154* 03/14/2016 2245   ALT 37 04/04/2016 1254   ALT 145* 03/14/2016 2245   ALKPHOS 60 04/04/2016 1254   ALKPHOS 67 03/14/2016 2245   BILITOT <0.30 04/04/2016 1254   BILITOT 0.6 03/14/2016 2245   GFRNONAA >60 03/14/2016 2245   GFRAA >60 03/14/2016 2245    No results found for: SPEP, UPEP  Lab Results  Component Value Date   WBC 9.2 04/18/2016   NEUTROABS 4.1 04/18/2016   HGB 16.1 04/18/2016   HCT 49.0 04/18/2016   MCV 83.5 04/18/2016   PLT  194 04/18/2016      Chemistry      Component Value Date/Time   NA 142 04/04/2016 1254   NA 130* 03/14/2016 2245   K 4.1 04/04/2016 1254   K 3.9 03/14/2016 2245   CL 98* 03/14/2016 2245   CO2 27 04/04/2016 1254   CO2 24 03/14/2016 2245   BUN 7.8 04/04/2016 1254   BUN 10 03/14/2016 2245   CREATININE 1.0 04/04/2016 1254   CREATININE 0.94 03/14/2016 2245      Component Value Date/Time   CALCIUM 9.9 04/04/2016 1254   CALCIUM 9.3 03/14/2016 2245   ALKPHOS 60 04/04/2016 1254   ALKPHOS 67 03/14/2016 2245   AST 29 04/04/2016 1254   AST 154* 03/14/2016 2245   ALT 37 04/04/2016 1254   ALT 145* 03/14/2016 2245   BILITOT <0.30 04/04/2016 1254   BILITOT 0.6 03/14/2016 2245     ASSESSMENT & PLAN:  ITP (idiopathic thrombocytopenic purpura) The patient has no further relapse of ITP since splenectomy. I will discharge him from the clinic  Acne I recommend improve skin care with over-the-counter acne products. He does not need antibiotic therapy  S/P laparoscopic splenectomy He has received his vaccination prior to surgery. The patient will need annual influenza vaccination and booster pneumococcal vaccination next year. I recommend he get established with a primary care doctor to receive his vaccination.   All questions  were answered. The patient knows to call the clinic with any problems, questions or concerns. No barriers to learning was detected.  I spent 15 minutes counseling the patient face to face. The total time spent in the appointment was 20 minutes and more than 50% was on counseling.     Jaxon Mynhier, MD 4/25/201710:29 AM

## 2016-04-18 NOTE — Assessment & Plan Note (Signed)
He has received his vaccination prior to surgery. The patient will need annual influenza vaccination and booster pneumococcal vaccination next year. I recommend he get established with a primary care doctor to receive his vaccination.

## 2016-04-18 NOTE — Assessment & Plan Note (Signed)
The patient has no further relapse of ITP since splenectomy. I will discharge him from the clinic

## 2016-04-18 NOTE — Assessment & Plan Note (Signed)
I recommend improve skin care with over-the-counter acne products. He does not need antibiotic therapy

## 2016-05-11 ENCOUNTER — Encounter: Payer: Self-pay | Admitting: Hematology and Oncology

## 2016-05-11 NOTE — Progress Notes (Signed)
I called and let diego at [employer]. Patient no longer on rituxan-- they will close case

## 2016-05-12 ENCOUNTER — Telehealth: Payer: Self-pay | Admitting: *Deleted

## 2016-05-12 NOTE — Telephone Encounter (Signed)
Faxed completed form to [facility] [facility]. (Free Rituxan program)

## 2016-06-01 ENCOUNTER — Encounter: Payer: Self-pay | Admitting: Hematology and Oncology

## 2016-06-01 NOTE — Progress Notes (Signed)
Advised [employer] again. Patient no longer on rixtuxan

## 2017-05-14 ENCOUNTER — Encounter: Payer: Self-pay | Admitting: Emergency Medicine

## 2017-05-14 ENCOUNTER — Ambulatory Visit (INDEPENDENT_AMBULATORY_CARE_PROVIDER_SITE_OTHER): Payer: Self-pay | Admitting: Emergency Medicine

## 2017-05-14 ENCOUNTER — Ambulatory Visit (INDEPENDENT_AMBULATORY_CARE_PROVIDER_SITE_OTHER): Payer: Self-pay

## 2017-05-14 VITALS — BP 119/69 | HR 71 | Temp 98.5°F | Resp 17 | Ht 65.0 in | Wt 142.0 lb

## 2017-05-14 DIAGNOSIS — S6992XA Unspecified injury of left wrist, hand and finger(s), initial encounter: Secondary | ICD-10-CM

## 2017-05-14 DIAGNOSIS — S62015A Nondisplaced fracture of distal pole of navicular [scaphoid] bone of left wrist, initial encounter for closed fracture: Secondary | ICD-10-CM

## 2017-05-14 NOTE — Patient Instructions (Addendum)
IF you received an x-ray today, you will receive an invoice from [facility]. Please contact [facility] at [phone] with questions or concerns regarding your invoice.   IF you received labwork today, you will receive an invoice from [facility]. Please contact [facility] at [phone] with questions or concerns regarding your invoice.   Our billing staff will not be able to assist you with questions regarding bills from these companies.  You will be contacted with the lab results as soon as they are available. The fastest way to get your results is to activate your My Chart account. Instructions are located on the last page of this paperwork. If you have not heard from [place] regarding the results in 2 weeks, please contact this office.      Fractura de escafoides (Scaphoid Fracture) La fractura de escafoides es la ruptura de uno de los huesos pequeos de la [place]. El hueso escafoides se encuentra del lado del pulgar. Este [employer] otros siete huesos que forman la [place]. El hueso escafoides tiene poca irrigacin de [place], por lo que puede demorar bastante [employer]. Tal vez deba usar un yeso o una frula durante algunos meses. CAUSAS Por lo general, esta lesin se produce al caerse Intel mano y el brazo extendidos. Este tipo de lesin tambin puede ocurrir al estar en un accidente automovilstico y apoyar la mano para protegerse. FACTORES DE RIESGO Los siguientes factores pueden hacer que usted sea propenso a sufrir estas lesiones:  Education administrator deportes de contacto.  Esquiar, andar en patineta o patinar. SNTOMAS Los sntomas de esta lesin incluyen lo siguiente:  Dolor, especialmente al agarrar algo o apretar con el pulgar.  Dolor al presionar Intel base del pulgar, especialmente en la parte hueca de esta zona, cuando el pulgar est extendido.  Hinchazn.  Hematomas. DIAGNSTICO Esta lesin se puede diagnosticar en funcin de lo  siguiente:  Los antecedentes de la lesin.  Un examen fsico de la mueca y Multimedia programmer.  Radiografas.  Tomografa computarizada o resonancia magntica. Estos estudios a veces son necesarios, porque este tipo de Surveyor, minerals quizs no se vea con las radiografas. La fractura de escafoides puede ser difcil de diagnosticar, ya que es posible que el dolor no comience hasta despus de [address] [place], la fractura no produce una deformidad y puede no limitar el movimiento. TRATAMIENTO El tratamiento depende de la ubicacin de la fractura y de si el hueso se ha salido de Environmental consultant (desplazado). El tratamiento puede ser quirrgico o no quirrgico.  Es posible que necesite usar un yeso o una frula desde la mitad del antebrazo hasta la [place]. Probablemente el pulgar quedar adentro del yeso o frula para mantenerlo extendido.  Mientras la fractura se [place], puede tratarse con ondas sonoras o energa elctrica para estimular su consolidacin.  Una fractura desplazada puede requerir ciruga para volver a Scientific laboratory technician las partes del hueso en la posicin correspondiente. Pueden utilizarse alambres o tornillos para Engineering geologist.  Cuando le saquen el yeso o la frula, quizs tenga que hacer ejercicios (fisioterapia) para Armed forces training and education officer movimiento de la [place]. INSTRUCCIONES PARA EL CUIDADO EN EL HOGAR Si tiene un yeso:  No introduzca nada adentro del yeso para rascarse la piel. Esto puede aumentar el riesgo de tener infecciones.  Controle todos los [employer] piel de alrededor del yeso. Informe al mdico cualquier inquietud que tenga. Puede aplicar una locin en la piel seca alrededor de los bordes del yeso. No aplique  locin en la piel por debajo del yeso.  No deje que su yeso se moje si no es impermeable.  Mantenga el yeso limpio. Si tiene una frula:  Use la frula como se lo haya indicado el mdico. Qutesela solamente como se lo haya indicado el mdico.  Afloje la frula si los dedos  se le entumecen, siente hormigueos o se le enfran y se tornan de Research officer, trade unioncolor azul.  No deje que su frula se moje si no es impermeable.  Mantenga la frula limpia. El bao  No tome baos de inmersin, no nade ni use el jacuzzi hasta que el mdico lo autorice. Pregntele al mdico si puede ducharse. Delle Reiningal vez solo le permitan tomar baos de Valieresponja.  Si el yeso o la frula no son impermeables, cbralos con una bolsa de plstico hermtica mientras toma un bao de inmersin o una ducha. Control del dolor, la rigidez y la hinchazn  Si se lo indican, aplique hielo sobre la zona lesionada.  Ponga el hielo en una bolsa plstica.  Coloque una toalla entre la piel y la bolsa de hielo.  Coloque el hielo durante 20minutos, 2 a 3veces por Futures traderda.  Mueva los dedos con frecuencia para evitar la rigidez y reducir la hinchazn.  Cuando est sentado o acostado, eleve la zona de la lesin por encima del nivel del corazn. Conducir  No conduzca ni opere maquinaria pesada mientras toma analgsicos recetados.  Pregntele a su mdico cundo Copypuede volver a conducir vehculos si tiene un yeso o una frula en la [place]. Actividad  Reanude sus actividades normales como se lo haya indicado el mdico. Pregntele al mdico qu actividades son seguras para usted. Es posible que deba limitar los deportes de contacto y las actividades que impliquen arrojar objetos, Quarry managerempujar, trepar y usar mquinas vibratorias.  No levante objetos que pesen ms de 1libra (0,5kg) con la mano afectada hasta que el mdico le diga que es seguro Parker Schoolhacerlo.  Haga ejercicios solamente como se lo haya indicado el mdico. Instrucciones generales  No ejerza presin en ninguna parte del yeso o de la frula hasta que se hayan endurecido. Esto puede tardar varias horas.  Tome los medicamentos de venta libre y los recetados solamente como se lo haya indicado el mdico.  No consuma ningn producto que contenga tabaco, lo que incluye cigarrillos,  tabaco de Theatre managermascar o Administrator, Civil Servicecigarrillos electrnicos. El tabaco puede retrasar el proceso de curacin. Si necesita ayuda para dejar de fumar, consulte al mdico.  Concurra a todas las visitas de control como se lo haya indicado el mdico. Esto es importante. SOLICITE ATENCIN MDICA SI:  El dolor o la hinchazn empeoran aunque haya recibido Leroytratamiento.  Tiene adormecimiento u hormigueos en la mano o en los dedos, o los siente fros.  El yeso o la frula se afloja o se daa. SOLICITE ATENCIN MDICA DE INMEDIATO SI:  Pierde la sensibilidad en la mano o en los dedos.  Los dedos o las uas se tornan plidos o Poloniaazulados. Esta informacin no tiene Theme park managercomo fin reemplazar el consejo del mdico. Asegrese de hacerle al mdico cualquier pregunta que tenga. Document Released: 03/29/2009 Document Revised: 04/03/2016 Document Reviewed: 06/23/2015 Elsevier Interactive Patient Education  2017 [employer]

## 2017-05-14 NOTE — Progress Notes (Signed)
Saw John Brennan 31 y.o.   Chief Complaint  Patient presents with  . Wrist Pain    HISTORY OF PRESENT ILLNESS: This is a 31 y.o. male complaining of left wrist injury sustained 2 weeks ago; c/o pain and swelling.  Wrist Pain   The pain is present in the left wrist. This is a new problem. The current episode started 1 to 4 weeks ago. There has been a history of trauma. The problem occurs constantly. The problem has been waxing and waning. The quality of the pain is described as aching. The pain is at a severity of 3/10. The pain is mild. Associated symptoms include joint swelling and a limited range of motion. Pertinent negatives include no fever, numbness or tingling. The symptoms are aggravated by activity and contact. He has tried nothing for the symptoms.     Prior to Admission medications   Not on File    No Known Allergies  Patient Active Problem List   Diagnosis Date Noted  . 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 Leukocytosis 02/20/2016  . Abnormal liver function test   . GERD (gastroesophageal reflux disease) 01/03/2016  . Acne 12/31/2015  . ITP (idiopathic thrombocytopenic purpura) 04/13/2015  . Thrombocytopenia (HCC) 03/26/2015    Past Medical History:  Diagnosis Date  . Acne 12/31/2015  . Drug-induced skin rash 07/14/2015  . Fatty liver   . Thrombocytopenia (HCC)     No past surgical history on file.  Social History   Social History  . Marital status: Single    Spouse name: N/A  . Number of children: N/A  . Years of education: N/A   Occupational History  . Construction work    Social History Main Topics  . Smoking status: Never Smoker  . Smokeless tobacco: Never Used  . Alcohol use Yes  . Drug use: No  . Sexual activity: Yes   Other Topics Concern  . Not on file   Social History Narrative  . No narrative on file    Family History  Problem Relation Age of Onset  . Diabetes Father      Review of Systems  Constitutional: Negative for  chills and fever.  Respiratory: Negative for shortness of breath.   Cardiovascular: Negative for chest pain and leg swelling.  Gastrointestinal: Negative for nausea and vomiting.  Musculoskeletal: Positive for joint pain (left wrist).  Skin: Negative for rash.  Neurological: Negative for tingling, sensory change, focal weakness and numbness.  Endo/Heme/Allergies: Negative.   All other systems reviewed and are negative.  Vitals:   05/14/17 1027  BP: 119/69  Pulse: 71  Resp: 17  Temp: 98.5 F (36.9 C)     Physical Exam  Constitutional: He is oriented to person, place, and time. He appears well-developed and well-nourished.  HENT:  Head: Normocephalic and atraumatic.  Eyes: EOM are normal. Pupils are equal, round, and reactive to light.  Neck: Normal range of motion. Neck supple.  Cardiovascular: Normal rate and regular rhythm.   Pulmonary/Chest: Effort normal and breath sounds normal.  Musculoskeletal:  Left wrist: +scaphoid tenderness; LROM; NVI Left Hand: WNL  Neurological: He is alert and oriented to person, place, and time. No sensory deficit. He exhibits normal muscle tone.  Skin: Skin is warm and dry. Capillary refill takes less than 2 seconds. No rash noted.  Psychiatric: He has a normal mood and affect. His behavior is normal.    Dg Wrist Complete Left  Result Date: 05/14/2017 CLINICAL DATA:  Wrist injury EXAM: LEFT WRIST -  COMPLETE 3+ VIEW COMPARISON:  None. FINDINGS: There is a fracture through the midpole of the left scaphoid. No additional fracture. No subluxation or dislocation. Soft tissues are intact. IMPRESSION: Nondisplaced fracture through the midpole of the left scaphoid. Electronically Signed   By: Charlett Nose M.D.   On: 05/14/2017 11:24     ASSESSMENT & PLAN: John Brennan was seen today for wrist pain.  Diagnoses and all orders for this visit:  Wrist injury, left, initial encounter -     DG Wrist Complete Left; Future  Closed nondisplaced fracture of  distal pole of scaphoid bone of left wrist, initial encounter -     Thumb spica -     Ambulatory referral to Orthopedic Surgery    Patient Instructions       IF you received an x-ray today, you will receive an invoice from [facility]. Please contact [facility] at [phone] with questions or concerns regarding your invoice.   IF you received labwork today, you will receive an invoice from [facility]. Please contact [facility] at [phone] with questions or concerns regarding your invoice.   Our billing staff will not be able to assist you with questions regarding bills from these companies.  You will be contacted with the lab results as soon as they are available. The fastest way to get your results is to activate your My Chart account. Instructions are located on the last page of this paperwork. If you have not heard from [place] regarding the results in 2 weeks, please contact this office.      Fractura de escafoides (Scaphoid Fracture) La fractura de escafoides es la ruptura de uno de los huesos pequeos de la [place]. El hueso escafoides se encuentra del lado del pulgar. Este [employer] otros siete huesos que forman la [place]. El hueso escafoides tiene poca irrigacin de [employer], por lo que puede demorar bastante [employer]. Tal vez deba usar un yeso o una frula durante algunos meses. CAUSAS Por lo general, esta lesin se produce al caerse [employer] mano y el brazo extendidos. Este tipo de lesin tambin puede ocurrir al estar en un accidente automovilstico y apoyar la mano para protegerse. FACTORES DE RIESGO Los siguientes factores pueden hacer que usted sea propenso a sufrir estas lesiones:  Education administrator deportes de contacto.  Esquiar, andar en patineta o patinar. SNTOMAS Los sntomas de esta lesin incluyen lo siguiente:  Dolor, especialmente al agarrar algo o apretar con el pulgar.  Dolor al presionar Intel base del pulgar, especialmente en  la parte hueca de esta zona, cuando el pulgar est extendido.  Hinchazn.  Hematomas. DIAGNSTICO Esta lesin se puede diagnosticar en funcin de lo siguiente:  Los antecedentes de la lesin.  Un examen fsico de la mueca y Multimedia programmer.  Radiografas.  Tomografa computarizada o resonancia magntica. Estos estudios a veces son necesarios, porque este tipo de Surveyor, minerals quizs no se vea con las radiografas. La fractura de escafoides puede ser difcil de diagnosticar, ya que es posible que el dolor no comience hasta despus de [address] [place], la fractura no produce una deformidad y puede no limitar el movimiento. TRATAMIENTO El tratamiento depende de la ubicacin de la fractura y de si el hueso se ha salido de Environmental consultant (desplazado). El tratamiento puede ser quirrgico o no quirrgico.  Es posible que necesite usar un yeso o una frula desde la mitad del antebrazo hasta la [place]. Probablemente el pulgar quedar adentro del yeso o frula para mantenerlo extendido.  Mientras la  fractura se consolida, puede tratarse con ondas sonoras o energa elctrica para estimular su consolidacin.  Una fractura desplazada puede requerir ciruga para volver a Scientific laboratory technician las partes del hueso en la posicin correspondiente. Pueden utilizarse alambres o tornillos para Engineering geologist.  Cuando le saquen el yeso o la frula, quizs tenga que hacer ejercicios (fisioterapia) para Armed forces training and education officer movimiento de la [place]. INSTRUCCIONES PARA EL CUIDADO EN EL HOGAR Si tiene un yeso:  No introduzca nada adentro del yeso para rascarse la piel. Esto puede aumentar el riesgo de tener infecciones.  Controle todos los [employer] piel de alrededor del yeso. Informe al mdico cualquier inquietud que tenga. Puede aplicar una locin en la piel seca alrededor de los bordes del yeso. No aplique locin en la piel por debajo del yeso.  No deje que su yeso se moje si no es impermeable.  Mantenga el yeso limpio. Si tiene  una frula:  Use la frula como se lo haya indicado el mdico. Qutesela solamente como se lo haya indicado el mdico.  Afloje la frula si los dedos se le entumecen, siente hormigueos o se le enfran y se tornan de Research officer, trade union.  No deje que su frula se moje si no es impermeable.  Mantenga la frula limpia. El bao  No tome baos de inmersin, no nade ni use el jacuzzi hasta que el mdico lo autorice. Pregntele al mdico si puede ducharse. Delle Reining solo le permitan tomar baos de [place].  Si el yeso o la frula no son impermeables, cbralos con una bolsa de plstico hermtica mientras toma un bao de inmersin o una ducha. Control del dolor, la rigidez y la hinchazn  Si se lo indican, aplique hielo sobre la zona lesionada.  Ponga el hielo en una bolsa plstica.  Coloque una toalla entre la piel y la bolsa de hielo.  Coloque el hielo durante , 2 a 3veces por Futures trader.  Mueva los dedos con frecuencia para evitar la rigidez y reducir la hinchazn.  Cuando est sentado o acostado, eleve la zona de la lesin por encima del nivel del corazn. Conducir  No conduzca ni opere maquinaria pesada mientras toma analgsicos recetados.  Pregntele a su mdico cundo Copy a conducir vehculos si tiene un yeso o una frula en la [place]. Actividad  Reanude sus actividades normales como se lo haya indicado el mdico. Pregntele al mdico qu actividades son seguras para usted. Es posible que deba limitar los deportes de contacto y las actividades que impliquen arrojar objetos, Quarry manager, trepar y usar mquinas vibratorias.  No levante objetos que pesen ms de 1libra (0,5kg) con la mano afectada hasta que el mdico le diga que es seguro [place].  Haga ejercicios solamente como se lo haya indicado el mdico. Instrucciones generales  No ejerza presin en ninguna parte del yeso o de la frula hasta que se hayan endurecido. Esto puede tardar varias horas.  Tome los medicamentos de  venta libre y los recetados solamente como se lo haya indicado el mdico.  No consuma ningn producto que contenga tabaco, lo que incluye cigarrillos, tabaco de Theatre manager o Administrator, Civil Service. El tabaco puede retrasar el proceso de curacin. Si necesita ayuda para dejar de fumar, consulte al mdico.  Concurra a todas las visitas de control como se lo haya indicado el mdico. Esto es importante. SOLICITE ATENCIN MDICA SI:  El dolor o la hinchazn empeoran aunque haya recibido [place].  Tiene adormecimiento u hormigueos en la mano o en los  dedos, o los siente fros.  El yeso o la frula se afloja o se daa. SOLICITE ATENCIN MDICA DE INMEDIATO SI:  Pierde la sensibilidad en la mano o en los dedos.  Los dedos o las uas se tornan plidos o [place]. Esta informacin no tiene Theme park manager el consejo del mdico. Asegrese de hacerle al mdico cualquier pregunta que tenga. Document Released: 03/29/2009 Document Revised: 04/03/2016 Document Reviewed: 06/23/2015 Elsevier Interactive Patient Education  2017 Elsevier Inc.      Edwina Barth, MD [facility]

## 2017-05-15 ENCOUNTER — Ambulatory Visit (INDEPENDENT_AMBULATORY_CARE_PROVIDER_SITE_OTHER): Payer: Self-pay | Admitting: Orthopedic Surgery

## 2017-05-18 ENCOUNTER — Ambulatory Visit (INDEPENDENT_AMBULATORY_CARE_PROVIDER_SITE_OTHER): Payer: Self-pay | Admitting: Family

## 2018-02-28 IMAGING — DX DG WRIST COMPLETE 3+V*L*
4 series · 4 of 4 positions shown · non-contrast
Comparison: None.

CLINICAL DATA: Wrist injury

EXAM:
LEFT WRIST - COMPLETE 3+ VIEW

[wrist pa]
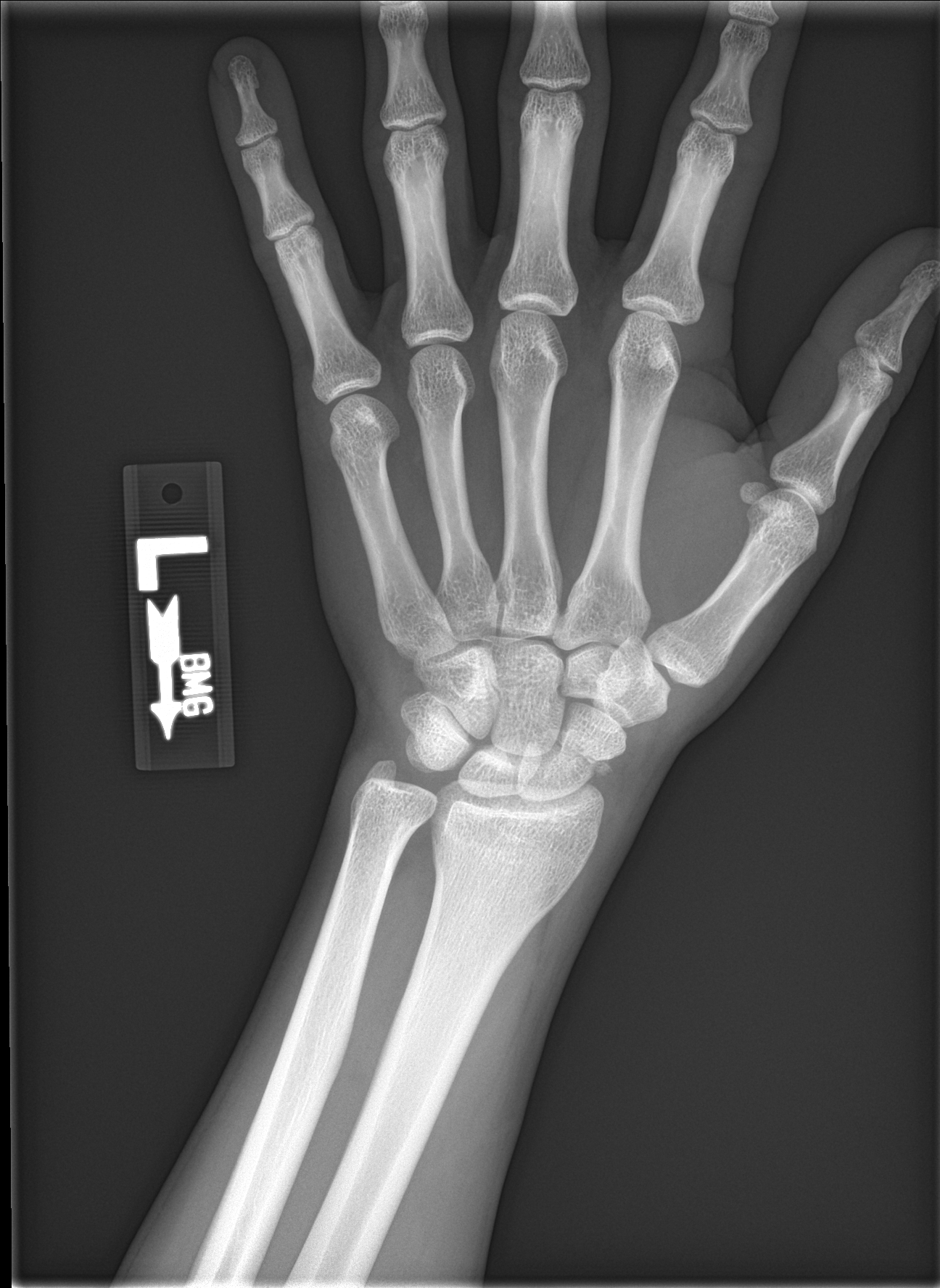

[wrist obl]
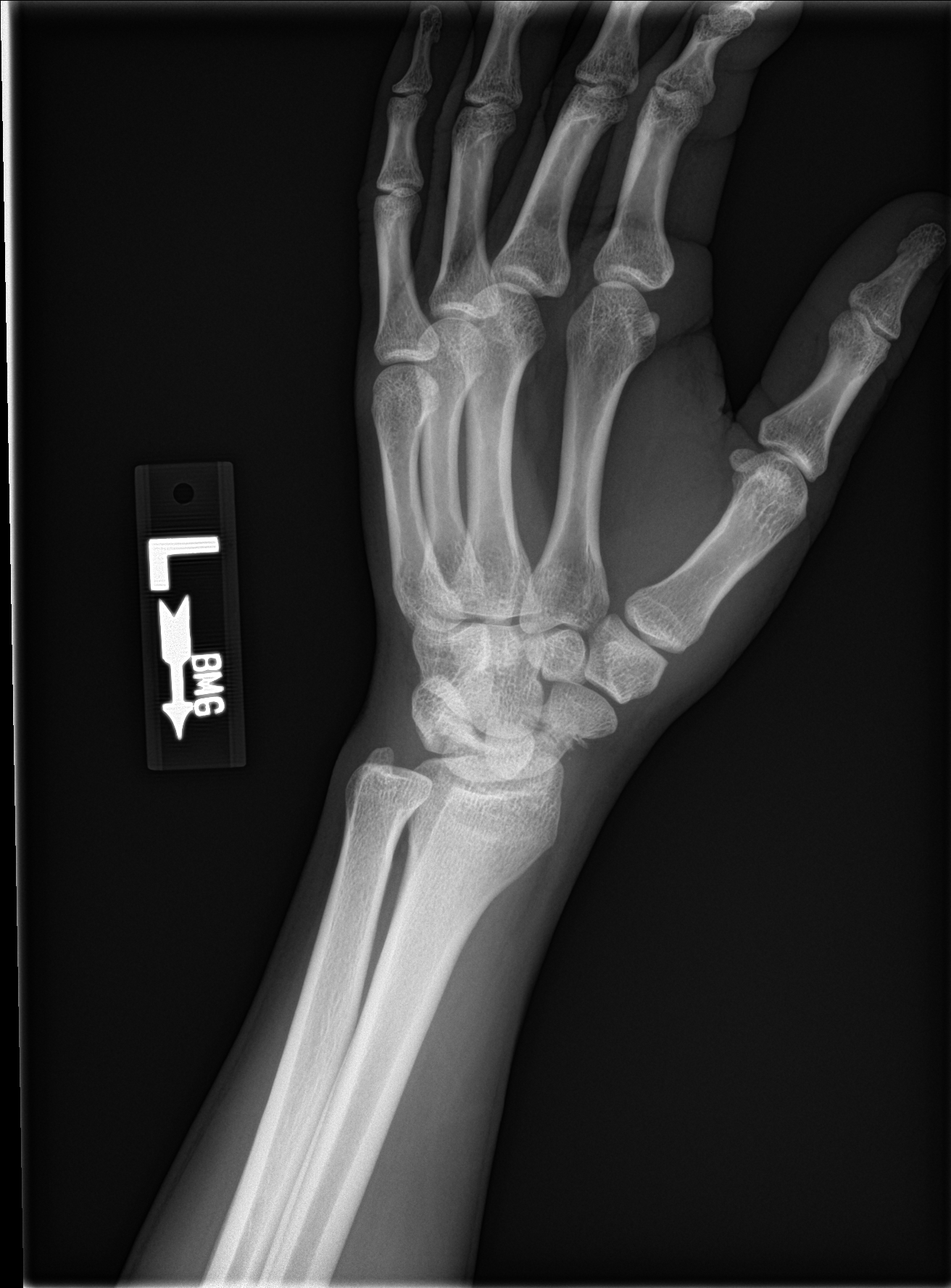

[wrist lat]
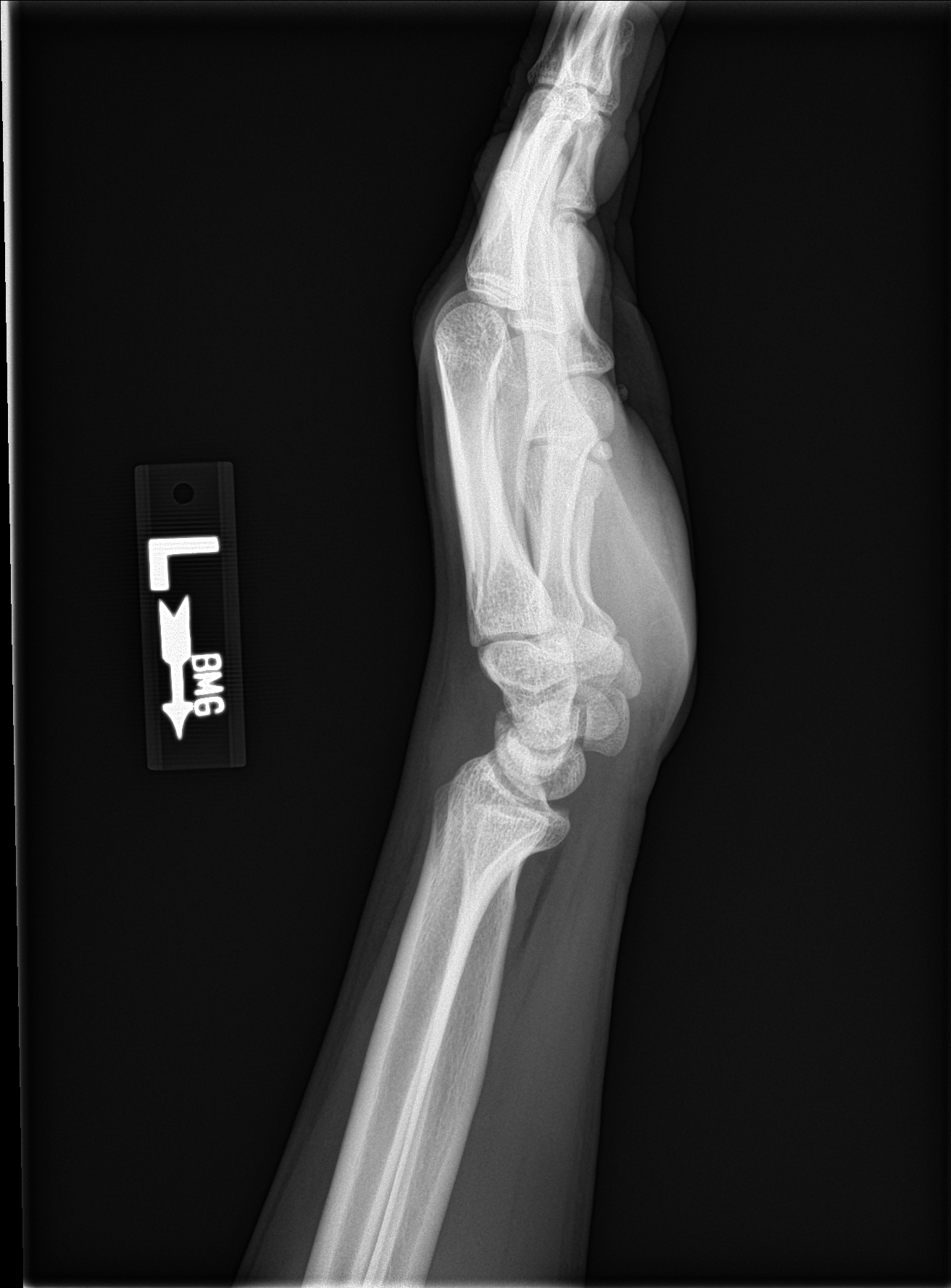

[wrist navicular]
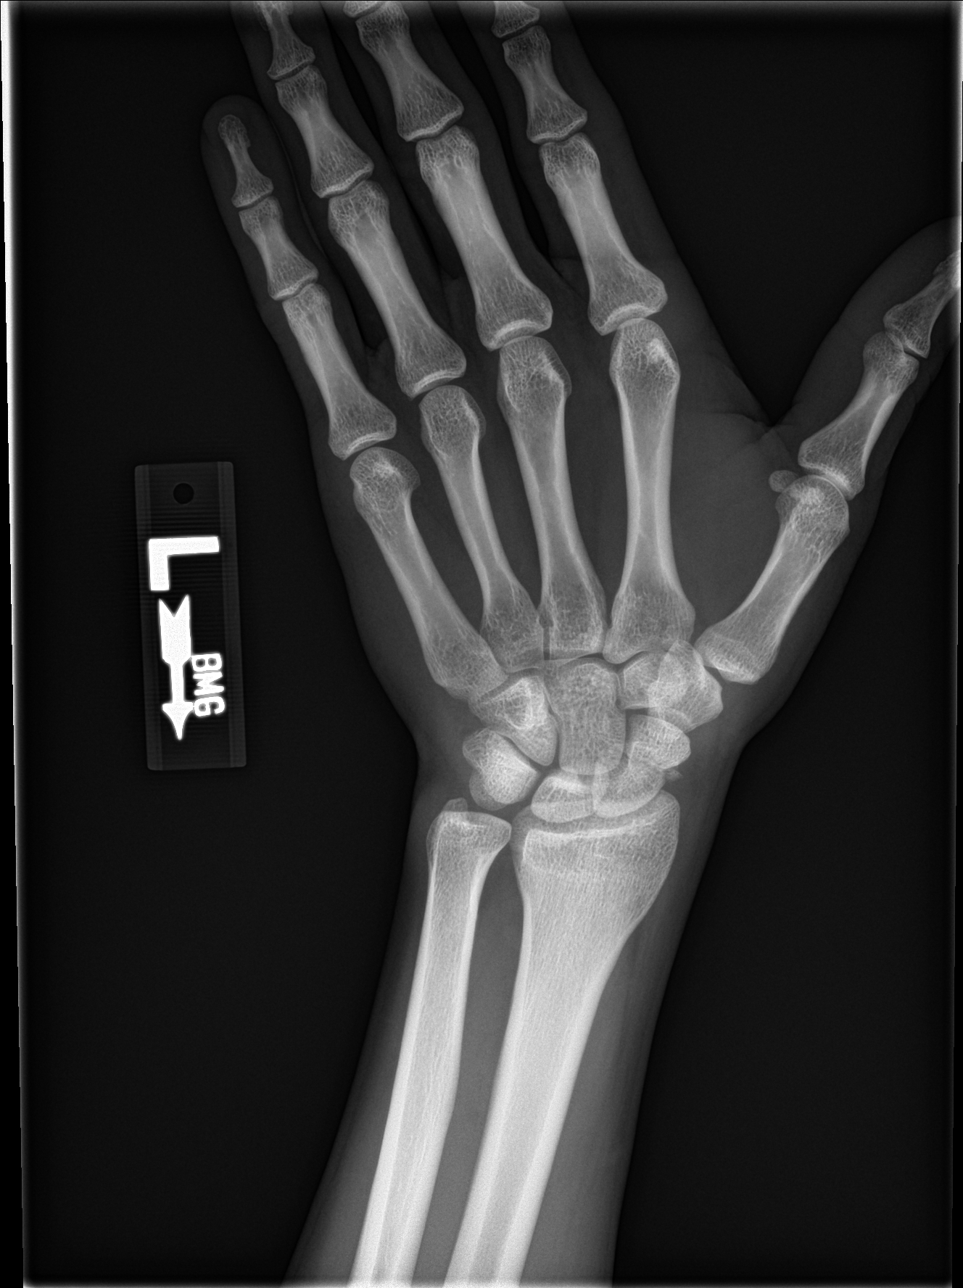

[4 of 4 positions shown; findings below may reference images not displayed]

FINDINGS: There is a fracture through the midpole of the left scaphoid. No
additional fracture. No subluxation or dislocation. Soft tissues are
intact.
IMPRESSION: Nondisplaced fracture through the midpole of the left scaphoid.
